# Patient Record
Sex: Female | Born: 1975 | Race: Black or African American | Hispanic: No | Smoking: Never smoker
Health system: Southern US, Community
[De-identification: ages and names within clinical notes are randomized; demographics above are authoritative.]

## PROBLEM LIST (undated history)

## (undated) DIAGNOSIS — M1990 Unspecified osteoarthritis, unspecified site: Secondary | ICD-10-CM

## (undated) DIAGNOSIS — F329 Major depressive disorder, single episode, unspecified: Secondary | ICD-10-CM

## (undated) DIAGNOSIS — A749 Chlamydial infection, unspecified: Principal | ICD-10-CM

## (undated) DIAGNOSIS — E785 Hyperlipidemia, unspecified: Secondary | ICD-10-CM

## (undated) DIAGNOSIS — I10 Essential (primary) hypertension: Secondary | ICD-10-CM

## (undated) DIAGNOSIS — F419 Anxiety disorder, unspecified: Secondary | ICD-10-CM

## (undated) DIAGNOSIS — E119 Type 2 diabetes mellitus without complications: Secondary | ICD-10-CM

## (undated) DIAGNOSIS — K219 Gastro-esophageal reflux disease without esophagitis: Secondary | ICD-10-CM

## (undated) DIAGNOSIS — F32A Depression, unspecified: Secondary | ICD-10-CM

## (undated) DIAGNOSIS — R51 Headache: Secondary | ICD-10-CM

## (undated) HISTORY — DX: Type 2 diabetes mellitus without complications: E11.9

## (undated) HISTORY — DX: Essential (primary) hypertension: I10

## (undated) HISTORY — PX: BREAST BIOPSY: SHX20

## (undated) HISTORY — PX: BREAST SURGERY: SHX581

## (undated) HISTORY — DX: Chlamydial infection, unspecified: A74.9

## (undated) HISTORY — DX: Hyperlipidemia, unspecified: E78.5

---

## 2001-03-20 ENCOUNTER — Other Ambulatory Visit: Admission: RE | Admit: 2001-03-20 | Discharge: 2001-03-20 | Payer: Self-pay | Admitting: Obstetrics and Gynecology

## 2001-09-10 ENCOUNTER — Ambulatory Visit (HOSPITAL_COMMUNITY): Admission: AD | Admit: 2001-09-10 | Discharge: 2001-09-10 | Payer: Self-pay | Admitting: Obstetrics and Gynecology

## 2001-09-17 ENCOUNTER — Ambulatory Visit (HOSPITAL_COMMUNITY): Admission: AD | Admit: 2001-09-17 | Discharge: 2001-09-17 | Payer: Self-pay | Admitting: Obstetrics and Gynecology

## 2001-09-18 ENCOUNTER — Inpatient Hospital Stay (HOSPITAL_COMMUNITY): Admission: AD | Admit: 2001-09-18 | Discharge: 2001-09-20 | Payer: Self-pay | Admitting: Obstetrics & Gynecology

## 2004-06-26 ENCOUNTER — Ambulatory Visit: Payer: Self-pay | Admitting: Family Medicine

## 2005-08-20 ENCOUNTER — Ambulatory Visit: Payer: Self-pay | Admitting: Family Medicine

## 2005-08-25 ENCOUNTER — Ambulatory Visit: Payer: Self-pay | Admitting: *Deleted

## 2005-09-20 ENCOUNTER — Ambulatory Visit: Payer: Self-pay | Admitting: Cardiology

## 2005-09-20 ENCOUNTER — Ambulatory Visit (HOSPITAL_COMMUNITY): Admission: RE | Admit: 2005-09-20 | Discharge: 2005-09-20 | Payer: Self-pay | Admitting: *Deleted

## 2006-06-29 ENCOUNTER — Other Ambulatory Visit: Admission: RE | Admit: 2006-06-29 | Discharge: 2006-06-29 | Payer: Self-pay | Admitting: Family Medicine

## 2006-06-29 ENCOUNTER — Ambulatory Visit: Payer: Self-pay | Admitting: Family Medicine

## 2006-06-29 ENCOUNTER — Encounter (INDEPENDENT_AMBULATORY_CARE_PROVIDER_SITE_OTHER): Payer: Self-pay | Admitting: Specialist

## 2006-06-30 ENCOUNTER — Encounter: Payer: Self-pay | Admitting: Family Medicine

## 2006-06-30 LAB — CONVERTED CEMR LAB
Candida species: NEGATIVE
Chlamydia, DNA Probe: NEGATIVE
GC Probe Amp, Genital: NEGATIVE
Gardnerella vaginalis: NEGATIVE
Trichomonal Vaginitis: NEGATIVE

## 2006-07-01 ENCOUNTER — Encounter: Payer: Self-pay | Admitting: Family Medicine

## 2006-07-01 LAB — CONVERTED CEMR LAB
ALT: 12 units/L (ref 0–35)
AST: 10 units/L (ref 0–37)
Albumin: 4 g/dL (ref 3.5–5.2)
Alkaline Phosphatase: 58 units/L (ref 39–117)
BUN: 10 mg/dL (ref 6–23)
Bilirubin, Direct: <0.1 mg/dL (ref 0.0–0.3)
CO2: 24 meq/L (ref 19–32)
Calcium: 9 mg/dL (ref 8.4–10.5)
Chloride: 103 meq/L (ref 96–112)
Cholesterol: 224 mg/dL — ABNORMAL HIGH (ref 0–200)
Creatinine, Ser: 0.61 mg/dL (ref 0.40–1.20)
Glucose, Bld: 124 mg/dL — ABNORMAL HIGH (ref 70–99)
HDL: 33 mg/dL — ABNORMAL LOW (ref >39)
Hgb A1c MFr Bld: 8.3 % — ABNORMAL HIGH (ref 4.6–6.1)
LDL Cholesterol: 170 mg/dL — ABNORMAL HIGH (ref 0–99)
Potassium: 4.6 meq/L (ref 3.5–5.3)
Sodium: 136 meq/L (ref 135–145)
Total Bilirubin: 0.4 mg/dL (ref 0.3–1.2)
Total CHOL/HDL Ratio: 6.8
Total Protein: 7.5 g/dL (ref 6.0–8.3)
Triglycerides: 103 mg/dL (ref <150)
VLDL: 21 mg/dL (ref 0–40)

## 2006-08-17 ENCOUNTER — Ambulatory Visit: Payer: Self-pay | Admitting: Family Medicine

## 2006-08-18 ENCOUNTER — Encounter: Payer: Self-pay | Admitting: Family Medicine

## 2006-11-01 ENCOUNTER — Ambulatory Visit: Payer: Self-pay | Admitting: Family Medicine

## 2007-05-11 ENCOUNTER — Encounter: Payer: Self-pay | Admitting: Family Medicine

## 2007-05-31 ENCOUNTER — Ambulatory Visit: Payer: Self-pay | Admitting: Family Medicine

## 2007-06-01 ENCOUNTER — Encounter: Payer: Self-pay | Admitting: Family Medicine

## 2007-06-01 LAB — CONVERTED CEMR LAB: Microalb, Ur: 0.81 mg/dL (ref 0.00–1.89)

## 2008-04-11 ENCOUNTER — Telehealth (INDEPENDENT_AMBULATORY_CARE_PROVIDER_SITE_OTHER): Payer: Self-pay | Admitting: *Deleted

## 2008-05-08 ENCOUNTER — Telehealth: Payer: Self-pay | Admitting: Family Medicine

## 2008-05-08 DIAGNOSIS — R5381 Other malaise: Secondary | ICD-10-CM | POA: Insufficient documentation

## 2008-05-08 DIAGNOSIS — R5383 Other fatigue: Secondary | ICD-10-CM

## 2008-05-14 ENCOUNTER — Ambulatory Visit: Payer: Self-pay | Admitting: Family Medicine

## 2008-05-14 DIAGNOSIS — E1165 Type 2 diabetes mellitus with hyperglycemia: Secondary | ICD-10-CM | POA: Insufficient documentation

## 2008-05-14 LAB — CONVERTED CEMR LAB
Glucose, Bld: 196 mg/dL
Hgb A1c MFr Bld: 11.1 %

## 2008-05-15 LAB — CONVERTED CEMR LAB
ALT: 14 units/L (ref 0–35)
AST: 11 units/L (ref 0–37)
Albumin: 4.1 g/dL (ref 3.5–5.2)
Alkaline Phosphatase: 62 units/L (ref 39–117)
BUN: 7 mg/dL (ref 6–23)
Basophils Absolute: 0 10*3/uL (ref 0.0–0.1)
Basophils Relative: 1 % (ref 0–1)
Bilirubin, Direct: 0.1 mg/dL (ref 0.0–0.3)
CO2: 22 meq/L (ref 19–32)
Calcium: 9.1 mg/dL (ref 8.4–10.5)
Chloride: 102 meq/L (ref 96–112)
Cholesterol: 279 mg/dL — ABNORMAL HIGH (ref 0–200)
Creatinine, Ser: 0.52 mg/dL (ref 0.40–1.20)
Creatinine, Urine: 107.7 mg/dL
Eosinophils Absolute: 0.1 10*3/uL (ref 0.0–0.7)
Eosinophils Relative: 1 % (ref 0–5)
Glucose, Bld: 225 mg/dL — ABNORMAL HIGH (ref 70–99)
HCT: 40 % (ref 36.0–46.0)
HDL: 35 mg/dL — ABNORMAL LOW (ref >39)
Hemoglobin: 13.1 g/dL (ref 12.0–15.0)
Indirect Bilirubin: 0.4 mg/dL (ref 0.0–0.9)
LDL Cholesterol: 210 mg/dL — ABNORMAL HIGH (ref 0–99)
Lymphocytes Relative: 32 % (ref 12–46)
Lymphs Abs: 2.1 10*3/uL (ref 0.7–4.0)
MCHC: 32.8 g/dL (ref 30.0–36.0)
MCV: 79.5 fL (ref 78.0–100.0)
Microalb Creat Ratio: 20.8 mg/g (ref 0.0–30.0)
Microalb, Ur: 2.24 mg/dL — ABNORMAL HIGH (ref 0.00–1.89)
Monocytes Absolute: 0.6 10*3/uL (ref 0.1–1.0)
Monocytes Relative: 9 % (ref 3–12)
Neutro Abs: 3.8 10*3/uL (ref 1.7–7.7)
Neutrophils Relative %: 58 % (ref 43–77)
Platelets: 320 10*3/uL (ref 150–400)
Potassium: 4.1 meq/L (ref 3.5–5.3)
RBC: 5.03 M/uL (ref 3.87–5.11)
RDW: 13.8 % (ref 11.5–15.5)
Sodium: 137 meq/L (ref 135–145)
Total Bilirubin: 0.5 mg/dL (ref 0.3–1.2)
Total CHOL/HDL Ratio: 8
Total Protein: 7.8 g/dL (ref 6.0–8.3)
Triglycerides: 170 mg/dL — ABNORMAL HIGH (ref <150)
VLDL: 34 mg/dL (ref 0–40)
WBC: 6.5 10*3/uL (ref 4.0–10.5)

## 2008-05-19 DIAGNOSIS — E785 Hyperlipidemia, unspecified: Secondary | ICD-10-CM | POA: Insufficient documentation

## 2008-05-19 DIAGNOSIS — I10 Essential (primary) hypertension: Secondary | ICD-10-CM | POA: Insufficient documentation

## 2008-06-25 ENCOUNTER — Encounter: Payer: Self-pay | Admitting: Family Medicine

## 2008-06-25 ENCOUNTER — Ambulatory Visit: Payer: Self-pay | Admitting: Family Medicine

## 2008-06-25 ENCOUNTER — Other Ambulatory Visit: Admission: RE | Admit: 2008-06-25 | Discharge: 2008-06-25 | Payer: Self-pay | Admitting: Family Medicine

## 2008-06-25 LAB — CONVERTED CEMR LAB: Glucose, Bld: 371 mg/dL

## 2008-07-16 ENCOUNTER — Encounter: Payer: Self-pay | Admitting: Family Medicine

## 2008-08-29 ENCOUNTER — Telehealth: Payer: Self-pay | Admitting: Family Medicine

## 2009-03-31 ENCOUNTER — Telehealth: Payer: Self-pay | Admitting: Family Medicine

## 2009-03-31 ENCOUNTER — Ambulatory Visit: Payer: Self-pay | Admitting: Family Medicine

## 2009-03-31 LAB — CONVERTED CEMR LAB: Beta hcg, urine, semiquantitative: NEGATIVE

## 2009-04-22 ENCOUNTER — Telehealth: Payer: Self-pay | Admitting: Family Medicine

## 2009-05-29 ENCOUNTER — Encounter: Payer: Self-pay | Admitting: Family Medicine

## 2009-07-09 ENCOUNTER — Other Ambulatory Visit: Admission: RE | Admit: 2009-07-09 | Discharge: 2009-07-09 | Payer: Self-pay | Admitting: Obstetrics and Gynecology

## 2009-07-09 ENCOUNTER — Encounter: Payer: Self-pay | Admitting: Family Medicine

## 2009-07-24 ENCOUNTER — Encounter: Payer: Self-pay | Admitting: Family Medicine

## 2009-08-18 ENCOUNTER — Telehealth: Payer: Self-pay | Admitting: Family Medicine

## 2009-08-25 ENCOUNTER — Ambulatory Visit: Payer: Self-pay | Admitting: Family Medicine

## 2009-08-25 DIAGNOSIS — F411 Generalized anxiety disorder: Secondary | ICD-10-CM | POA: Insufficient documentation

## 2009-08-25 DIAGNOSIS — R42 Dizziness and giddiness: Secondary | ICD-10-CM | POA: Insufficient documentation

## 2009-11-14 ENCOUNTER — Encounter: Payer: Self-pay | Admitting: Family Medicine

## 2010-01-11 ENCOUNTER — Inpatient Hospital Stay (HOSPITAL_COMMUNITY): Admission: AD | Admit: 2010-01-11 | Discharge: 2010-01-11 | Payer: Self-pay | Admitting: Obstetrics & Gynecology

## 2010-01-11 ENCOUNTER — Ambulatory Visit: Payer: Self-pay | Admitting: Advanced Practice Midwife

## 2010-05-16 ENCOUNTER — Observation Stay (HOSPITAL_COMMUNITY)
Admission: AD | Admit: 2010-05-16 | Discharge: 2010-05-17 | Payer: Self-pay | Source: Home / Self Care | Attending: Obstetrics and Gynecology | Admitting: Obstetrics and Gynecology

## 2010-05-17 ENCOUNTER — Inpatient Hospital Stay (HOSPITAL_COMMUNITY)
Admission: AD | Admit: 2010-05-17 | Discharge: 2010-05-20 | Payer: Self-pay | Source: Home / Self Care | Attending: Obstetrics & Gynecology | Admitting: Obstetrics & Gynecology

## 2010-05-25 LAB — CBC
HCT: 30.5 % — ABNORMAL LOW (ref 36.0–46.0)
HCT: 30.6 % — ABNORMAL LOW (ref 36.0–46.0)
HCT: 30.3 % — ABNORMAL LOW (ref 36.0–46.0)
Hemoglobin: 10.2 g/dL — ABNORMAL LOW (ref 12.0–15.0)
Hemoglobin: 10.4 g/dL — ABNORMAL LOW (ref 12.0–15.0)
Hemoglobin: 10.1 g/dL — ABNORMAL LOW (ref 12.0–15.0)
MCH: 26.2 pg (ref 26.0–34.0)
MCH: 26.6 pg (ref 26.0–34.0)
MCH: 26.5 pg (ref 26.0–34.0)
MCHC: 33.4 g/dL (ref 30.0–36.0)
MCHC: 34 g/dL (ref 30.0–36.0)
MCHC: 33.3 g/dL (ref 30.0–36.0)
MCV: 78.2 fL (ref 78.0–100.0)
MCV: 78.3 fL (ref 78.0–100.0)
MCV: 79.5 fL (ref 78.0–100.0)
Platelets: 314 10*3/uL (ref 150–400)
Platelets: 316 10*3/uL (ref 150–400)
Platelets: 318 10*3/uL (ref 150–400)
RBC: 3.9 MIL/uL (ref 3.87–5.11)
RBC: 3.91 MIL/uL (ref 3.87–5.11)
RBC: 3.81 MIL/uL — ABNORMAL LOW (ref 3.87–5.11)
RDW: 15.2 % (ref 11.5–15.5)
RDW: 15.3 % (ref 11.5–15.5)
RDW: 15.5 % (ref 11.5–15.5)
WBC: 11.6 10*3/uL — ABNORMAL HIGH (ref 4.0–10.5)
WBC: 8.3 10*3/uL (ref 4.0–10.5)
WBC: 11 10*3/uL — ABNORMAL HIGH (ref 4.0–10.5)

## 2010-05-25 LAB — GLUCOSE, CAPILLARY
Glucose-Capillary: 134 mg/dL — ABNORMAL HIGH (ref 70–99)
Glucose-Capillary: 134 mg/dL — ABNORMAL HIGH (ref 70–99)
Glucose-Capillary: 137 mg/dL — ABNORMAL HIGH (ref 70–99)
Glucose-Capillary: 160 mg/dL — ABNORMAL HIGH (ref 70–99)
Glucose-Capillary: 168 mg/dL — ABNORMAL HIGH (ref 70–99)
Glucose-Capillary: 186 mg/dL — ABNORMAL HIGH (ref 70–99)
Glucose-Capillary: 191 mg/dL — ABNORMAL HIGH (ref 70–99)
Glucose-Capillary: 196 mg/dL — ABNORMAL HIGH (ref 70–99)
Glucose-Capillary: 271 mg/dL — ABNORMAL HIGH (ref 70–99)
Glucose-Capillary: 77 mg/dL (ref 70–99)
Glucose-Capillary: 92 mg/dL (ref 70–99)
Glucose-Capillary: 93 mg/dL (ref 70–99)
Glucose-Capillary: 125 mg/dL — ABNORMAL HIGH (ref 70–99)

## 2010-05-25 LAB — RPR
RPR Ser Ql: NONREACTIVE
RPR Ser Ql: NONREACTIVE

## 2010-05-30 NOTE — Discharge Summary (Signed)
  NAMEVETRA, SHINALL NO.:  [id number]  MEDICAL RECORD NO.:  [id number]          PATIENT TYPE:  OBV  LOCATION:  9168                          FACILITY:  [facility]  PHYSICIAN:  Tilda Burrow, M.D. DATE OF BIRTH:  12-17-1975  DATE OF ADMISSION:  05/16/2010 DATE OF DISCHARGE:  05/18/2010                              DISCHARGE SUMMARY   ADMITTING DIAGNOSIS:  Pregnancy at 37 weeks 3 days, active labor, class B gestational diabetes mellitus on glyburide, group B strep negative status.  DISCHARGE DIAGNOSIS:  Pregnancy at 37 weeks 3 days, false labor.  HOSPITAL SUMMARY:  This 35 year old female was admitted on May 16, 2010, for a suspected labor with cervix 4 cm, 20%, posterior, and -2, shortly before midnight on the evening of May 16, 2010.  She had had fetal heart acceleration pattern that was reactive with the exception of a couple of areas that were interpreted initially as variable decelerations.  Her prenatal course has been followed at [facility] for prenatal management.  HOSPITAL COURSE:  The patient was admitted, had blood sugars in the 130 range with hemoglobin 10, hematocrit 30, platelets 316,000, RPR nonreactive.  She was admitted for labor management.  She was kept for a total of approximately 10 hours during which time the cervix did not change any further and the contractions spaced out.  Following morning, she was reexamined 3 hours apart by myself and had unchanged cervix. She was therefore discharged home for followup later when contractions had increased.  Routine discharge instructions include resume glyburide as previously ordered.  Follow up in 3 days for nonstress test at [facility].  ADDENDUM:  The patient went home, had progression of labor symptoms and returned later that same day in more active labor.     Tilda Burrow, M.D.     JVF/MEDQ  D:  05/28/2010  T:  05/29/2010  Job:  [id number]  cc:   [facility]  Electronically Signed by Christin Bach M.D. on 05/30/2010 06:56:19 AM

## 2010-06-09 NOTE — Letter (Signed)
Summary: history and physical  history and physical   Imported By: Curtis Sites 10/29/2009 11:44:23  _____________________________________________________________________  External Attachment:    Type:   Image     Comment:   External Document

## 2010-06-09 NOTE — Progress Notes (Signed)
Summary: PATIENT VISIT/MEDICATION SUMMARY [facility]  PATIENT VISIT/MEDICATION SUMMARY [facility]   Imported By: Lind Guest 11/18/2009 10:21:55  _____________________________________________________________________  External Attachment:    Type:   Image     Comment:   External Document

## 2010-06-09 NOTE — Progress Notes (Signed)
  Phone Note From Pharmacy   Caller: [facility]  [address] Summary of Call: refill request for metformin 1000 but not on med list, should patient still be on this? Initial call taken by: Adella Hare LPN,  August 18, 2009 4:51 PM  Follow-up for Phone Call        advise script sent in  Follow-up by: Syliva Overman MD,  August 18, 2009 5:05 PM    New/Updated Medications: METFORMIN HCL 1000 MG TABS (METFORMIN HCL) Take 1 tablet by mouth two times a day Prescriptions: METFORMIN HCL 1000 MG TABS (METFORMIN HCL) Take 1 tablet by mouth two times a day  #60 x 4   Entered and Authorized by:   Syliva Overman MD   Signed by:   Syliva Overman MD on 08/18/2009   Method used:   Electronically to        [facility]  [address] (retail)       [address]       [place], [place]  [address]       Ph: [phone]       Fax: [phone]   RxID:   [id number]

## 2010-06-09 NOTE — Letter (Signed)
Summary: misc.  misc.   Imported By: Curtis Sites 10/29/2009 11:44:54  _____________________________________________________________________  External Attachment:    Type:   Image     Comment:   External Document

## 2010-06-09 NOTE — Letter (Signed)
Summary: xray  xray   Imported By: Curtis Sites 10/29/2009 11:45:43  _____________________________________________________________________  External Attachment:    Type:   Image     Comment:   External Document

## 2010-06-09 NOTE — Progress Notes (Signed)
Summary: [facility]   Imported By: Lind Guest 07/17/2009 13:58:18  _____________________________________________________________________  External Attachment:    Type:   Image     Comment:   External Document

## 2010-06-09 NOTE — Letter (Signed)
Summary: consult  consult   Imported By: Curtis Sites 10/29/2009 11:43:28  _____________________________________________________________________  External Attachment:    Type:   Image     Comment:   External Document

## 2010-06-09 NOTE — Letter (Signed)
Summary: labs  labs   Imported By: Curtis Sites 10/29/2009 11:44:39  _____________________________________________________________________  External Attachment:    Type:   Image     Comment:   External Document

## 2010-06-09 NOTE — Progress Notes (Signed)
Summary: [facility]   Imported By: Lind Guest 07/30/2009 11:42:20  _____________________________________________________________________  External Attachment:    Type:   Image     Comment:   External Document

## 2010-06-09 NOTE — Letter (Signed)
Summary: phone notes  phone notes   Imported By: Curtis Sites 10/29/2009 11:45:09  _____________________________________________________________________  External Attachment:    Type:   Image     Comment:   External Document

## 2010-06-09 NOTE — Letter (Signed)
Summary: demographic  demographic   Imported By: Curtis Sites 10/29/2009 11:43:43  _____________________________________________________________________  External Attachment:    Type:   Image     Comment:   External Document

## 2010-06-09 NOTE — Assessment & Plan Note (Signed)
Summary: office visit   Vital Signs:  Patient profile:   35 year old female Height:      60 inches Weight:      134.25 pounds BMI:     26.31 O2 Sat:      98 % on Room air Pulse rate:   95 / minute BP sitting:   120 / 78 CC: needs a refill on her prescription, metformin Is Patient Diabetic? Yes Did you bring your meter with you today? Yes   CC:  needs a refill on her prescription and metformin.  History of Present Illness: Hx of DM.  Not taking medications as prescribed. No episodes of hypoglycemia. Not checking blood sugar at home regularly.  Did bring her meter today though & FBS range 80's to 130's. Last eye exam last summer No hx of Pneumovax. She states her last Hbg A1C with OB/GYN in March was 8.5.  This is lower than what it was previously, but she admits that she wasn't taking her meds regularly.  Has been out of Metformin for the last wk & needs this refilled today.  Pt had a miscarriage in Jan 2011.  Is seeing OB/GYN.  Wants to try to conceive again.  They changed her BP med to Norvasc.  BP has been controlled on it. No headache, chest pain or palpitations.  States she sometimes feels like she might pass out. States that she gets "a heaviness" that lasts for a few minutes then goes away.  No vertigo or dizziness, uncertain if lightheaded or not.  Doesn't notice with any certain activity, time of day,eating, change of positions etc. Has happened a few times since her miscarriage.  The first time she noticed it was after she took Cytotec for the miscarriage.  Allergies: No Known Drug Allergies  Past History:  Past medical, surgical, family and social histories (including risk factors) reviewed, and no changes noted (except as noted below).  Past Medical History: Diabetes mellitus, type II Hyperlipidemia Hypertension Spontaneous AB Jan 2011 Sickle cell trait Anxiety  Past Surgical History: Rt breast lumpectomy 2000  Family History: Reviewed history and no  changes required. Mom - DM, HTN, depression, Deceased 30 yo Dad - uncertain 1 brother - sickle cell disease 1 sister - L & W  Social History: Reviewed history and no changes required. Occupation: Charity fundraiser at [employer] Married Never Smoked Occupation:  employed  Review of Systems General:  Denies fatigue. Eyes:  Denies blurring and double vision. CV:  Complains of chest pain or discomfort and palpitations; CHEST DISCOMFORT & PALPITATIONS WITH ANXIETY.  HAS HAD NEG CST IN PAST FOR SAME SXS.Marland Kitchen Resp:  Denies shortness of breath.  Physical Exam  General:  Well-developed,well-nourished,in no acute distress; alert,appropriate and cooperative throughout examination Head:  Normocephalic and atraumatic without obvious abnormalities. No apparent alopecia or balding. Ears:  External ear exam shows no significant lesions or deformities.  Otoscopic examination reveals clear canals, tympanic membranes are intact bilaterally without bulging, retraction, inflammation or discharge. Hearing is grossly normal bilaterally. Nose:  External nasal examination shows no deformity or inflammation. Nasal mucosa are pink and moist without lesions or exudates. Mouth:  Oral mucosa and oropharynx without lesions or exudates.  Teeth in good repair. Neck:  No deformities, masses, or tenderness noted. Lungs:  Normal respiratory effort, chest expands symmetrically. Lungs are clear to auscultation, no crackles or wheezes. Heart:  Normal rate and regular rhythm. S1 and S2 normal without gallop, murmur, click, rub or other extra sounds. Cervical Nodes:  No lymphadenopathy noted Psych:  Oriented X3, normally interactive, good eye contact, and slightly anxious.     Impression & Recommendations:  Problem # 1:  DIABETES MELLITUS, TYPE II (ICD-250.00)  The following medications were removed from the medication list:    Lisinopril 20 Mg Tabs (Lisinopril) ..... One tab by mouth qd Her updated medication list for this problem  includes:    Glipizide 10 Mg Tabs (Glipizide) .Marland Kitchen... Take 1 tablet by mouth two times a day    Metformin Hcl 1000 Mg Tabs (Metformin hcl) .Marland Kitchen... Take 1 tablet by mouth two times a day  Orders: T-Comprehensive Metabolic Panel ([id number])  Problem # 2:  HYPERTENSION (ICD-401.9) Assessment: Comment Only  The following medications were removed from the medication list:    Lisinopril 20 Mg Tabs (Lisinopril) ..... One tab by mouth qd Her updated medication list for this problem includes:    Amlodipine Besylate 5 Mg Tabs (Amlodipine besylate) .Marland Kitchen... Take 1 daily  BP today: 120/78 Prior BP: 100/68 (06/25/2008)  Labs Reviewed: K+: 4.1 (05/14/2008) Creat: : 0.52 (05/14/2008)   Chol: 279 (05/14/2008)   HDL: 35 (05/14/2008)   LDL: 210 (05/14/2008)   TG: 170 (05/14/2008)  Orders: T-Comprehensive Metabolic Panel ([id number])  Problem # 3:  HYPERLIPIDEMIA (ICD-272.4) Assessment: Comment Only  Her updated medication list for this problem includes:    Lovastatin 40 Mg Tabs (Lovastatin) .Marland Kitchen... Take two tabs by mouth at bedtime  Labs Reviewed: SGOT: 11 (05/14/2008)   SGPT: 14 (05/14/2008)   HDL:35 (05/14/2008), 33 (07/01/2006)  LDL:210 (05/14/2008), 170 (07/01/2006)  Chol:279 (05/14/2008), 224 (07/01/2006)  Trig:170 (05/14/2008), 103 (07/01/2006)  Orders: T-Comprehensive Metabolic Panel ([id number]) T-Lipid Profile ([id number])  Problem # 4:  ANXIETY (ICD-300.00) Assessment: Unchanged  Complete Medication List: 1)  Lovastatin 40 Mg Tabs (Lovastatin) .... Take two tabs by mouth at bedtime 2)  Glipizide 10 Mg Tabs (Glipizide) .... Take 1 tablet by mouth two times a day 3)  Accu-chek Aviva Strp (Glucose blood) .... Uad 4)  Accu-chek Multiclix Lancets Misc (Lancets) .... Uad 5)  Metformin Hcl 1000 Mg Tabs (Metformin hcl) .... Take 1 tablet by mouth two times a day 6)  Amlodipine Besylate 5 Mg Tabs (Amlodipine besylate) .... Take 1 daily  Other Orders: T-CBC No Diff ([id number]) T-TSH  [phone]) T-Vitamin D (25-Hydroxy) [phone]) Pneumococcal Vaccine ([address]) Admin 1st Vaccine (30160)  Patient Instructions: 1)  Please schedule a follow-up appointment in 3 months. 2)  Check your blood sugars regularly. If your readings are usually above : or below 70 you should contact our office. Prescriptions: METFORMIN HCL 1000 MG TABS (METFORMIN HCL) Take 1 tablet by mouth two times a day  #60 x 3   Entered and Authorized by:   Esperanza Sheets PA   Signed by:   Esperanza Sheets PA on 08/25/2009   Method used:   Electronically to        [facility]  [address] (retail)       [address]       [place], [place]  [address]       Ph: [phone]       Fax: [phone]   RxID:   [id number]    Immunizations Administered:  Pneumonia Vaccine:    Vaccine Type: Pneumovax    Site: left deltoid    Mfr: Merck    Dose: 0.5 ml    Route: IM    Given by: Adella Hare LPN    Exp. Date: 06/05/2010  Lot #: [id number]    VIS given: 12/06/95 version given August 25, 2009.

## 2010-06-09 NOTE — Letter (Signed)
Summary: progress notes  progress notes   Imported By: Curtis Sites 10/29/2009 11:45:24  _____________________________________________________________________  External Attachment:    Type:   Image     Comment:   External Document

## 2010-06-09 NOTE — Progress Notes (Signed)
Summary: [facility]   Imported By: Lind Guest 07/15/2009 08:38:16  _____________________________________________________________________  External Attachment:    Type:   Image     Comment:   External Document

## 2010-06-09 NOTE — Assessment & Plan Note (Signed)
Summary: PER DR Jamal Pavon   Allergies: No Known Drug Allergies   Complete Medication List: 1)  Lisinopril 20 Mg Tabs (Lisinopril) .... One tab by mouth qd 2)  Lovastatin 40 Mg Tabs (Lovastatin) .... Take two tabs by mouth at bedtime 3)  Glipizide 10 Mg Tabs (Glipizide) .... Take 1 tablet by mouth two times a day 4)  Accu-chek Aviva Strp (Glucose blood) .... Uad 5)  Accu-chek Multiclix Lancets Misc (Lancets) .... Uad pt left without being seen, and rescheduled

## 2010-07-23 LAB — URINALYSIS, ROUTINE W REFLEX MICROSCOPIC
Bilirubin Urine: NEGATIVE
Glucose, UA: 250 mg/dL — AB
Hgb urine dipstick: NEGATIVE
Ketones, ur: NEGATIVE mg/dL
Nitrite: NEGATIVE
Protein, ur: NEGATIVE mg/dL
Specific Gravity, Urine: 1.02 (ref 1.005–1.030)
Urobilinogen, UA: 0.2 mg/dL (ref 0.0–1.0)
pH: 6 (ref 5.0–8.0)

## 2010-07-23 LAB — WET PREP, GENITAL
Clue Cells Wet Prep HPF POC: NONE SEEN
Trich, Wet Prep: NONE SEEN
Yeast Wet Prep HPF POC: NONE SEEN

## 2010-07-23 LAB — GC/CHLAMYDIA PROBE AMP, GENITAL
Chlamydia, DNA Probe: NEGATIVE
GC Probe Amp, Genital: NEGATIVE

## 2010-09-25 NOTE — Procedures (Signed)
NAMEPETINA, MURASKI NO.:  [id number]   MEDICAL RECORD NO.:  [id number]          PATIENT TYPE:  OUT   LOCATION:  RAD                           [facility]ILITY[facility]   PHYSICIAN:  [place] Bing, M.D. [facility] OF BIRTH:  1975-06-25   DATE OF PROCEDURE:  09/20/2005  DATE OF DISCHARGE:                                  ECHOCARDIOGRAM   REFERRING:  Dr. Lodema Hong and Dr. Dorethea Clan.   CLINICAL DATA:  A 35 year old woman with diabetes and chest pain.   1.  Treadmill exercise performed to a workload of 10 METs and a heart rate      of 172, 89% of age-predicted maximum.  Exercise discontinued due to      fatigue; no chest pain reported.  2.  Blood pressure increased from a resting value of 120/60 to 180/60 at      peak exercise, a normal response.  3.  No arrhythmias noted.  4.  Baseline EKG:  Normal sinus rhythm; prominent voltage; slightly delayed      R-wave progression; minimal nonspecific T-wave abnormality.  5.  Exercise EKG:  Interpretation impaired by artifact; only insignificant      ST-segment depression identified.  6.  Resting echocardiogram:  Normal left ventricular size; normal regional      and global function; no valvular abnormalities.  7.  Post-stress echocardiogram:  Normal to hyperdynamic function in all      myocardial segments.   IMPRESSION:  Negative stress echocardiogram revealing adequate exercise  tolerance, a normal stress EKG, normal left ventricular size and normal  augmentation in left ventricular systolic function following exercise.  Other findings as noted.       Bing, M.D. [facility]  Electronically Signed     RR/MEDQ  D:  09/20/2005  T:  09/21/2005  Job:  [id number]

## 2010-09-25 NOTE — Op Note (Signed)
[facility], [place]  Patient:    CING, [place] Visit Number: [id number] MRN: [id number]          Service Type: OBS Location: 4A A416 01 Attending Physician:  Lazaro Arms Dictated by:   Zerita Boers, C.N.M. Proc. Date: 09/18/01 Admit Date:  09/18/2001   CC:         [facility]   Operative Report  ONSET OF LABOR:  Sep 18, 2001 at 12 p.m.  DATE OF DELIVERY:  Sep 18, 2001 at 2:31 p.m.  LENGTH OF FIRST STAGE LABOR:  Two hours and 16 minutes.  LENGTH OF SECOND STAGE LABOR:  Sixteen minutes.  LENGTH OF THIRD STAGE LABOR:  Four minutes.  DELIVERY NOTE:  Wandra Mannan had a spontaneous vaginal delivery of a viable female child, weighing 5 pounds 2 ounces.  Upon delivery of head an occult arm was noted, which was reduced, and delivery of the posterior shoulder was accomplished without any difficulty, with spontaneous delivery of anterior shoulder and the rest of the fetal body without any complications.  Upon delivery the infant was alert, vigorous, had good tone.  Nose and mouth were thoroughly suctioned with a bulb syringe and vigorous cry was noted.  The cord was clamped and cut and the infant placed on the mothers abdomen in good condition.  Upon inspection intact perineum was noted.  Third stage of labor was actively managed with 20 units of Pitocin at 1000 cc with D-5 LR at a rapid rate.  Estimated blood loss was approximately 250 cc.  The placenta was delivered spontaneously via Schultze mechanism, three-vessel cord.  Infant and mother both stabilized and transferred out to the postpartum unit in stable condition. Dictated by:   Zerita Boers, C.N.M. Attending Physician:  Lazaro Arms DD:  09/18/01 TD:  09/18/01 Job: [id number]

## 2010-09-25 NOTE — Procedures (Signed)
NAMEHOPELYNN, GARTLAND NO.:  [id number]   MEDICAL RECORD NO.:  [id number]          PATIENT TYPE:  OUT   LOCATION:  RAD                           [facility]ILITY[facility]   PHYSICIAN:  [place] Bing, M.D. [facility] OF BIRTH:  July 09, 1975   DATE OF PROCEDURE:  09/20/2005  DATE OF DISCHARGE:                                  ECHOCARDIOGRAM   PROCEDURE:  Echocardiogram.   CARDIOLOGIST:  [place] Bing, M.D.   CLINICAL DATA:  A 36 year old woman with diabetes and chest pain.   MEASUREMENTS:  M-mode aorta 2.5, left atrium 3.9, septum 1.2, posterior wall  1.0, LV diastole 4.5, LV systole 3.1.   IMPRESSION:  1.  Technically adequate echocardiographic study.  2.  Normal left atrium, right atrium and right ventricle.  3.  Normal aortic, mitral, tricuspid and pulmonic valve; normal proximal      pulmonary artery.  4.  Normal Doppler study with physiologic tricuspid regurgitation and normal      estimated right ventricular systolic pressure.  5.  Normal internal dimension, wall thickness, regional and global function      of the left ventricle.  6.  Normal inferior vena cava.      [place] Bing, M.D. [facility]  Electronically Signed     RR/MEDQ  D:  09/20/2005  T:  09/21/2005  Job:  [id number]

## 2011-06-16 ENCOUNTER — Ambulatory Visit (INDEPENDENT_AMBULATORY_CARE_PROVIDER_SITE_OTHER): Payer: 59 | Admitting: Family Medicine

## 2011-06-16 ENCOUNTER — Encounter: Payer: Self-pay | Admitting: Family Medicine

## 2011-06-16 VITALS — BP 140/90 | HR 82 | Resp 18 | Ht 60.0 in | Wt 127.0 lb

## 2011-06-16 DIAGNOSIS — R1013 Epigastric pain: Secondary | ICD-10-CM | POA: Insufficient documentation

## 2011-06-16 DIAGNOSIS — E119 Type 2 diabetes mellitus without complications: Secondary | ICD-10-CM

## 2011-06-16 DIAGNOSIS — E559 Vitamin D deficiency, unspecified: Secondary | ICD-10-CM

## 2011-06-16 DIAGNOSIS — E785 Hyperlipidemia, unspecified: Secondary | ICD-10-CM

## 2011-06-16 DIAGNOSIS — I10 Essential (primary) hypertension: Secondary | ICD-10-CM

## 2011-06-16 DIAGNOSIS — F411 Generalized anxiety disorder: Secondary | ICD-10-CM

## 2011-06-16 DIAGNOSIS — K3189 Other diseases of stomach and duodenum: Secondary | ICD-10-CM

## 2011-06-16 DIAGNOSIS — N760 Acute vaginitis: Secondary | ICD-10-CM

## 2011-06-16 DIAGNOSIS — N3090 Cystitis, unspecified without hematuria: Secondary | ICD-10-CM

## 2011-06-16 DIAGNOSIS — N3000 Acute cystitis without hematuria: Secondary | ICD-10-CM

## 2011-06-16 LAB — POCT URINALYSIS DIPSTICK
Bilirubin, UA: NEGATIVE
Blood, UA: NEGATIVE
Glucose, UA: NEGATIVE
Ketones, UA: NEGATIVE
Leukocytes, UA: NEGATIVE
Nitrite, UA: NEGATIVE
Protein, UA: NEGATIVE
Spec Grav, UA: 1.02
Urobilinogen, UA: 0.2
pH, UA: 6

## 2011-06-16 MED ORDER — LISINOPRIL 10 MG PO TABS: 10.0000 mg | ORAL_TABLET | Freq: Every day | ORAL | Status: DC

## 2011-06-16 NOTE — Assessment & Plan Note (Signed)
Reports increased anxiety, check TSH

## 2011-06-16 NOTE — Progress Notes (Signed)
  Subjective:    Patient ID: [name] Fernandez, female    DOB: 11-24-1975, 36 y.o.   MRN: [id number]  HPI The PT is here for follow up and re-evaluation of chronic medical conditions, medication management and review of any available recent lab and radiology data.  Preventive health is updated, specifically  Cancer screening and Immunization.   4 week h/o recurrent abdominal pain and nausea, feels as though she will pass out at imes, pain radiates to mid back. Has not been taking her regular blood pressure or blood sugar medication. 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 C/o suprapubic discomfort and increased malodorous vaginal d/c x 1 week  Review of Systems See HPI Denies recent fever or chills. Denies sinus pressure, nasal congestion, ear pain or sore throat. Denies chest congestion, productive cough or wheezing. Denies chest pains, palpitations and leg swelling Denies diarrhea or constipation.   Denies  hesitancy or incontinence. Denies joint pain, swelling and limitation in mobility. Denies headaches, seizures, numbness, or tingling.  Denies skin break down or rash.        Objective:   Physical Exam Patient alert and oriented and in no cardiopulmonary distress.  HEENT: No facial asymmetry, EOMI, no sinus tenderness,  oropharynx pink and moist.  Neck supple no adenopathy.  Chest: Clear to auscultation bilaterally.  CVS: S1, S2 no murmurs, no S3.  ABD: Soft , suprapubic tenderness. Bowel sounds normal. Genital: white  vag d/c , no adnexal or cervical motion tenderness Ext: No edema  MS: Adequate ROM spine, shoulders, hips and knees.  Skin: Intact, no ulcerations or rash noted.  Psych: Good eye contact, normal affect. Memory intact mildly  anxious not depressed appearing.  CNS: CN 2-12 intact, power, tone and sensation normal throughout.          Assessment & Plan:

## 2011-06-16 NOTE — Assessment & Plan Note (Signed)
Need fasting labs.

## 2011-06-16 NOTE — Assessment & Plan Note (Signed)
Uncontrolled, will treat based on labs

## 2011-06-16 NOTE — Patient Instructions (Signed)
F/u in 2 month  Labs as soon as possible after 8 hour fast is fine.  Med sent in for blood pressure and kidney protection.   You are referred for an [place] of your gall bladder to eval fior gallstones  Goal for fasting blood sugar ranges from 80 to 120 and 2 hours after any meal or at bedtime should be between 130 to 170.   Test once daily please

## 2011-06-16 NOTE — Assessment & Plan Note (Signed)
Uncontroled, resume meds

## 2011-06-17 DIAGNOSIS — N760 Acute vaginitis: Secondary | ICD-10-CM | POA: Insufficient documentation

## 2011-06-17 HISTORY — DX: Acute vaginitis: N76.0

## 2011-06-17 LAB — LIPID PANEL
Cholesterol: 236 mg/dL — ABNORMAL HIGH (ref 0–200)
VLDL: 33 mg/dL (ref 0–40)

## 2011-06-17 LAB — GC/CHLAMYDIA PROBE AMP, GENITAL
Chlamydia, DNA Probe: NEGATIVE
GC Probe Amp, Genital: NEGATIVE

## 2011-06-17 LAB — CBC
HCT: 38.4 % (ref 36.0–46.0)
Hemoglobin: 13 g/dL (ref 12.0–15.0)
MCV: 80.5 fL (ref 78.0–100.0)
RBC: 4.77 MIL/uL (ref 3.87–5.11)
WBC: 6 10*3/uL (ref 4.0–10.5)

## 2011-06-17 LAB — MICROALBUMIN / CREATININE URINE RATIO
Creatinine, Urine: 93.9 mg/dL
Microalb Creat Ratio: 45.2 mg/g — ABNORMAL HIGH (ref 0.0–30.0)
Microalb, Ur: 4.24 mg/dL — ABNORMAL HIGH (ref 0.00–1.89)

## 2011-06-17 LAB — WET PREP BY MOLECULAR PROBE: Candida species: POSITIVE — AB

## 2011-06-17 MED ORDER — FLUCONAZOLE 150 MG PO TABS: ORAL_TABLET | ORAL | Status: AC

## 2011-06-17 MED ORDER — METRONIDAZOLE 500 MG PO TABS: 500.0000 mg | ORAL_TABLET | Freq: Two times a day (BID) | ORAL | Status: AC

## 2011-06-18 ENCOUNTER — Telehealth: Payer: Self-pay | Admitting: Family Medicine

## 2011-06-18 ENCOUNTER — Other Ambulatory Visit: Payer: Self-pay | Admitting: Family Medicine

## 2011-06-18 DIAGNOSIS — E559 Vitamin D deficiency, unspecified: Secondary | ICD-10-CM | POA: Insufficient documentation

## 2011-06-18 LAB — COMPLETE METABOLIC PANEL WITH GFR
AST: 12 U/L (ref 0–37)
Albumin: 4.2 g/dL (ref 3.5–5.2)
BUN: 9 mg/dL (ref 6–23)
Calcium: 9.5 mg/dL (ref 8.4–10.5)
Chloride: 103 mEq/L (ref 96–112)
Glucose, Bld: 153 mg/dL — ABNORMAL HIGH (ref 70–99)
Potassium: 4.7 mEq/L (ref 3.5–5.3)
Sodium: 139 mEq/L (ref 135–145)
Total Protein: 7.5 g/dL (ref 6.0–8.3)

## 2011-06-18 LAB — HEMOGLOBIN A1C
Hgb A1c MFr Bld: 7.8 % — ABNORMAL HIGH (ref <5.7)
Mean Plasma Glucose: 177 mg/dL — ABNORMAL HIGH (ref <117)

## 2011-06-18 LAB — TSH: TSH: 1.225 u[IU]/mL (ref 0.350–4.500)

## 2011-06-18 LAB — VITAMIN D 25 HYDROXY (VIT D DEFICIENCY, FRACTURES): Vit D, 25-Hydroxy: 18 ng/mL — ABNORMAL LOW (ref 30–89)

## 2011-06-18 MED ORDER — PRAVASTATIN SODIUM 40 MG PO TABS: 40.0000 mg | ORAL_TABLET | Freq: Every evening | ORAL | Status: DC

## 2011-06-18 MED ORDER — SITAGLIPTIN PHOS-METFORMIN HCL 50-500 MG PO TABS: 1.0000 | ORAL_TABLET | Freq: Two times a day (BID) | ORAL | Status: DC

## 2011-06-18 MED ORDER — AMOXICILL-CLARITHRO-LANSOPRAZ PO MISC: Freq: Two times a day (BID) | ORAL | Status: DC

## 2011-06-18 MED ORDER — VITAMIN D3 1.25 MG (50000 UT) PO CAPS: 50000.0000 [IU] | ORAL_CAPSULE | ORAL | Status: DC

## 2011-06-18 NOTE — Telephone Encounter (Signed)
Pt does not need to fill flagyll separately, it is in the prevpac, pls send d/c note to the pharmacy and also explain to the pt when you do spk to her, thanks

## 2011-06-21 ENCOUNTER — Telehealth: Payer: Self-pay | Admitting: Family Medicine

## 2011-06-21 ENCOUNTER — Ambulatory Visit (HOSPITAL_COMMUNITY): Payer: 59

## 2011-06-21 NOTE — Telephone Encounter (Signed)
Pt aware.

## 2011-06-21 NOTE — Telephone Encounter (Signed)
Pt aware of new meds and lab results.

## 2011-06-23 ENCOUNTER — Ambulatory Visit (INDEPENDENT_AMBULATORY_CARE_PROVIDER_SITE_OTHER): Payer: 59

## 2011-06-23 VITALS — BP 130/74 | Wt 128.0 lb

## 2011-06-23 DIAGNOSIS — Z23 Encounter for immunization: Secondary | ICD-10-CM

## 2011-06-23 NOTE — Progress Notes (Signed)
Pt in for tdap immunization.  Given in left deltoid.  No signs or symptoms of adverse reaction. No voiced complaints.

## 2011-06-26 ENCOUNTER — Telehealth: Payer: Self-pay | Admitting: Family Medicine

## 2011-06-26 DIAGNOSIS — E119 Type 2 diabetes mellitus without complications: Secondary | ICD-10-CM

## 2011-06-26 MED ORDER — SITAGLIPTIN PHOS-METFORMIN HCL 50-500 MG PO TABS: 1.0000 | ORAL_TABLET | Freq: Two times a day (BID) | ORAL | Status: DC

## 2011-06-26 MED ORDER — LANSOPRAZOLE 30 MG PO CPDR: 30.0000 mg | DELAYED_RELEASE_CAPSULE | Freq: Two times a day (BID) | ORAL | Status: DC

## 2011-06-26 MED ORDER — AMOXICILLIN 500 MG PO CAPS: 1000.0000 mg | ORAL_CAPSULE | Freq: Two times a day (BID) | ORAL | Status: AC

## 2011-06-26 MED ORDER — CLARITHROMYCIN 500 MG PO TABS: 500.0000 mg | ORAL_TABLET | Freq: Two times a day (BID) | ORAL | Status: AC

## 2011-06-26 NOTE — Telephone Encounter (Signed)
Pt unable to afford prevpak, will dispense separtely

## 2011-06-27 DIAGNOSIS — N3000 Acute cystitis without hematuria: Secondary | ICD-10-CM | POA: Insufficient documentation

## 2011-06-27 NOTE — Assessment & Plan Note (Signed)
Labs after visit show marked deficiency, pt aware and will start weekly meds

## 2011-06-27 NOTE — Assessment & Plan Note (Signed)
ccua abnormal and specimen sent for c/s

## 2011-06-27 NOTE — Assessment & Plan Note (Signed)
Specimen sent will treat based on lab

## 2011-06-30 ENCOUNTER — Telehealth: Payer: Self-pay | Admitting: Family Medicine

## 2011-07-02 ENCOUNTER — Other Ambulatory Visit: Payer: Self-pay

## 2011-07-02 ENCOUNTER — Emergency Department (HOSPITAL_COMMUNITY)
Admission: EM | Admit: 2011-07-02 | Discharge: 2011-07-02 | Disposition: A | Discharge status: Home / Self Care | Payer: 59 | Attending: Emergency Medicine | Admitting: Emergency Medicine

## 2011-07-02 ENCOUNTER — Emergency Department (HOSPITAL_COMMUNITY): Payer: 59

## 2011-07-02 ENCOUNTER — Encounter (HOSPITAL_COMMUNITY): Payer: Self-pay | Admitting: Emergency Medicine

## 2011-07-02 DIAGNOSIS — R55 Syncope and collapse: Secondary | ICD-10-CM | POA: Insufficient documentation

## 2011-07-02 DIAGNOSIS — I10 Essential (primary) hypertension: Secondary | ICD-10-CM | POA: Insufficient documentation

## 2011-07-02 DIAGNOSIS — E119 Type 2 diabetes mellitus without complications: Secondary | ICD-10-CM | POA: Insufficient documentation

## 2011-07-02 DIAGNOSIS — E785 Hyperlipidemia, unspecified: Secondary | ICD-10-CM | POA: Insufficient documentation

## 2011-07-02 DIAGNOSIS — R5381 Other malaise: Secondary | ICD-10-CM | POA: Insufficient documentation

## 2011-07-02 LAB — DIFFERENTIAL
Basophils Absolute: 0 10*3/uL (ref 0.0–0.1)
Basophils Relative: 0 % (ref 0–1)
Eosinophils Absolute: 0.1 10*3/uL (ref 0.0–0.7)
Monocytes Relative: 9 % (ref 3–12)
Neutro Abs: 5.8 10*3/uL (ref 1.7–7.7)
Neutrophils Relative %: 70 % (ref 43–77)

## 2011-07-02 LAB — URINALYSIS, ROUTINE W REFLEX MICROSCOPIC
Leukocytes, UA: NEGATIVE
Nitrite: NEGATIVE
Protein, ur: NEGATIVE mg/dL
Specific Gravity, Urine: 1.025 (ref 1.005–1.030)
Urobilinogen, UA: 0.2 mg/dL (ref 0.0–1.0)

## 2011-07-02 LAB — CBC
Hemoglobin: 11.9 g/dL — ABNORMAL LOW (ref 12.0–15.0)
MCH: 27.7 pg (ref 26.0–34.0)
MCHC: 34.6 g/dL (ref 30.0–36.0)
Platelets: 311 10*3/uL (ref 150–400)

## 2011-07-02 LAB — BASIC METABOLIC PANEL
BUN: 10 mg/dL (ref 6–23)
Calcium: 9.2 mg/dL (ref 8.4–10.5)
GFR calc Af Amer: >90 mL/min (ref >90)
GFR calc non Af Amer: >90 mL/min (ref >90)
Glucose, Bld: 94 mg/dL (ref 70–99)
Potassium: 3.7 mEq/L (ref 3.5–5.1)
Sodium: 138 mEq/L (ref 135–145)

## 2011-07-02 LAB — LIPASE, BLOOD: Lipase: 44 U/L (ref 11–59)

## 2011-07-02 LAB — GLUCOSE, CAPILLARY: Glucose-Capillary: 116 mg/dL — ABNORMAL HIGH (ref 70–99)

## 2011-07-02 LAB — HEPATIC FUNCTION PANEL
AST: 13 U/L (ref 0–37)
Bilirubin, Direct: 0.1 mg/dL (ref 0.0–0.3)
Indirect Bilirubin: 0.2 mg/dL — ABNORMAL LOW (ref 0.3–0.9)
Total Bilirubin: 0.3 mg/dL (ref 0.3–1.2)

## 2011-07-02 LAB — PROTIME-INR
INR: 1.06 (ref 0.00–1.49)
Prothrombin Time: 14 seconds (ref 11.6–15.2)

## 2011-07-02 LAB — D-DIMER, QUANTITATIVE: D-Dimer, Quant: <0.22 ug/mL-FEU (ref 0.00–0.48)

## 2011-07-02 LAB — PREGNANCY, URINE: Preg Test, Ur: NEGATIVE

## 2011-07-02 MED ORDER — SODIUM CHLORIDE 0.9 % IV BOLUS (SEPSIS)
500.0000 mL | Freq: Once | INTRAVENOUS | Status: AC
  Administered 2011-07-02: 15:00:00 via INTRAVENOUS

## 2011-07-02 MED ORDER — SODIUM CHLORIDE 0.9 % IV SOLN: Freq: Once | INTRAVENOUS | Status: DC

## 2011-07-02 MED ORDER — GLYBURIDE 5 MG PO TABS: 5.0000 mg | ORAL_TABLET | Freq: Two times a day (BID) | ORAL | Status: DC

## 2011-07-02 NOTE — Discharge Instructions (Signed)
Near-Syncope Near-syncope is sudden weakness, dizziness, or feeling like you might pass out (faint). This may occur when getting up after sitting or while standing for a long period of time. Near-syncope can be caused by a drop in blood pressure. This is a common reaction, but it may occur to a greater degree in people taking medicines to control their blood pressure. Fainting often occurs when the blood pressure or pulse is too low to provide enough blood flow to the brain to keep you conscious. Fainting and near-syncope are not usually due to serious medical problems. However, certain people should be more cautious in the event of near-syncope, including elderly patients, patients with diabetes, and patients with a history of heart conditions (especially irregular rhythms).  CAUSES   Drop in blood pressure.   Physical pain.   Dehydration.   Heat exhaustion.   Emotional distress.   Low blood sugar.   Internal bleeding.   Heart and circulatory problems.   Infections.  SYMPTOMS   Dizziness.   Feeling sick to your stomach (nauseous).   Nearly fainting.   Body numbness.   Turning pale.   Tunnel vision.   Weakness.  HOME CARE INSTRUCTIONS   Lie down right away if you start feeling like you might faint. Breathe deeply and steadily. Wait until all the symptoms have passed. Most of these episodes last only a few minutes. You may feel tired for several hours.   Drink enough fluids to keep your urine clear or pale yellow.   If you are taking blood pressure or heart medicine, get up slowly, taking several minutes to sit and then stand. This can reduce dizziness that is caused by a drop in blood pressure.  SEEK IMMEDIATE MEDICAL CARE IF:   You have a severe headache.   Unusual pain develops in the chest, abdomen, or back.   There is bleeding from the mouth or rectum, or you have black or tarry stool.   An irregular heartbeat or a very rapid pulse develops.   You have  repeated fainting or seizure-like jerking during an episode.   You faint when sitting or lying down.   You develop confusion.   You have difficulty walking.   Severe weakness develops.   Vision problems develop.  MAKE SURE YOU:   Understand these instructions.   Will watch your condition.   Will get help right away if you are not doing well or get worse.  Document Released: 04/26/2005 Document Revised: 01/06/2011 Document Reviewed: 06/12/2010 [facility] Patient Information 2012 [place], [place].  Workup in the emergency department negative for any specific findings may be related to a hypoglycemic episode even though the lowest blood sugar result was 65. Extensive workup without any significant abnormalities. Okay to go home. We'll provide work note for the next 3 days. Followup with your primary care doctor in the next 2 days if symptoms persist will need additional workup.

## 2011-07-02 NOTE — ED Provider Notes (Signed)
History   This chart was scribed for [name] Jakes, MD by [name]. The patient was seen in room APA17/APA17. Patient's care was started at 1219.  CSN: [id number]  Arrival date & time 07/02/11  1219   First MD Initiated Contact with Patient 07/02/11 1257      Chief Complaint  Patient presents with  . Weakness    (Consider location/radiation/quality/duration/timing/severity/associated sxs/prior treatment) HPI [name] Fernandez is a 36 y.o. female who presents to the Emergency Department to be evaluated following 2 episodes of near syncope this morning with moderate to severe generalized weakness while at work as an [employer] fund[employer]. Denies LOC, head injury, neck pain, chest pain, abdominal pain, back pain, dysuria, rash, sore throat, congestion, SOB, and swelling of lower extremities. Patient notes no previous history of similar symptoms. Patient states she was recently evaluated by Dr. Lodema Hong for abdominal pain and was dx with H. Pylori and placed on abx.  PCP- Lodema Hong  Past Medical History  Diagnosis Date  . Hyperlipidemia   . Hypertension   . Diabetes mellitus     Past Surgical History  Procedure Date  . Breast surgery     right lumpectomy, benign    History reviewed. No pertinent family history.  History  Substance Use Topics  . Smoking status: Never Smoker   . Smokeless tobacco: Not on file  . Alcohol Use: No    OB History    Grav Para Term Preterm Abortions TAB SAB Ect Mult Living                  Review of Systems  Constitutional: Negative for fever.  HENT: Negative for rhinorrhea.   Eyes: Negative for pain.  Respiratory: Negative for cough and shortness of breath.   Cardiovascular: Negative for chest pain.  Gastrointestinal: Negative for nausea, vomiting, abdominal pain and diarrhea.  Genitourinary: Negative for dysuria.  Musculoskeletal: Negative for back pain.  Skin: Negative for rash.  Neurological: Positive for weakness. Negative for headaches.   Near syncope.     Allergies  Review of patient's allergies indicates no known allergies.  Home Medications   Current Outpatient Rx  Name Route Sig Dispense Refill  . AMOXICILLIN 500 MG PO CAPS Oral Take 2 capsules (1,000 mg total) by mouth 2 (two) times daily. 56 capsule 0  . VITAMIN D3 50000 UNITS PO CAPS Oral Take 50,000 Units by mouth once a week. 12 capsule 1  . CLARITHROMYCIN 500 MG PO TABS Oral Take 1 tablet (500 mg total) by mouth 2 (two) times daily. 28 tablet 0  . GLYBURIDE 5 MG PO TABS Oral Take 1 tablet (5 mg total) by mouth 2 (two) times daily with a meal. 60 tablet 4  . LANSOPRAZOLE 30 MG PO CPDR Oral Take 1 capsule (30 mg total) by mouth 2 (two) times daily. 28 capsule 0  . LISINOPRIL 10 MG PO TABS Oral Take 1 tablet (10 mg total) by mouth daily. 30 tablet 4  . PRAVASTATIN SODIUM 40 MG PO TABS Oral Take 1 tablet (40 mg total) by mouth every evening. 30 tablet 11  . SITAGLIPTIN-METFORMIN HCL 50-500 MG PO TABS Oral Take 1 tablet by mouth 2 (two) times daily with a meal. 60 tablet 3    BP 105/64  Pulse 80  Temp 98.4 F (36.9 C)  Resp 17  Ht 5' (1.524 m)  Wt 124 lb (56.246 kg)  BMI 24.22 kg/m2  SpO2 99%  LMP 06/08/2011  Physical Exam  Nursing note and  vitals reviewed. Constitutional: She is oriented to person, place, and time. She appears well-developed and well-nourished. No distress.  HENT:  Head: Normocephalic and atraumatic.  Eyes: EOM are normal. Pupils are equal, round, and reactive to light.  Neck: Neck supple. No tracheal deviation present.  Cardiovascular: Normal rate and regular rhythm.  Exam reveals no gallop and no friction rub.   No murmur heard.      DP pulses 2+ bilaterally.   Pulmonary/Chest: Effort normal. No respiratory distress. She has no wheezes. She has no rales.  Abdominal: Soft. Bowel sounds are normal. She exhibits no distension. There is no tenderness.  Musculoskeletal: Normal range of motion. She exhibits no edema.  Lymphadenopathy:     She has no cervical adenopathy.  Neurological: She is alert and oriented to person, place, and time. No cranial nerve deficit or sensory deficit.       Distal neurovascular intact. Drowsy  Skin: Skin is warm and dry.  Psychiatric: She has a normal mood and affect. Her behavior is normal.    ED Course  Procedures (including critical care time)  DIAGNOSTIC STUDIES: Oxygen Saturation is 99% on room air, normal by my interpretation.    COORDINATION OF CARE: 1:21PM-Patient informed of current plan for treatment and evaluation and agrees with plan at this time.  2:12PM- Patient informed of current lab and imaging results.    Results for orders placed during the hospital encounter of 07/02/11  GLUCOSE, CAPILLARY      Component Value Range   Glucose-Capillary 116 (*) 70 - 99 (mg/dL)  BASIC METABOLIC PANEL      Component Value Range   Sodium 138  135 - 145 (mEq/L)   Potassium 3.7  3.5 - 5.1 (mEq/L)   Chloride 103  96 - 112 (mEq/L)   CO2 25  19 - 32 (mEq/L)   Glucose, Bld 94  70 - 99 (mg/dL)   BUN 10  6 - 23 (mg/dL)   Creatinine, Ser 1.61  0.50 - 1.10 (mg/dL)   Calcium 9.2  8.4 - 09.6 (mg/dL)   GFR calc non Af Amer >90  >90 (mL/min)   GFR calc Af Amer >90  >90 (mL/min)  CBC      Component Value Range   WBC 8.2  4.0 - 10.5 (K/uL)   RBC 4.30  3.87 - 5.11 (MIL/uL)   Hemoglobin 11.9 (*) 12.0 - 15.0 (g/dL)   HCT 04.5 (*) 40.9 - 46.0 (%)   MCV 80.0  78.0 - 100.0 (fL)   MCH 27.7  26.0 - 34.0 (pg)   MCHC 34.6  30.0 - 36.0 (g/dL)   RDW 81.1  91.4 - 78.2 (%)   Platelets 311  150 - 400 (K/uL)  DIFFERENTIAL      Component Value Range   Neutrophils Relative 70  43 - 77 (%)   Neutro Abs 5.8  1.7 - 7.7 (K/uL)   Lymphocytes Relative 20  12 - 46 (%)   Lymphs Abs 1.6  0.7 - 4.0 (K/uL)   Monocytes Relative 9  3 - 12 (%)   Monocytes Absolute 0.8  0.1 - 1.0 (K/uL)   Eosinophils Relative 1  0 - 5 (%)   Eosinophils Absolute 0.1  0.0 - 0.7 (K/uL)   Basophils Relative 0  0 - 1 (%)   Basophils  Absolute 0.0  0.0 - 0.1 (K/uL)  URINALYSIS, ROUTINE W REFLEX MICROSCOPIC      Component Value Range   Color, Urine YELLOW  YELLOW  APPearance CLEAR  CLEAR    Specific Gravity, Urine 1.025  1.005 - 1.030    pH 6.0  5.0 - 8.0    Glucose, UA NEGATIVE  NEGATIVE (mg/dL)   Hgb urine dipstick NEGATIVE  NEGATIVE    Bilirubin Urine NEGATIVE  NEGATIVE    Ketones, ur NEGATIVE  NEGATIVE (mg/dL)   Protein, ur NEGATIVE  NEGATIVE (mg/dL)   Urobilinogen, UA 0.2  0.0 - 1.0 (mg/dL)   Nitrite NEGATIVE  NEGATIVE    Leukocytes, UA NEGATIVE  NEGATIVE   PREGNANCY, URINE      Component Value Range   Preg Test, Ur NEGATIVE  NEGATIVE   D-DIMER, QUANTITATIVE      Component Value Range   D-Dimer, Quant <0.22  0.00 - 0.48 (ug/mL-FEU)  HEPATIC FUNCTION PANEL      Component Value Range   Total Protein 7.7  6.0 - 8.3 (g/dL)   Albumin 3.7  3.5 - 5.2 (g/dL)   AST 13  0 - 37 (U/L)   ALT 12  0 - 35 (U/L)   Alkaline Phosphatase 60  39 - 117 (U/L)   Total Bilirubin 0.3  0.3 - 1.2 (mg/dL)   Bilirubin, Direct 0.1  0.0 - 0.3 (mg/dL)   Indirect Bilirubin 0.2 (*) 0.3 - 0.9 (mg/dL)  LIPASE, BLOOD      Component Value Range   Lipase 44  11 - 59 (U/L)  PROTIME-INR      Component Value Range   Prothrombin Time 14.0  11.6 - 15.2 (seconds)   INR 1.06  0.00 - 1.49     Results for orders placed during the hospital encounter of 07/02/11  GLUCOSE, CAPILLARY      Component Value Range   Glucose-Capillary 116 (*) 70 - 99 (mg/dL)  BASIC METABOLIC PANEL      Component Value Range   Sodium 138  135 - 145 (mEq/L)   Potassium 3.7  3.5 - 5.1 (mEq/L)   Chloride 103  96 - 112 (mEq/L)   CO2 25  19 - 32 (mEq/L)   Glucose, Bld 94  70 - 99 (mg/dL)   BUN 10  6 - 23 (mg/dL)   Creatinine, Ser 4.54  0.50 - 1.10 (mg/dL)   Calcium 9.2  8.4 - 09.8 (mg/dL)   GFR calc non Af Amer >90  >90 (mL/min)   GFR calc Af Amer >90  >90 (mL/min)  CBC      Component Value Range   WBC 8.2  4.0 - 10.5 (K/uL)   RBC 4.30  3.87 - 5.11 (MIL/uL)    Hemoglobin 11.9 (*) 12.0 - 15.0 (g/dL)   HCT 11.9 (*) 14.7 - 46.0 (%)   MCV 80.0  78.0 - 100.0 (fL)   MCH 27.7  26.0 - 34.0 (pg)   MCHC 34.6  30.0 - 36.0 (g/dL)   RDW 82.9  56.2 - 13.0 (%)   Platelets 311  150 - 400 (K/uL)  DIFFERENTIAL      Component Value Range   Neutrophils Relative 70  43 - 77 (%)   Neutro Abs 5.8  1.7 - 7.7 (K/uL)   Lymphocytes Relative 20  12 - 46 (%)   Lymphs Abs 1.6  0.7 - 4.0 (K/uL)   Monocytes Relative 9  3 - 12 (%)   Monocytes Absolute 0.8  0.1 - 1.0 (K/uL)   Eosinophils Relative 1  0 - 5 (%)   Eosinophils Absolute 0.1  0.0 - 0.7 (K/uL)   Basophils Relative 0  0 -  1 (%)   Basophils Absolute 0.0  0.0 - 0.1 (K/uL)  URINALYSIS, ROUTINE W REFLEX MICROSCOPIC      Component Value Range   Color, Urine YELLOW  YELLOW    APPearance CLEAR  CLEAR    Specific Gravity, Urine 1.025  1.005 - 1.030    pH 6.0  5.0 - 8.0    Glucose, UA NEGATIVE  NEGATIVE (mg/dL)   Hgb urine dipstick NEGATIVE  NEGATIVE    Bilirubin Urine NEGATIVE  NEGATIVE    Ketones, ur NEGATIVE  NEGATIVE (mg/dL)   Protein, ur NEGATIVE  NEGATIVE (mg/dL)   Urobilinogen, UA 0.2  0.0 - 1.0 (mg/dL)   Nitrite NEGATIVE  NEGATIVE    Leukocytes, UA NEGATIVE  NEGATIVE   PREGNANCY, URINE      Component Value Range   Preg Test, Ur NEGATIVE  NEGATIVE   D-DIMER, QUANTITATIVE      Component Value Range   D-Dimer, Quant <0.22  0.00 - 0.48 (ug/mL-FEU)  HEPATIC FUNCTION PANEL      Component Value Range   Total Protein 7.7  6.0 - 8.3 (g/dL)   Albumin 3.7  3.5 - 5.2 (g/dL)   AST 13  0 - 37 (U/L)   ALT 12  0 - 35 (U/L)   Alkaline Phosphatase 60  39 - 117 (U/L)   Total Bilirubin 0.3  0.3 - 1.2 (mg/dL)   Bilirubin, Direct 0.1  0.0 - 0.3 (mg/dL)   Indirect Bilirubin 0.2 (*) 0.3 - 0.9 (mg/dL)  LIPASE, BLOOD      Component Value Range   Lipase 44  11 - 59 (U/L)  PROTIME-INR      Component Value Range   Prothrombin Time 14.0  11.6 - 15.2 (seconds)   INR 1.06  0.00 - 1.49    Results for orders placed during  the hospital encounter of 07/02/11  GLUCOSE, CAPILLARY      Component Value Range   Glucose-Capillary 116 (*) 70 - 99 (mg/dL)  BASIC METABOLIC PANEL      Component Value Range   Sodium 138  135 - 145 (mEq/L)   Potassium 3.7  3.5 - 5.1 (mEq/L)   Chloride 103  96 - 112 (mEq/L)   CO2 25  19 - 32 (mEq/L)   Glucose, Bld 94  70 - 99 (mg/dL)   BUN 10  6 - 23 (mg/dL)   Creatinine, Ser 9.60  0.50 - 1.10 (mg/dL)   Calcium 9.2  8.4 - 45.4 (mg/dL)   GFR calc non Af Amer >90  >90 (mL/min)   GFR calc Af Amer >90  >90 (mL/min)  CBC      Component Value Range   WBC 8.2  4.0 - 10.5 (K/uL)   RBC 4.30  3.87 - 5.11 (MIL/uL)   Hemoglobin 11.9 (*) 12.0 - 15.0 (g/dL)   HCT 09.8 (*) 11.9 - 46.0 (%)   MCV 80.0  78.0 - 100.0 (fL)   MCH 27.7  26.0 - 34.0 (pg)   MCHC 34.6  30.0 - 36.0 (g/dL)   RDW 14.7  82.9 - 56.2 (%)   Platelets 311  150 - 400 (K/uL)  DIFFERENTIAL      Component Value Range   Neutrophils Relative 70  43 - 77 (%)   Neutro Abs 5.8  1.7 - 7.7 (K/uL)   Lymphocytes Relative 20  12 - 46 (%)   Lymphs Abs 1.6  0.7 - 4.0 (K/uL)   Monocytes Relative 9  3 - 12 (%)   Monocytes  Absolute 0.8  0.1 - 1.0 (K/uL)   Eosinophils Relative 1  0 - 5 (%)   Eosinophils Absolute 0.1  0.0 - 0.7 (K/uL)   Basophils Relative 0  0 - 1 (%)   Basophils Absolute 0.0  0.0 - 0.1 (K/uL)  URINALYSIS, ROUTINE W REFLEX MICROSCOPIC      Component Value Range   Color, Urine YELLOW  YELLOW    APPearance CLEAR  CLEAR    Specific Gravity, Urine 1.025  1.005 - 1.030    pH 6.0  5.0 - 8.0    Glucose, UA NEGATIVE  NEGATIVE (mg/dL)   Hgb urine dipstick NEGATIVE  NEGATIVE    Bilirubin Urine NEGATIVE  NEGATIVE    Ketones, ur NEGATIVE  NEGATIVE (mg/dL)   Protein, ur NEGATIVE  NEGATIVE (mg/dL)   Urobilinogen, UA 0.2  0.0 - 1.0 (mg/dL)   Nitrite NEGATIVE  NEGATIVE    Leukocytes, UA NEGATIVE  NEGATIVE   PREGNANCY, URINE      Component Value Range   Preg Test, Ur NEGATIVE  NEGATIVE   D-DIMER, QUANTITATIVE      Component  Value Range   D-Dimer, Quant <0.22  0.00 - 0.48 (ug/mL-FEU)  HEPATIC FUNCTION PANEL      Component Value Range   Total Protein 7.7  6.0 - 8.3 (g/dL)   Albumin 3.7  3.5 - 5.2 (g/dL)   AST 13  0 - 37 (U/L)   ALT 12  0 - 35 (U/L)   Alkaline Phosphatase 60  39 - 117 (U/L)   Total Bilirubin 0.3  0.3 - 1.2 (mg/dL)   Bilirubin, Direct 0.1  0.0 - 0.3 (mg/dL)   Indirect Bilirubin 0.2 (*) 0.3 - 0.9 (mg/dL)  LIPASE, BLOOD      Component Value Range   Lipase 44  11 - 59 (U/L)  PROTIME-INR      Component Value Range   Prothrombin Time 14.0  11.6 - 15.2 (seconds)   INR 1.06  0.00 - 1.49    Dg Chest 2 View  07/02/2011  *RADIOLOGY REPORT*  Clinical Data: Weakness, near-syncope  CHEST - 2 VIEW  Comparison: None.  Findings: The lungs are clear.  Mediastinal contours appear normal. The heart is within upper limits of normal.  No bony abnormality is seen.  IMPRESSION: No active lung disease.  Original Report Authenticated By: Juline Patch, M.D.   Ct Head Wo Contrast  07/02/2011  *RADIOLOGY REPORT*  Clinical Data: Weakness and syncope.  History of hyperlipidemia, hypertension, and anxiety.  CT HEAD WITHOUT CONTRAST  Technique:  Contiguous axial images were obtained from the base of the skull through the vertex without contrast.  Comparison: None.  Findings: Bone windows demonstrate aerated petrous apices. Clear paranasal sinuses and mastoid air cells.  Soft tissue windows demonstrate no  mass lesion, hemorrhage, hydrocephalus, acute infarct, intra-axial, or extra-axial fluid collection.  IMPRESSION: Normal head CT.  Original Report Authenticated By: Consuello Bossier, M.D.      No diagnosis found.   Date: 07/02/2011  Rate: 85  Rhythm: normal sinus rhythm  QRS Axis: normal  Intervals: normal  ST/T Wave abnormalities: normal  Conduction Disutrbances:none  Narrative Interpretation:   Old EKG Reviewed: none available    MDM  Near-syncope episodes extensive workup without any significant findings  negative head CT for any intercranial abnormality. Chest x-ray without pneumonia or pneumothorax. EKG without any arrhythmias cardiac monitoring without arrhythmia. Labs without any specific findings negative for pregnancy no anemia no electrolyte abnormalities. D-dimer negative so  not likely consistent with [facility] embolism. Initial blood sugar was slightly low at 65 possible patient could be having some hypoglycemic episodes associated followup with her primary care provider.       I personally performed the services described in this documentation, which was scribed in my presence. The recorded information has been reviewed and considered.     [name] Jakes, MD 07/02/11 [phone]

## 2011-07-02 NOTE — ED Notes (Signed)
edp in with pt 

## 2011-07-02 NOTE — Telephone Encounter (Signed)
Spoke with [facility] in [place] and associated stated that pt was on glyburide 5mg  po bid.  Ordered med and sent to pharmacy with 4 refills.

## 2011-07-02 NOTE — Telephone Encounter (Signed)
Pt is asking for refill on glyburide 10mg .  Do you want her on this I do not see it on her med list.

## 2011-07-02 NOTE — ED Notes (Signed)
Pt transported to ct scan. No changes

## 2011-07-02 NOTE — Telephone Encounter (Signed)
Yes she had been taking before and I added janumet, pls ask her to have pharmacy send request and refill, or verify with her pharmacy last prescription filled , refill x 4

## 2011-07-02 NOTE — ED Notes (Signed)
Pt c/o generalized weakness started early this am. Has fell x 2, once in chair and one to floor due to weakness. Denies hitting head or LOC. Pt sugar was checked and was 65, was given snack and OJ since. Pt still weak and needed help to bed, could not stand. Pt is alert/oriented , denies pain. Nondiaphoretic.

## 2011-07-02 NOTE — Telephone Encounter (Signed)
Noted, and thanks!

## 2011-08-18 ENCOUNTER — Ambulatory Visit: Payer: Commercial Managed Care - PPO | Admitting: Family Medicine

## 2012-09-04 ENCOUNTER — Other Ambulatory Visit: Payer: Self-pay | Admitting: Advanced Practice Midwife

## 2012-09-04 ENCOUNTER — Other Ambulatory Visit: Payer: Self-pay | Admitting: Family Medicine

## 2012-09-09 ENCOUNTER — Other Ambulatory Visit: Payer: Self-pay | Admitting: Family Medicine

## 2012-09-12 ENCOUNTER — Other Ambulatory Visit: Payer: Self-pay | Admitting: Obstetrics & Gynecology

## 2012-09-12 MED ORDER — GLYBURIDE 5 MG PO TABS: ORAL_TABLET | ORAL | Status: DC

## 2012-12-13 ENCOUNTER — Ambulatory Visit: Payer: 59 | Admitting: Family Medicine

## 2012-12-13 ENCOUNTER — Encounter: Payer: Self-pay | Admitting: Family Medicine

## 2013-01-24 ENCOUNTER — Encounter: Payer: Self-pay | Admitting: Family Medicine

## 2013-01-24 ENCOUNTER — Ambulatory Visit (INDEPENDENT_AMBULATORY_CARE_PROVIDER_SITE_OTHER): Payer: 59 | Admitting: Family Medicine

## 2013-01-24 VITALS — BP 150/90 | HR 86 | Resp 18 | Ht 60.0 in | Wt 127.0 lb

## 2013-01-24 DIAGNOSIS — N760 Acute vaginitis: Secondary | ICD-10-CM

## 2013-01-24 DIAGNOSIS — Z9119 Patient's noncompliance with other medical treatment and regimen: Secondary | ICD-10-CM

## 2013-01-24 DIAGNOSIS — M545 Low back pain, unspecified: Secondary | ICD-10-CM

## 2013-01-24 DIAGNOSIS — R5381 Other malaise: Secondary | ICD-10-CM

## 2013-01-24 DIAGNOSIS — I10 Essential (primary) hypertension: Secondary | ICD-10-CM

## 2013-01-24 DIAGNOSIS — E559 Vitamin D deficiency, unspecified: Secondary | ICD-10-CM

## 2013-01-24 DIAGNOSIS — E119 Type 2 diabetes mellitus without complications: Secondary | ICD-10-CM

## 2013-01-24 DIAGNOSIS — IMO0001 Reserved for inherently not codable concepts without codable children: Secondary | ICD-10-CM

## 2013-01-24 DIAGNOSIS — Z91199 Patient's noncompliance with other medical treatment and regimen due to unspecified reason: Secondary | ICD-10-CM

## 2013-01-24 DIAGNOSIS — F411 Generalized anxiety disorder: Secondary | ICD-10-CM

## 2013-01-24 DIAGNOSIS — E785 Hyperlipidemia, unspecified: Secondary | ICD-10-CM

## 2013-01-24 MED ORDER — FLUCONAZOLE 150 MG PO TABS: ORAL_TABLET | ORAL | Status: AC

## 2013-01-24 MED ORDER — INSULIN ASPART 100 UNIT/ML ~~LOC~~ SOLN: 5.0000 [IU] | Freq: Once | SUBCUTANEOUS | Status: DC

## 2013-01-24 MED ORDER — SITAGLIPTIN PHOS-METFORMIN HCL 50-1000 MG PO TABS: 1.0000 | ORAL_TABLET | Freq: Two times a day (BID) | ORAL | Status: DC

## 2013-01-24 MED ORDER — LISINOPRIL 20 MG PO TABS: 20.0000 mg | ORAL_TABLET | Freq: Every day | ORAL | Status: DC

## 2013-01-24 NOTE — Progress Notes (Signed)
  Subjective:    Patient ID: [name] Fernandez, female    DOB: 1975/06/03, 37 y.o.   MRN: [id number]  HPI Pt in tpo re establish care. She has been on no medications, has not been testing her blood suagr and is experiencing fatigue, dry mouth and polyuria. 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 Denies chest congestion, productive cough or wheezing. Denies chest pains, palpitations and leg swelling Denies abdominal pain, nausea, vomiting,diarrhea or constipation.   Denies dysuria, frequency, hesitancy or incontinence. . Denies headaches, seizures, numbness, or tingling.  Denies skin break down or rash.        Objective:   Physical Exam  Patient alert and oriented and in no cardiopulmonary distress.  HEENT: No facial asymmetry, EOMI, no sinus tenderness,  oropharynx pink and moist.  Neck supple no adenopathy.  Chest: Clear to auscultation bilaterally.  CVS: S1, S2 no murmurs, no S3.  ABD: Soft non tender. Bowel sounds normal.  Ext: No edema  MS: Adequate ROM spine, shoulders, hips and knees.  Skin: Intact, no ulcerations or rash noted.  Psych: Good eye contact, normal affect. Memory intact not anxious or depressed appearing.  CNS: CN 2-12 intact, power, tone and sensation normal throughout.       Assessment & Plan:

## 2013-01-24 NOTE — Patient Instructions (Addendum)
cPE in 5 weeks, call if you need me before  Urine for microalb from office today.  5 units regular insulin in the office today   Fasting lipid, cmp and EGFR, HBA1C, TSH, CBC and vit D in the morning  New for blood pressure is lisinopril  New for diabetes is janumet  Medication for cholesterol and vitamin d will be prescribed, if needed, after labs  New for vaginal d/c and itch is fluconazole  Xray of the back is ordered pls have this done by tomorrow

## 2013-01-26 LAB — MICROALBUMIN / CREATININE URINE RATIO
Creatinine, Urine: 52 mg/dL
Microalb Creat Ratio: 48.7 mg/g — ABNORMAL HIGH (ref 0.0–30.0)
Microalb, Ur: 2.53 mg/dL — ABNORMAL HIGH (ref 0.00–1.89)

## 2013-01-28 DIAGNOSIS — Z9119 Patient's noncompliance with other medical treatment and regimen: Secondary | ICD-10-CM | POA: Insufficient documentation

## 2013-01-28 NOTE — Assessment & Plan Note (Signed)
Hyperlipidemia:Low fat diet discussed and encouraged.  Updated lab needed 

## 2013-01-28 NOTE — Assessment & Plan Note (Addendum)
Pt non compliant wit medicstion and does not keep f/u appointments or have labs in a timely fashion  Re educated re the absolute need to make changes in her approach to her Dm to prevent further organ damage  Insulin administered at OV

## 2013-01-28 NOTE — Assessment & Plan Note (Addendum)
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

## 2013-01-28 NOTE — Assessment & Plan Note (Signed)
Approximately 7 minutes spent explaining to pt thje need to take her chronic diseases seriously, and to comply with medical tests, dietary recommendations and f/u labs in a timely fashion. He hope is that she will make the needed changes before it is too late

## 2013-01-28 NOTE — Assessment & Plan Note (Signed)
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

## 2013-01-28 NOTE — Assessment & Plan Note (Signed)
Uncontrolled due to med non compliance, pt to resume same DASH diet and commitment to daily physical activity for a minimum of 30 minutes discussed and encouraged, as a part of hypertension management. The importance of attaining a healthy weight is also discussed.

## 2013-01-28 NOTE — Assessment & Plan Note (Signed)
Dg lumbar spine to further assess

## 2013-01-31 LAB — COMPLETE METABOLIC PANEL WITH GFR
Alkaline Phosphatase: 66 U/L (ref 39–117)
BUN: 9 mg/dL (ref 6–23)
CO2: 26 mEq/L (ref 19–32)
Creat: 0.51 mg/dL (ref 0.50–1.10)
GFR, Est African American: >89 mL/min
GFR, Est Non African American: >89 mL/min
Glucose, Bld: 194 mg/dL — ABNORMAL HIGH (ref 70–99)
Total Bilirubin: 0.5 mg/dL (ref 0.3–1.2)

## 2013-01-31 LAB — CBC
Hemoglobin: 12 g/dL (ref 12.0–15.0)
MCH: 26.5 pg (ref 26.0–34.0)
Platelets: 382 10*3/uL (ref 150–400)
RBC: 4.52 MIL/uL (ref 3.87–5.11)
WBC: 6.1 10*3/uL (ref 4.0–10.5)

## 2013-01-31 LAB — TSH: TSH: 0.991 u[IU]/mL (ref 0.350–4.500)

## 2013-01-31 LAB — LIPID PANEL
LDL Cholesterol: 167 mg/dL — ABNORMAL HIGH (ref 0–99)
Triglycerides: 103 mg/dL (ref <150)
VLDL: 21 mg/dL (ref 0–40)

## 2013-01-31 LAB — HEMOGLOBIN A1C: Hgb A1c MFr Bld: 10.3 % — ABNORMAL HIGH (ref <5.7)

## 2013-02-01 ENCOUNTER — Ambulatory Visit (HOSPITAL_COMMUNITY)
Admission: RE | Admit: 2013-02-01 | Discharge: 2013-02-01 | Disposition: A | Discharge status: Home / Self Care | Payer: 59 | Source: Ambulatory Visit | Attending: Family Medicine | Admitting: Family Medicine

## 2013-02-01 ENCOUNTER — Telehealth: Payer: Self-pay | Admitting: Family Medicine

## 2013-02-01 DIAGNOSIS — M545 Low back pain, unspecified: Secondary | ICD-10-CM | POA: Insufficient documentation

## 2013-02-01 LAB — VITAMIN D 25 HYDROXY (VIT D DEFICIENCY, FRACTURES): Vit D, 25-Hydroxy: 65 ng/mL (ref 30–89)

## 2013-02-01 NOTE — Telephone Encounter (Signed)
pls provide her with a card, should be less than that, also I will send you info on her labs to dioscuss when you spk with her

## 2013-02-02 ENCOUNTER — Other Ambulatory Visit: Payer: Self-pay | Admitting: Family Medicine

## 2013-02-02 ENCOUNTER — Other Ambulatory Visit: Payer: Self-pay

## 2013-02-02 MED ORDER — PRAVASTATIN SODIUM 40 MG PO TABS: 40.0000 mg | ORAL_TABLET | Freq: Every evening | ORAL | Status: DC

## 2013-02-02 MED ORDER — CANAGLIFLOZIN 100 MG PO TABS: 1.0000 | ORAL_TABLET | Freq: Every day | ORAL | Status: DC

## 2013-02-02 MED ORDER — CANAGLIFLOZIN 300 MG PO TABS: 1.0000 | ORAL_TABLET | Freq: Every day | ORAL | Status: DC

## 2013-02-02 NOTE — Telephone Encounter (Signed)
Patient aware and coming to collect

## 2013-02-02 NOTE — Telephone Encounter (Signed)
Called patient, no option to leave message

## 2013-02-12 ENCOUNTER — Other Ambulatory Visit: Payer: Self-pay | Admitting: Family Medicine

## 2013-02-12 ENCOUNTER — Telehealth: Payer: Self-pay | Admitting: Family Medicine

## 2013-02-12 MED ORDER — GLIPIZIDE ER 10 MG PO TB24: 10.0000 mg | ORAL_TABLET | Freq: Every day | ORAL | Status: DC

## 2013-02-12 NOTE — Telephone Encounter (Signed)
Please advise 

## 2013-02-12 NOTE — Telephone Encounter (Signed)
[  pls ask if she is  Taking invokana 300mg  one daily, document what she states are her fasting blood sugars. I have sent in glipizide 10mg  one daily Pls send me the response

## 2013-02-16 NOTE — Telephone Encounter (Signed)
Called and left message for patient to return call.  

## 2013-02-21 NOTE — Telephone Encounter (Signed)
Called and left second message for patient  Will send letter.

## 2013-06-08 ENCOUNTER — Telehealth: Payer: Self-pay | Admitting: Family Medicine

## 2013-06-08 DIAGNOSIS — IMO0002 Reserved for concepts with insufficient information to code with codable children: Secondary | ICD-10-CM

## 2013-06-08 DIAGNOSIS — E785 Hyperlipidemia, unspecified: Secondary | ICD-10-CM

## 2013-06-08 DIAGNOSIS — E1165 Type 2 diabetes mellitus with hyperglycemia: Secondary | ICD-10-CM

## 2013-06-08 NOTE — Telephone Encounter (Signed)
Pls call pt, and let her know I am happy that she had her eye exam. Pls ask that she rept her HBa1C and cmp and eGFR, lipid fasting pls order , and fax to lab , i would like to review as soon as possible since she was out of control. 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

## 2013-06-14 NOTE — Addendum Note (Signed)
Addended by: Anthoney HaradaSLADE, COURTNEY B on: 06/14/2013 08:46 AM   Modules accepted: Orders

## 2013-06-14 NOTE — Telephone Encounter (Signed)
Unable to reach patient by phone.   Letter sent with attached lab requisition.

## 2013-12-04 ENCOUNTER — Ambulatory Visit (INDEPENDENT_AMBULATORY_CARE_PROVIDER_SITE_OTHER): Payer: 59 | Admitting: Family Medicine

## 2013-12-04 ENCOUNTER — Encounter: Payer: Self-pay | Admitting: Family Medicine

## 2013-12-04 ENCOUNTER — Encounter (INDEPENDENT_AMBULATORY_CARE_PROVIDER_SITE_OTHER): Payer: Self-pay

## 2013-12-04 VITALS — BP 158/90 | HR 98 | Resp 16 | Ht 60.0 in | Wt 132.1 lb

## 2013-12-04 DIAGNOSIS — R1013 Epigastric pain: Secondary | ICD-10-CM

## 2013-12-04 DIAGNOSIS — I10 Essential (primary) hypertension: Secondary | ICD-10-CM

## 2013-12-04 DIAGNOSIS — R894 Abnormal immunological findings in specimens from other organs, systems and tissues: Secondary | ICD-10-CM

## 2013-12-04 DIAGNOSIS — K3189 Other diseases of stomach and duodenum: Secondary | ICD-10-CM

## 2013-12-04 DIAGNOSIS — E785 Hyperlipidemia, unspecified: Secondary | ICD-10-CM

## 2013-12-04 DIAGNOSIS — R1011 Right upper quadrant pain: Secondary | ICD-10-CM

## 2013-12-04 DIAGNOSIS — IMO0001 Reserved for inherently not codable concepts without codable children: Secondary | ICD-10-CM

## 2013-12-04 DIAGNOSIS — IMO0002 Reserved for concepts with insufficient information to code with codable children: Secondary | ICD-10-CM

## 2013-12-04 DIAGNOSIS — E1165 Type 2 diabetes mellitus with hyperglycemia: Secondary | ICD-10-CM

## 2013-12-04 DIAGNOSIS — R7689 Other specified abnormal immunological findings in serum: Secondary | ICD-10-CM | POA: Insufficient documentation

## 2013-12-04 DIAGNOSIS — R768 Other specified abnormal immunological findings in serum: Secondary | ICD-10-CM

## 2013-12-04 DIAGNOSIS — F411 Generalized anxiety disorder: Secondary | ICD-10-CM

## 2013-12-04 MED ORDER — AMOXICILL-CLARITHRO-LANSOPRAZ PO MISC: Freq: Two times a day (BID) | ORAL | Status: DC

## 2013-12-04 MED ORDER — LISINOPRIL 20 MG PO TABS: 20.0000 mg | ORAL_TABLET | Freq: Every day | ORAL | Status: DC

## 2013-12-04 MED ORDER — BUSPIRONE HCL 5 MG PO TABS: 5.0000 mg | ORAL_TABLET | Freq: Two times a day (BID) | ORAL | Status: DC

## 2013-12-04 NOTE — Patient Instructions (Addendum)
F/u end August, call if you need me before  BP is high, medication is sent to [facility] start today  New for anxiety is buspar 5mg  one twice daily we will give you the script  Prev pack script is given to you   We will call with lab results  US of abdomen is ordered to r/o gallstones, pls take test  You will get # for employee health, de stressing /counselling session will help you alot

## 2013-12-04 NOTE — Progress Notes (Signed)
Subjective:    Patient ID: [name] Fernandez, female    DOB: May 03, 1976, 38 y.o.   MRN: [id number]  HPI The PT is here for follow up and re-evaluation of chronic medical conditions, medication management and review of any available recent lab and radiology data. She has not been in for nearly 8 months, despite the fact that she know that she does have uncontrolled DM, hyperlipidemia and HTN. States she works all the time , 2 jobs, and then focuses on caring for family members who "all depend on her" Several close family members including her son have been recently diagnosed with chronic illness so this is taking a toll on her health C//o always feeling tired and overwhelmed, increasingly unable to function as she would like to and as if her life is totally pout of control. Not suicidal or homicidal   Preventive health is updated, specifically  Cancer screening and Immunization.    Despite knowing that she has significant diseases which are controlled, she takes no medication regulalrly  Review of Systems See HPI Denies recent fever or chills. Denies sinus pressure, nasal congestion, ear pain or sore throat. Denies chest congestion, productive cough or wheezing. Denies chest pains, palpitations and leg swelling Denies dysuria, frequency, hesitancy or incontinence. Denies joint pain, swelling and limitation in mobility. Denies headaches, seizures, numbness, or tingling.  Denies skin break down or rash.        Objective:   Physical Exam  BP 158/90  Pulse 98  Resp 16  Ht 5' (1.524 m)  Wt 132 lb 1.9 oz (59.929 kg)  BMI 25.80 kg/m2  SpO2 98% Patient alert and oriented and in no cardiopulmonary distress.Excessive anxiety and pressure of speech  HEENT: No facial asymmetry, EOMI,   oropharynx pink and moist.  Neck supple no JVD, no mass.  Chest: Clear to auscultation bilaterally.  CVS: S1, S2 no murmurs, no S3.Regular rate.  ABD: Soft eoigastric and RUQ tenderness, no guarding  or rebound, normal BS   Ext: No edema  MS: Adequate ROM spine, shoulders, hips and knees.  Skin: Intact, no ulcerations or rash noted.  Psych: Good eye contact,. Memory intact  anxious teraful and depressed appearing.  CNS: CN 2-12 intact, power,  normal throughout.no focal deficits noted.       Assessment & Plan:  Helicobacter pylori antibody positive Heartburn, belching bloating reflux, no dysphagia, did not comply with H pylori treatment, will now start treatment as symptoms are out of control  HYPERTENSION Uncontrolled, non compliant with medication same is prescribed and importance of talking daily as prescribed is stressed DASH diet and commitment to daily physical activity for a minimum of 30 minutes discussed and encouraged, as a part of hypertension management. The importance of attaining a healthy weight is also discussed.   ANXIETY Uncontrolled, sever and disabling , pt now recognizes the need for , and is ready to get help with medicaton for her symptoms so that she is able to function. Recent multiple losses and illnesses in her family have triggered her decompensation  DM (diabetes mellitus), type 2, uncontrolled Uncontrolled due to medical non compliance, neither takes medications, tests blood sugars or keps follow up a[ppointments Patient advised to reduce carb and sweets, commit to regular physical activity, take meds as prescribed, test blood as directed, and attempt to lose weight, to improve blood sugar control.   Dyspepsia [place] ordered years ago to eval for cholelithiasis, did not follow through intends to have current study ordered due to  symptom severity  Hyperlipidemia LDL goal <100 Uncontrolled .Hyperlipidemia:Low fat diet discussed and encouraged. Start statin therapy

## 2013-12-04 NOTE — Assessment & Plan Note (Addendum)
Heartburn, belching bloating reflux, no dysphagia, did not comply with H pylori treatment, will now start treatment as symptoms are out of control

## 2013-12-05 LAB — LIPID PANEL
Cholesterol: 245 mg/dL — ABNORMAL HIGH (ref 0–200)
HDL: 34 mg/dL — AB (ref >39)
LDL CALC: 181 mg/dL — AB (ref 0–99)
TRIGLYCERIDES: 151 mg/dL — AB (ref <150)
Total CHOL/HDL Ratio: 7.2 Ratio
VLDL: 30 mg/dL (ref 0–40)

## 2013-12-05 LAB — COMPLETE METABOLIC PANEL WITH GFR
ALT: 14 U/L (ref 0–35)
AST: 14 U/L (ref 0–37)
Albumin: 3.8 g/dL (ref 3.5–5.2)
Alkaline Phosphatase: 53 U/L (ref 39–117)
BILIRUBIN TOTAL: 0.5 mg/dL (ref 0.2–1.2)
BUN: 8 mg/dL (ref 6–23)
CO2: 26 mEq/L (ref 19–32)
Calcium: 8.7 mg/dL (ref 8.4–10.5)
Chloride: 101 mEq/L (ref 96–112)
Creat: 0.52 mg/dL (ref 0.50–1.10)
GFR, Est African American: >89 mL/min
GFR, Est Non African American: >89 mL/min
Glucose, Bld: 172 mg/dL — ABNORMAL HIGH (ref 70–99)
Potassium: 4.1 mEq/L (ref 3.5–5.3)
Sodium: 138 mEq/L (ref 135–145)
Total Protein: 7.1 g/dL (ref 6.0–8.3)

## 2013-12-05 LAB — HEMOGLOBIN A1C
HEMOGLOBIN A1C: 9.7 % — AB (ref <5.7)
MEAN PLASMA GLUCOSE: 232 mg/dL — AB (ref <117)

## 2013-12-06 ENCOUNTER — Other Ambulatory Visit: Payer: Self-pay

## 2013-12-06 MED ORDER — GLIPIZIDE ER 10 MG PO TB24: 10.0000 mg | ORAL_TABLET | Freq: Every day | ORAL | Status: DC

## 2013-12-11 ENCOUNTER — Other Ambulatory Visit: Payer: Self-pay

## 2013-12-12 ENCOUNTER — Telehealth: Payer: Self-pay

## 2013-12-12 ENCOUNTER — Ambulatory Visit (HOSPITAL_COMMUNITY)
Admission: RE | Admit: 2013-12-12 | Discharge: 2013-12-12 | Disposition: A | Discharge status: Home / Self Care | Payer: 59 | Source: Ambulatory Visit | Attending: Family Medicine | Admitting: Family Medicine

## 2013-12-12 ENCOUNTER — Other Ambulatory Visit: Payer: Self-pay | Admitting: Family Medicine

## 2013-12-12 DIAGNOSIS — R109 Unspecified abdominal pain: Secondary | ICD-10-CM | POA: Insufficient documentation

## 2013-12-12 DIAGNOSIS — Z711 Person with feared health complaint in whom no diagnosis is made: Secondary | ICD-10-CM

## 2013-12-12 DIAGNOSIS — K8020 Calculus of gallbladder without cholecystitis without obstruction: Secondary | ICD-10-CM

## 2013-12-12 DIAGNOSIS — R768 Other specified abnormal immunological findings in serum: Secondary | ICD-10-CM

## 2013-12-12 DIAGNOSIS — R160 Hepatomegaly, not elsewhere classified: Secondary | ICD-10-CM | POA: Insufficient documentation

## 2013-12-12 NOTE — Telephone Encounter (Signed)
Message copied by Anthoney HaradaSLADE, COURTNEY B on Wed Dec 12, 2013  3:53 PM ------      Message from: Syliva OvermanSIMPSON, MARGARET E      Created: Wed Dec 12, 2013 11:05 AM       pls let pt know she has MULTIPLE gallstones, which will be a cause of her pain, I recommend removal of the gallbladder.      Pls refer to surgeon of her choice ------

## 2013-12-19 ENCOUNTER — Telehealth: Payer: Self-pay

## 2013-12-19 ENCOUNTER — Encounter: Payer: Self-pay | Admitting: Family Medicine

## 2013-12-19 NOTE — Telephone Encounter (Signed)
Patient collected  

## 2013-12-19 NOTE — Consult Note (Signed)
NAMMontez Hageman:  [place], Larsen              ACCOUNT NO.:  [id number]  MEDICAL RECORD NO.:  [id number]  LOCATION:                                 FACILITY:  PHYSICIAN:  Barbaraann BarthelWilliam Mabelle Mungin, M.D. DATE OF BIRTH:  1975/07/04  DATE OF CONSULTATION:  12/18/2013 DATE OF DISCHARGE:                                CONSULTATION   NOTE:  Surgery was asked to see this 38 year old African American for gallstones.  In essence, she has had a 3-year history of what sounds to be recurrent biliary colic and from time to time with right upper quadrant pain, nausea and vomiting, this pain radiates towards her back. This has been going on for at least 3 years with occasional flare-ups. Last flare-up was last week or so.  PAST MEDICAL HISTORY:  The patient is a type 2 diabetic, has a history of hypertension, hyperlipidemia, has a past history of GERD and a past history of H. pylori.  PAST SURGERIES:  Include right partial mastectomy for benign disease.  MEDICATIONS:  See medication list.  ALLERGIES:  SHE IS ALLERGIC TO A DIABETIC MEDICINE, JANUMET, THAT CAUSE VOMITING.  SOCIAL HISTORY:  She is a nonsmoker and nondrinker.  PHYSICAL EXAMINATION:  VITAL SIGNS:  She is 5 feet tall, weighs 125 pounds, temperature is 98.0, pulse is 80, respirations 14, blood pressure 120/70. HEENT:  Head is normocephalic.  Eyes, extraocular movements are intact. Pupils are round and reactive to light and accommodation.  There is no conjunctive pallor or scleral injection.  Sclera has a normal tincture. Nose and mucosa are moist.  The patient has some carious dental pieces on the lower molars.  No bruits, no adenopathy, no thyromegaly are appreciated. CHEST:  Clear, both anterior and posterior auscultation. HEART:  Regular rhythm. BREAST:  This was a cursory exam.  The patient has a past history of right partial mastectomy for benign disease. ABDOMEN:  There is no guarding or rebound.  The patient is not very tender in the  right upper quadrant at present.  No CVA tenderness, no hernias, no rebound. RECTAL:  Deferred as this patient has had some spotting menses.  She has a birth control device placed. EXTREMITIES:  Grossly within normal limits.  REVIEW OF SYSTEMS:  NEURO:  History of migraines, seizures.  She has some history of anxiety.  ENDOCRINE:  No history of thyroid disease, but has a history of type 2 diabetes.  No history of adrenal problems. CARDIOPULMONARY:  History of hypertension and hyperlipidemia.  She is a nonsmoker.  MUSCULOSKELETAL:  Grossly within normal limits.  OB/GYN: She is a gravida 4, para 3, abortus 1, cesarean 0 female stated with an anticonceptive device placed.  GI:  History of GERD and Helicobacter pylori, for which, she was treated.  No past history of hepatitis.  No past history of constipation.  She has some loose stools occasionally, no history of bright red rectal bleeding, melena or black tarry stools. No history of unexplained weight loss.  No history of irritable bowel syndrome.  She has never had a colonoscopy.  GU:  No history of frequency, dysuria, or kidney stones.  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  Sonogram has revealed multiple stones.  We will check some preoperative liver function studies and plan for surgery via the outpatient department.  I have placed her on a restrictive diet and as she is not feeling well, I have excused her from work.     Barbaraann Barthel, M.D.     WB/MEDQ  D:  12/18/2013  T:  12/19/2013  Job:  [id number]  cc:   Milus Mallick. Lodema Hong, M.D. Fax: [phone]

## 2013-12-24 ENCOUNTER — Encounter (HOSPITAL_COMMUNITY): Payer: Self-pay

## 2013-12-24 ENCOUNTER — Encounter (HOSPITAL_COMMUNITY)
Admission: RE | Admit: 2013-12-24 | Discharge: 2013-12-24 | Disposition: A | Discharge status: Home / Self Care | Payer: 59 | Source: Ambulatory Visit | Attending: General Surgery | Admitting: General Surgery

## 2013-12-24 DIAGNOSIS — F411 Generalized anxiety disorder: Secondary | ICD-10-CM | POA: Diagnosis not present

## 2013-12-24 DIAGNOSIS — I10 Essential (primary) hypertension: Secondary | ICD-10-CM | POA: Diagnosis not present

## 2013-12-24 DIAGNOSIS — K219 Gastro-esophageal reflux disease without esophagitis: Secondary | ICD-10-CM | POA: Diagnosis not present

## 2013-12-24 DIAGNOSIS — F3289 Other specified depressive episodes: Secondary | ICD-10-CM | POA: Diagnosis not present

## 2013-12-24 DIAGNOSIS — F329 Major depressive disorder, single episode, unspecified: Secondary | ICD-10-CM | POA: Diagnosis not present

## 2013-12-24 DIAGNOSIS — R1011 Right upper quadrant pain: Secondary | ICD-10-CM | POA: Diagnosis present

## 2013-12-24 DIAGNOSIS — K8010 Calculus of gallbladder with chronic cholecystitis without obstruction: Secondary | ICD-10-CM | POA: Diagnosis not present

## 2013-12-24 DIAGNOSIS — E119 Type 2 diabetes mellitus without complications: Secondary | ICD-10-CM | POA: Diagnosis not present

## 2013-12-24 HISTORY — DX: Gastro-esophageal reflux disease without esophagitis: K21.9

## 2013-12-24 HISTORY — DX: Unspecified osteoarthritis, unspecified site: M19.90

## 2013-12-24 HISTORY — DX: Anxiety disorder, unspecified: F41.9

## 2013-12-24 HISTORY — DX: Depression, unspecified: F32.A

## 2013-12-24 HISTORY — DX: Major depressive disorder, single episode, unspecified: F32.9

## 2013-12-24 HISTORY — DX: Headache: R51

## 2013-12-24 LAB — HEPATIC FUNCTION PANEL
ALBUMIN: 3.8 g/dL (ref 3.5–5.2)
ALT: 17 U/L (ref 0–35)
AST: 18 U/L (ref 0–37)
Alkaline Phosphatase: 71 U/L (ref 39–117)
Bilirubin, Direct: <0.2 mg/dL (ref 0.0–0.3)
TOTAL PROTEIN: 7.9 g/dL (ref 6.0–8.3)
Total Bilirubin: 0.2 mg/dL — ABNORMAL LOW (ref 0.3–1.2)

## 2013-12-24 LAB — DIFFERENTIAL
BASOS ABS: 0 10*3/uL (ref 0.0–0.1)
BASOS PCT: 1 % (ref 0–1)
Eosinophils Absolute: 0.1 10*3/uL (ref 0.0–0.7)
Eosinophils Relative: 1 % (ref 0–5)
Lymphocytes Relative: 40 % (ref 12–46)
Lymphs Abs: 2.7 10*3/uL (ref 0.7–4.0)
MONOS PCT: 8 % (ref 3–12)
Monocytes Absolute: 0.5 10*3/uL (ref 0.1–1.0)
Neutro Abs: 3.5 10*3/uL (ref 1.7–7.7)
Neutrophils Relative %: 50 % (ref 43–77)

## 2013-12-24 LAB — CBC
HCT: 38.5 % (ref 36.0–46.0)
HEMOGLOBIN: 13.3 g/dL (ref 12.0–15.0)
MCH: 27.5 pg (ref 26.0–34.0)
MCHC: 34.5 g/dL (ref 30.0–36.0)
MCV: 79.7 fL (ref 78.0–100.0)
Platelets: 419 10*3/uL — ABNORMAL HIGH (ref 150–400)
RBC: 4.83 MIL/uL (ref 3.87–5.11)
RDW: 13.4 % (ref 11.5–15.5)
WBC: 7 10*3/uL (ref 4.0–10.5)

## 2013-12-24 LAB — BASIC METABOLIC PANEL
ANION GAP: 14 (ref 5–15)
BUN: 6 mg/dL (ref 6–23)
CHLORIDE: 103 meq/L (ref 96–112)
CO2: 24 mEq/L (ref 19–32)
Calcium: 9.5 mg/dL (ref 8.4–10.5)
Creatinine, Ser: 0.56 mg/dL (ref 0.50–1.10)
GFR calc non Af Amer: >90 mL/min (ref >90)
Glucose, Bld: 108 mg/dL — ABNORMAL HIGH (ref 70–99)
POTASSIUM: 4.7 meq/L (ref 3.7–5.3)
Sodium: 141 mEq/L (ref 137–147)

## 2013-12-24 LAB — HCG, SERUM, QUALITATIVE: PREG SERUM: NEGATIVE

## 2013-12-24 LAB — AMYLASE: Amylase: 117 U/L — ABNORMAL HIGH (ref 0–105)

## 2013-12-24 NOTE — Patient Instructions (Signed)
[name] Fernandez  12/24/2013   Your procedure is scheduled on:  12/27/13  Report to [facility] at 0700 AM.  Call this number if you have problems the morning of surgery: [phone]   Remember:   Do not eat food or drink liquids after midnight.   Take these medicines the morning of surgery with A SIP OF WATER: buspar, lisinopril   Do not wear jewelry, make-up or nail polish.  Do not wear lotions, powders, or perfumes. You may wear deodorant.  Do not shave 48 hours prior to surgery. Men may shave face and neck.  Do not bring valuables to the hospital.  [facility] is not responsible                  for any belongings or valuables.               Contacts, dentures or bridgework may not be worn into surgery.  Leave suitcase in the car. After surgery it may be brought to your room.  For patients admitted to the hospital, discharge time is determined by your                treatment team.               Patients discharged the day of surgery will not be allowed to drive  home.  Name and phone number of your driver: family  Special Instructions: Shower using CHG 2 nights before surgery and the night before surgery.  If you shower the day of surgery use CHG.  Use special wash - you have one bottle of CHG for all showers.  You should use approximately 1/3 of the bottle for each shower.   Please read over the following fact sheets that you were given: Anesthesia Post-op Instructions and Care and Recovery After Surgery   PATIENT INSTRUCTIONS POST-ANESTHESIA  IMMEDIATELY FOLLOWING SURGERY:  Do not drive or operate machinery for the first twenty four hours after surgery.  Do not make any important decisions for twenty four hours after surgery or while taking narcotic pain medications or sedatives.  If you develop intractable nausea and vomiting or a severe headache please notify your doctor immediately.  FOLLOW-UP:  Please make an appointment with your surgeon as instructed. You do not need to  follow up with anesthesia unless specifically instructed to do so.  WOUND CARE INSTRUCTIONS (if applicable):  Keep a dry clean dressing on the anesthesia/puncture wound site if there is drainage.  Once the wound has quit draining you may leave it open to air.  Generally you should leave the bandage intact for twenty four hours unless there is drainage.  If the epidural site drains for more than 36-48 hours please call the anesthesia department.  QUESTIONS?:  Please feel free to call your physician or the hospital operator if you have any questions, and they will be happy to assist you.      Laparoscopic Cholecystectomy Laparoscopic cholecystectomy is surgery to remove the gallbladder. The gallbladder is located in the upper right part of the abdomen, behind the liver. It is a storage sac for bile produced in the liver. Bile aids in the digestion and absorption of fats. Cholecystectomy is often done for inflammation of the gallbladder (cholecystitis). This condition is usually caused by a buildup of gallstones (cholelithiasis) in your gallbladder. Gallstones can block the flow of bile, resulting in inflammation and pain. In severe cases, emergency surgery may be required. When emergency surgery is not  required, you will have time to prepare for the procedure. Laparoscopic surgery is an alternative to open surgery. Laparoscopic surgery has a shorter recovery time. Your common bile duct may also need to be examined during the procedure. If stones are found in the common bile duct, they may be removed. LET [facility] PROVIDER KNOW ABOUT:  Any allergies you have.  All medicines you are taking, including vitamins, herbs, eye drops, creams, and over-the-counter medicines.  Previous problems you or members of your family have had with the use of anesthetics.  Any blood disorders you have.  Previous surgeries you have had.  Medical conditions you have. RISKS AND COMPLICATIONS Generally, this is  a safe procedure. However, as with any procedure, complications can occur. Possible complications include:  Infection.  Damage to the common bile duct, nerves, arteries, veins, or other internal organs such as the stomach, liver, or intestines.  Bleeding.  A stone may remain in the common bile duct.  A bile leak from the cyst duct that is clipped when your gallbladder is removed.  The need to convert to open surgery, which requires a larger incision in the abdomen. This may be necessary if your surgeon thinks it is not safe to continue with a laparoscopic procedure. BEFORE THE PROCEDURE  Ask your health care provider about changing or stopping any regular medicines. You will need to stop taking aspirin or blood thinners at least 5 days prior to surgery.  Do not eat or drink anything after midnight the night before surgery.  Let your health care provider know if you develop a cold or other infectious problem before surgery. PROCEDURE   You will be given medicine to make you sleep through the procedure (general anesthetic). A breathing tube will be placed in your mouth.  When you are asleep, your surgeon will make several small cuts (incisions) in your abdomen.  A thin, lighted tube with a tiny camera on the end (laparoscope) is inserted through one of the small incisions. The camera on the laparoscope sends a picture to a TV screen in the operating room. This gives the surgeon a good view inside your abdomen.  A gas will be pumped into your abdomen. This expands your abdomen so that the surgeon has more room to perform the surgery.  Other tools needed for the procedure are inserted through the other incisions. The gallbladder is removed through one of the incisions.  After the removal of your gallbladder, the incisions will be closed with stitches, staples, or skin glue. AFTER THE PROCEDURE  You will be taken to a recovery area where your progress will be checked often.  You may  be allowed to go home the same day if your pain is controlled and you can tolerate liquids. Document Released: 04/26/2005 Document Revised: 02/14/2013 Document Reviewed: 12/06/2012 [facility] Patient Information 2015 [employer], [place]. This information is not intended to replace advice given to you by your health care provider. Make sure you discuss any questions you have with your health care provider.

## 2013-12-27 ENCOUNTER — Ambulatory Visit (HOSPITAL_COMMUNITY)
Admission: RE | Admit: 2013-12-27 | Discharge: 2013-12-29 | Disposition: A | Discharge status: Home / Self Care | Payer: 59 | Source: Ambulatory Visit | Attending: General Surgery | Admitting: General Surgery

## 2013-12-27 ENCOUNTER — Ambulatory Visit (HOSPITAL_COMMUNITY): Payer: 59 | Admitting: Anesthesiology

## 2013-12-27 ENCOUNTER — Encounter (HOSPITAL_COMMUNITY)
Admission: RE | Disposition: A | Discharge status: Home / Self Care | Payer: Self-pay | Source: Ambulatory Visit | Attending: General Surgery

## 2013-12-27 ENCOUNTER — Encounter (HOSPITAL_COMMUNITY): Payer: 59 | Admitting: Anesthesiology

## 2013-12-27 ENCOUNTER — Encounter (HOSPITAL_COMMUNITY): Payer: Self-pay | Admitting: *Deleted

## 2013-12-27 DIAGNOSIS — E1165 Type 2 diabetes mellitus with hyperglycemia: Secondary | ICD-10-CM

## 2013-12-27 DIAGNOSIS — R5381 Other malaise: Secondary | ICD-10-CM

## 2013-12-27 DIAGNOSIS — Z91199 Patient's noncompliance with other medical treatment and regimen due to unspecified reason: Secondary | ICD-10-CM

## 2013-12-27 DIAGNOSIS — E785 Hyperlipidemia, unspecified: Secondary | ICD-10-CM

## 2013-12-27 DIAGNOSIS — N760 Acute vaginitis: Secondary | ICD-10-CM

## 2013-12-27 DIAGNOSIS — E559 Vitamin D deficiency, unspecified: Secondary | ICD-10-CM

## 2013-12-27 DIAGNOSIS — E119 Type 2 diabetes mellitus without complications: Secondary | ICD-10-CM | POA: Insufficient documentation

## 2013-12-27 DIAGNOSIS — R768 Other specified abnormal immunological findings in serum: Secondary | ICD-10-CM

## 2013-12-27 DIAGNOSIS — I10 Essential (primary) hypertension: Secondary | ICD-10-CM

## 2013-12-27 DIAGNOSIS — M545 Low back pain, unspecified: Secondary | ICD-10-CM

## 2013-12-27 DIAGNOSIS — F411 Generalized anxiety disorder: Secondary | ICD-10-CM

## 2013-12-27 DIAGNOSIS — F3289 Other specified depressive episodes: Secondary | ICD-10-CM | POA: Insufficient documentation

## 2013-12-27 DIAGNOSIS — R1013 Epigastric pain: Secondary | ICD-10-CM

## 2013-12-27 DIAGNOSIS — K8010 Calculus of gallbladder with chronic cholecystitis without obstruction: Secondary | ICD-10-CM | POA: Diagnosis not present

## 2013-12-27 DIAGNOSIS — R5383 Other fatigue: Secondary | ICD-10-CM

## 2013-12-27 DIAGNOSIS — K8000 Calculus of gallbladder with acute cholecystitis without obstruction: Secondary | ICD-10-CM

## 2013-12-27 DIAGNOSIS — F329 Major depressive disorder, single episode, unspecified: Secondary | ICD-10-CM | POA: Insufficient documentation

## 2013-12-27 DIAGNOSIS — K219 Gastro-esophageal reflux disease without esophagitis: Secondary | ICD-10-CM | POA: Insufficient documentation

## 2013-12-27 DIAGNOSIS — IMO0002 Reserved for concepts with insufficient information to code with codable children: Secondary | ICD-10-CM

## 2013-12-27 DIAGNOSIS — Z9119 Patient's noncompliance with other medical treatment and regimen: Secondary | ICD-10-CM

## 2013-12-27 DIAGNOSIS — R42 Dizziness and giddiness: Secondary | ICD-10-CM

## 2013-12-27 HISTORY — PX: CHOLECYSTECTOMY: SHX55

## 2013-12-27 LAB — GLUCOSE, CAPILLARY
GLUCOSE-CAPILLARY: 160 mg/dL — AB (ref 70–99)
GLUCOSE-CAPILLARY: 173 mg/dL — AB (ref 70–99)
Glucose-Capillary: 202 mg/dL — ABNORMAL HIGH (ref 70–99)
Glucose-Capillary: 185 mg/dL — ABNORMAL HIGH (ref 70–99)

## 2013-12-27 SURGERY — LAPAROSCOPIC CHOLECYSTECTOMY
Anesthesia: General | Site: Abdomen

## 2013-12-27 MED ORDER — OXYCODONE-ACETAMINOPHEN 5-325 MG PO TABS
0.5000 | ORAL_TABLET | Freq: Four times a day (QID) | ORAL | Status: DC | PRN
  Administered 2013-12-27 – 2013-12-28 (×2): 0.5 via ORAL
  Filled 2013-12-27 (×2): qty 1

## 2013-12-27 MED ORDER — LISINOPRIL 10 MG PO TABS
20.0000 mg | ORAL_TABLET | Freq: Every day | ORAL | Status: DC
Start: 2013-12-27 — End: 2013-12-29
  Administered 2013-12-28 – 2013-12-29 (×2): 20 mg via ORAL
  Filled 2013-12-27 (×2): qty 2

## 2013-12-27 MED ORDER — MORPHINE SULFATE 2 MG/ML IJ SOLN
1.0000 mg | INTRAMUSCULAR | Status: DC | PRN
  Administered 2013-12-27: 1 mg via INTRAVENOUS
  Filled 2013-12-27: qty 1

## 2013-12-27 MED ORDER — ROCURONIUM BROMIDE 100 MG/10ML IV SOLN
INTRAVENOUS | Status: DC | PRN
  Administered 2013-12-27: 10 mg via INTRAVENOUS
  Administered 2013-12-27: 25 mg via INTRAVENOUS
  Administered 2013-12-27: 10 mg via INTRAVENOUS

## 2013-12-27 MED ORDER — ONDANSETRON HCL 4 MG/2ML IJ SOLN
4.0000 mg | Freq: Four times a day (QID) | INTRAMUSCULAR | Status: DC | PRN
Start: 2013-12-27 — End: 2013-12-29
  Administered 2013-12-27 – 2013-12-29 (×4): 4 mg via INTRAVENOUS
  Filled 2013-12-27 (×4): qty 2

## 2013-12-27 MED ORDER — MIDAZOLAM HCL 2 MG/2ML IJ SOLN
INTRAMUSCULAR | Status: AC
Start: 2013-12-27 — End: 2013-12-27
  Filled 2013-12-27: qty 2

## 2013-12-27 MED ORDER — ONDANSETRON HCL 4 MG PO TABS: 4.0000 mg | ORAL_TABLET | Freq: Four times a day (QID) | ORAL | Status: DC | PRN

## 2013-12-27 MED ORDER — GLIPIZIDE ER 5 MG PO TB24
20.0000 mg | ORAL_TABLET | Freq: Every day | ORAL | Status: DC
  Administered 2013-12-28 – 2013-12-29 (×2): 20 mg via ORAL
  Filled 2013-12-27 (×2): qty 4

## 2013-12-27 MED ORDER — WATER FOR IRRIGATION, STERILE IR SOLN
Status: DC | PRN
  Administered 2013-12-27 (×2): 1000 mL

## 2013-12-27 MED ORDER — GLYCOPYRROLATE 0.2 MG/ML IJ SOLN
0.2000 mg | Freq: Once | INTRAMUSCULAR | Status: AC
  Administered 2013-12-27: 0.2 mg via INTRAVENOUS

## 2013-12-27 MED ORDER — KETOROLAC TROMETHAMINE 15 MG/ML IJ SOLN
15.0000 mg | Freq: Four times a day (QID) | INTRAMUSCULAR | Status: DC | PRN
  Administered 2013-12-27 – 2013-12-29 (×5): 15 mg via INTRAVENOUS
  Filled 2013-12-27 (×5): qty 1

## 2013-12-27 MED ORDER — INSULIN ASPART 100 UNIT/ML ~~LOC~~ SOLN
0.0000 [IU] | Freq: Three times a day (TID) | SUBCUTANEOUS | Status: DC
  Administered 2013-12-27 – 2013-12-28 (×2): 2 [IU] via SUBCUTANEOUS

## 2013-12-27 MED ORDER — DEXAMETHASONE SODIUM PHOSPHATE 4 MG/ML IJ SOLN
4.0000 mg | Freq: Once | INTRAMUSCULAR | Status: AC
  Administered 2013-12-27: 4 mg via INTRAVENOUS

## 2013-12-27 MED ORDER — GLYCOPYRROLATE 0.2 MG/ML IJ SOLN
INTRAMUSCULAR | Status: AC
  Filled 2013-12-27: qty 4

## 2013-12-27 MED ORDER — GLYCOPYRROLATE 0.2 MG/ML IJ SOLN: INTRAMUSCULAR | Status: DC | PRN

## 2013-12-27 MED ORDER — NEOSTIGMINE METHYLSULFATE 10 MG/10ML IV SOLN: INTRAVENOUS | Status: DC | PRN

## 2013-12-27 MED ORDER — INSULIN ASPART 100 UNIT/ML ~~LOC~~ SOLN: 0.0000 [IU] | Freq: Every day | SUBCUTANEOUS | Status: DC

## 2013-12-27 MED ORDER — PROPOFOL 10 MG/ML IV BOLUS
INTRAVENOUS | Status: DC | PRN
  Administered 2013-12-27: 150 mg via INTRAVENOUS

## 2013-12-27 MED ORDER — INSULIN ASPART 100 UNIT/ML ~~LOC~~ SOLN: 0.0000 [IU] | Freq: Three times a day (TID) | SUBCUTANEOUS | Status: DC

## 2013-12-27 MED ORDER — ONDANSETRON HCL 4 MG/2ML IJ SOLN: 4.0000 mg | Freq: Once | INTRAMUSCULAR | Status: DC | PRN

## 2013-12-27 MED ORDER — LABETALOL HCL 5 MG/ML IV SOLN
INTRAVENOUS | Status: DC | PRN
  Administered 2013-12-27 (×3): 5 mg via INTRAVENOUS

## 2013-12-27 MED ORDER — CANAGLIFLOZIN 300 MG PO TABS
1.0000 | ORAL_TABLET | Freq: Every day | ORAL | Status: DC
  Filled 2013-12-27 (×2): qty 1

## 2013-12-27 MED ORDER — PROPOFOL 10 MG/ML IV BOLUS
INTRAVENOUS | Status: AC
  Filled 2013-12-27: qty 20

## 2013-12-27 MED ORDER — DEXAMETHASONE SODIUM PHOSPHATE 4 MG/ML IJ SOLN
INTRAMUSCULAR | Status: AC
  Filled 2013-12-27: qty 1

## 2013-12-27 MED ORDER — LIDOCAINE HCL (PF) 1 % IJ SOLN
INTRAMUSCULAR | Status: AC
Start: 2013-12-27 — End: 2013-12-27
  Filled 2013-12-27: qty 5

## 2013-12-27 MED ORDER — ARTIFICIAL TEARS OP OINT
TOPICAL_OINTMENT | OPHTHALMIC | Status: AC
  Filled 2013-12-27: qty 3.5

## 2013-12-27 MED ORDER — SIMVASTATIN 20 MG PO TABS
40.0000 mg | ORAL_TABLET | Freq: Every day | ORAL | Status: DC
  Administered 2013-12-27 – 2013-12-28 (×2): 40 mg via ORAL
  Filled 2013-12-27 (×2): qty 2

## 2013-12-27 MED ORDER — GLYCOPYRROLATE 0.2 MG/ML IJ SOLN
INTRAMUSCULAR | Status: DC | PRN
  Administered 2013-12-27: 0.6 mg via INTRAVENOUS

## 2013-12-27 MED ORDER — FENTANYL CITRATE 0.05 MG/ML IJ SOLN: 25.0000 ug | INTRAMUSCULAR | Status: DC | PRN

## 2013-12-27 MED ORDER — ONDANSETRON HCL 4 MG/2ML IJ SOLN
INTRAMUSCULAR | Status: AC
  Filled 2013-12-27: qty 2

## 2013-12-27 MED ORDER — NEOSTIGMINE METHYLSULFATE 10 MG/10ML IV SOLN
INTRAVENOUS | Status: DC | PRN
  Administered 2013-12-27: 4 mg via INTRAVENOUS

## 2013-12-27 MED ORDER — SEVOFLURANE IN SOLN
RESPIRATORY_TRACT | Status: AC
  Filled 2013-12-27: qty 250

## 2013-12-27 MED ORDER — ONDANSETRON HCL 4 MG/2ML IJ SOLN
4.0000 mg | Freq: Once | INTRAMUSCULAR | Status: AC
  Administered 2013-12-27: 4 mg via INTRAVENOUS

## 2013-12-27 MED ORDER — SUCCINYLCHOLINE CHLORIDE 20 MG/ML IJ SOLN
INTRAMUSCULAR | Status: DC | PRN
  Administered 2013-12-27: 100 mg via INTRAVENOUS

## 2013-12-27 MED ORDER — POTASSIUM CHLORIDE IN NACL 20-0.9 MEQ/L-% IV SOLN
INTRAVENOUS | Status: DC
  Administered 2013-12-27 – 2013-12-28 (×4): via INTRAVENOUS

## 2013-12-27 MED ORDER — BUPIVACAINE HCL (PF) 0.5 % IJ SOLN
INTRAMUSCULAR | Status: AC
  Filled 2013-12-27: qty 30

## 2013-12-27 MED ORDER — DEXTROSE 5 % IV SOLN
2.0000 g | INTRAVENOUS | Status: DC
  Administered 2013-12-27: 2 g via INTRAVENOUS
  Filled 2013-12-27: qty 2

## 2013-12-27 MED ORDER — BUSPIRONE HCL 5 MG PO TABS
5.0000 mg | ORAL_TABLET | Freq: Two times a day (BID) | ORAL | Status: DC
  Administered 2013-12-28 – 2013-12-29 (×3): 5 mg via ORAL
  Filled 2013-12-27 (×4): qty 1

## 2013-12-27 MED ORDER — EPHEDRINE SULFATE 50 MG/ML IJ SOLN
INTRAMUSCULAR | Status: AC
  Filled 2013-12-27: qty 1

## 2013-12-27 MED ORDER — FENTANYL CITRATE 0.05 MG/ML IJ SOLN
INTRAMUSCULAR | Status: AC
  Filled 2013-12-27: qty 2

## 2013-12-27 MED ORDER — GLYCOPYRROLATE 0.2 MG/ML IJ SOLN
INTRAMUSCULAR | Status: AC
  Filled 2013-12-27: qty 1

## 2013-12-27 MED ORDER — BACITRACIN-NEOMYCIN-POLYMYXIN 400-5-5000 EX OINT
TOPICAL_OINTMENT | CUTANEOUS | Status: AC
  Filled 2013-12-27: qty 1

## 2013-12-27 MED ORDER — LACTATED RINGERS IV SOLN
INTRAVENOUS | Status: DC | PRN
  Administered 2013-12-27 (×2): via INTRAVENOUS

## 2013-12-27 MED ORDER — SODIUM CHLORIDE 0.9 % IR SOLN
Status: DC | PRN
  Administered 2013-12-27: 1000 mL

## 2013-12-27 MED ORDER — BUPIVACAINE HCL (PF) 0.5 % IJ SOLN
INTRAMUSCULAR | Status: DC | PRN
  Administered 2013-12-27: 9 mL

## 2013-12-27 MED ORDER — SUCCINYLCHOLINE CHLORIDE 20 MG/ML IJ SOLN
INTRAMUSCULAR | Status: AC
  Filled 2013-12-27: qty 1

## 2013-12-27 MED ORDER — LACTATED RINGERS IV SOLN
INTRAVENOUS | Status: DC
  Administered 2013-12-27: 1000 mL via INTRAVENOUS

## 2013-12-27 MED ORDER — SIMETHICONE 80 MG PO CHEW: 80.0000 mg | CHEWABLE_TABLET | Freq: Three times a day (TID) | ORAL | Status: DC | PRN

## 2013-12-27 MED ORDER — LABETALOL HCL 5 MG/ML IV SOLN
INTRAVENOUS | Status: AC
  Filled 2013-12-27: qty 4

## 2013-12-27 MED ORDER — MORPHINE SULFATE 2 MG/ML IJ SOLN: 0.5000 mg | INTRAMUSCULAR | Status: DC | PRN

## 2013-12-27 MED ORDER — SODIUM CHLORIDE 0.9 % IJ SOLN
INTRAMUSCULAR | Status: AC
  Filled 2013-12-27: qty 10

## 2013-12-27 MED ORDER — FENTANYL CITRATE 0.05 MG/ML IJ SOLN
INTRAMUSCULAR | Status: AC
  Filled 2013-12-27: qty 5

## 2013-12-27 MED ORDER — FENTANYL CITRATE 0.05 MG/ML IJ SOLN
INTRAMUSCULAR | Status: DC | PRN
  Administered 2013-12-27: 100 ug via INTRAVENOUS
  Administered 2013-12-27 (×8): 50 ug via INTRAVENOUS

## 2013-12-27 MED ORDER — HEMOSTATIC AGENTS (NO CHARGE) OPTIME
TOPICAL | Status: DC | PRN
  Administered 2013-12-27: 2

## 2013-12-27 MED ORDER — CANAGLIFLOZIN 100 MG PO TABS
300.0000 mg | ORAL_TABLET | Freq: Every day | ORAL | Status: DC
  Administered 2013-12-27 – 2013-12-29 (×3): 300 mg via ORAL
  Filled 2013-12-27 (×5): qty 3

## 2013-12-27 MED ORDER — ROCURONIUM BROMIDE 50 MG/5ML IV SOLN
INTRAVENOUS | Status: AC
  Filled 2013-12-27: qty 1

## 2013-12-27 MED ORDER — LORAZEPAM 0.5 MG PO TABS
0.5000 mg | ORAL_TABLET | Freq: Every evening | ORAL | Status: DC | PRN
  Administered 2013-12-27 – 2013-12-29 (×2): 0.5 mg via ORAL
  Filled 2013-12-27 (×2): qty 1

## 2013-12-27 MED ORDER — MIDAZOLAM HCL 2 MG/2ML IJ SOLN
1.0000 mg | INTRAMUSCULAR | Status: DC | PRN
  Administered 2013-12-27: 2 mg via INTRAVENOUS

## 2013-12-27 MED ORDER — LIDOCAINE HCL (CARDIAC) 20 MG/ML IV SOLN
INTRAVENOUS | Status: DC | PRN
  Administered 2013-12-27: 50 mg via INTRAVENOUS

## 2013-12-27 SURGICAL SUPPLY — 63 items
APPLICATOR COTTON TIP 6IN STRL (MISCELLANEOUS) ×2 IMPLANT
APPLIER CLIP LAPSCP 10X32 DD (CLIP) ×2 IMPLANT
APPLIER CLIP ROT 13.4 12 LRG (CLIP) ×2
APR CLP LRG 13.4X12 ROT 20 MLT (CLIP) ×1
BAG HAMPER (MISCELLANEOUS) ×2 IMPLANT
BAG SPEC RTRVL LRG 6X4 10 (ENDOMECHANICALS) ×1
BLADE 15 SAFETY STRL DISP (BLADE) ×2 IMPLANT
CLIP APPLIE ROT 13.4 12 LRG (CLIP) IMPLANT
CLOTH BEACON ORANGE TIMEOUT ST (SAFETY) ×2 IMPLANT
COVER LIGHT HANDLE STERIS (MISCELLANEOUS) ×4 IMPLANT
DECANTER SPIKE VIAL GLASS SM (MISCELLANEOUS) ×2 IMPLANT
DISSECTOR BLUNT TIP ENDO 5MM (MISCELLANEOUS) ×3 IMPLANT
DRSG TEGADERM 2-3/8X2-3/4 SM (GAUZE/BANDAGES/DRESSINGS) ×6 IMPLANT
DRSG TEGADERM 4X10 (GAUZE/BANDAGES/DRESSINGS) ×3 IMPLANT
ELECT REM PT RETURN 9FT ADLT (ELECTROSURGICAL) ×2
ELECTRODE REM PT RTRN 9FT ADLT (ELECTROSURGICAL) ×1 IMPLANT
EVACUATOR DRAINAGE 10X20 100CC (DRAIN) ×1 IMPLANT
EVACUATOR SILICONE 100CC (DRAIN) ×2
FILTER SMOKE EVAC LAPAROSHD (FILTER) ×2 IMPLANT
FORMALIN 10 PREFIL 120ML (MISCELLANEOUS) ×2 IMPLANT
GAUZE SPONGE 4X4 12PLY STRL (GAUZE/BANDAGES/DRESSINGS) ×2 IMPLANT
GAUZE SPONGE 4X4 16PLY XRAY LF (GAUZE/BANDAGES/DRESSINGS) ×1 IMPLANT
GLOVE BIOGEL PI IND STRL 7.0 (GLOVE) IMPLANT
GLOVE BIOGEL PI IND STRL 7.5 (GLOVE) IMPLANT
GLOVE BIOGEL PI INDICATOR 7.0 (GLOVE) ×2
GLOVE BIOGEL PI INDICATOR 7.5 (GLOVE) ×1
GLOVE ECLIPSE 6.5 STRL STRAW (GLOVE) ×1 IMPLANT
GLOVE EXAM NITRILE LRG STRL (GLOVE) ×1 IMPLANT
GLOVE SKINSENSE NS SZ7.0 (GLOVE) ×1
GLOVE SKINSENSE STRL SZ7.0 (GLOVE) ×1 IMPLANT
GLOVE SURG SS PI 7.5 STRL IVOR (GLOVE) ×1 IMPLANT
GOWN STRL REUS W/TWL LRG LVL3 (GOWN DISPOSABLE) ×6 IMPLANT
HEMOSTAT SURGICEL 4X8 (HEMOSTASIS) ×3 IMPLANT
INST SET LAPROSCOPIC AP (KITS) ×2 IMPLANT
IV NS IRRIG 3000ML ARTHROMATIC (IV SOLUTION) ×2 IMPLANT
KIT CLEAN CATCH URINE (SET/KITS/TRAYS/PACK) ×1 IMPLANT
KIT ROOM TURNOVER APOR (KITS) ×2 IMPLANT
MANIFOLD NEPTUNE II (INSTRUMENTS) ×2 IMPLANT
NDL INSUFFLATION 14GA 120MM (NEEDLE) ×1 IMPLANT
NEEDLE INSUFFLATION 14GA 120MM (NEEDLE) ×2 IMPLANT
NS IRRIG 1000ML POUR BTL (IV SOLUTION) ×2 IMPLANT
PACK LAP CHOLE LZT030E (CUSTOM PROCEDURE TRAY) ×2 IMPLANT
PAD ARMBOARD 7.5X6 YLW CONV (MISCELLANEOUS) ×2 IMPLANT
POUCH SPECIMEN RETRIEVAL 10MM (ENDOMECHANICALS) ×2 IMPLANT
SET BASIN LINEN APH (SET/KITS/TRAYS/PACK) ×2 IMPLANT
SET TUBE IRRIG SUCTION NO TIP (IRRIGATION / IRRIGATOR) ×2 IMPLANT
SLEEVE ENDOPATH XCEL 5M (ENDOMECHANICALS) ×2 IMPLANT
SOL PREP PROV IODINE SCRUB 4OZ (MISCELLANEOUS) ×2 IMPLANT
SPONGE DRAIN TRACH 4X4 STRL 2S (GAUZE/BANDAGES/DRESSINGS) ×2 IMPLANT
SPONGE GAUZE 4X4 12PLY (GAUZE/BANDAGES/DRESSINGS) ×1 IMPLANT
STAPLER VISISTAT 35W (STAPLE) ×2 IMPLANT
SUT ETHILON 3 0 FSL (SUTURE) ×2 IMPLANT
SUT VICRYL 0 UR6 27IN ABS (SUTURE) ×3 IMPLANT
TAPE CLOTH SURG 4X10 WHT LF (GAUZE/BANDAGES/DRESSINGS) ×1 IMPLANT
TOWEL OR 17X26 4PK STRL BLUE (TOWEL DISPOSABLE) ×2 IMPLANT
TRAY FOLEY CATH 16FR SILVER (SET/KITS/TRAYS/PACK) ×2 IMPLANT
TROCAR ENDO BLADELESS 11MM (ENDOMECHANICALS) ×2 IMPLANT
TROCAR ENDO BLADELESS 12MM (ENDOMECHANICALS) ×1 IMPLANT
TROCAR XCEL NON-BLD 5MMX100MML (ENDOMECHANICALS) ×2 IMPLANT
TROCAR XCEL UNIV SLVE 11M 100M (ENDOMECHANICALS) ×2 IMPLANT
TUBING INSUFFLATION HIGH FLOW (TUBING) ×2 IMPLANT
WARMER LAPAROSCOPE (MISCELLANEOUS) ×2 IMPLANT
WATER STERILE IRR 1000ML POUR (IV SOLUTION) ×4 IMPLANT

## 2013-12-27 NOTE — Anesthesia Preprocedure Evaluation (Signed)
Anesthesia Evaluation  Patient identified by MRN, date of birth, ID band Patient awake    Reviewed: Allergy & Precautions, H&P , NPO status , Patient's Chart, lab work & pertinent test results  Airway Mallampati: II TM Distance: >3 FB Neck ROM: Full    Dental  (+) Teeth Intact   Pulmonary neg pulmonary ROS,  breath sounds clear to auscultation        Cardiovascular hypertension, Pt. on medications Rhythm:Regular Rate:Normal     Neuro/Psych  Headaches, PSYCHIATRIC DISORDERS Anxiety Depression    GI/Hepatic GERD-  Medicated and Poorly Controlled,  Endo/Other  diabetes, Well Controlled, Type 2, Oral Hypoglycemic Agents  Renal/GU      Musculoskeletal   Abdominal   Peds  Hematology   Anesthesia Other Findings   Reproductive/Obstetrics                           Anesthesia Physical Anesthesia Plan  ASA: III  Anesthesia Plan: General   Post-op Pain Management:    Induction: Intravenous, Rapid sequence and Cricoid pressure planned  Airway Management Planned: Oral ETT  Additional Equipment:   Intra-op Plan:   Post-operative Plan: Extubation in OR  Informed Consent: I have reviewed the patients History and Physical, chart, labs and discussed the procedure including the risks, benefits and alternatives for the proposed anesthesia with the patient or authorized representative who has indicated his/her understanding and acceptance.     Plan Discussed with:   Anesthesia Plan Comments:         Anesthesia Quick Evaluation

## 2013-12-27 NOTE — Op Note (Signed)
[name], [name] Fernandez NO.:  [id number]  MEDICAL RECORD NO.:  [id number]  LOCATION:  [facility]                          FACILITY:  [facility]  PHYSICIAN:  Barbaraann Barthel, M.D. DATE OF BIRTH:  1975/07/26  DATE OF PROCEDURE:  12/27/2013 DATE OF DISCHARGE:                              OPERATIVE REPORT   DIAGNOSIS:  Acute cholecystitis and cholelithiasis.  PROCEDURE:  Laparoscopic cholecystectomy.  SPECIMEN:  Gallbladder and stones.  WOUND CLASSIFICATION:  Clean, contaminated.  NOTE:  This is a 38 year old African American female, who had approximately a 3-year history of recurrent right upper quadrant pain with nausea and vomiting.  A sonogram revealed the presence of multiple stones.  Her liver function studies were grossly within normal limits. 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 We scheduled her via the outpatient department.  I placed her on a restrictive diet.  However, she had a little dietary indiscretion the night prior to surgery and when I saw her in the Outpatient Surgery Department the day of surgery, she had abdominal pain with nausea that was within the last 12 hours.  She was taken to Surgery.  We had discussed preoperatively complications, not limited to, but including bleeding, infection, damage to bile ducts, perforation of organs, transitory diarrhea, and the possibility of open surgery might be required.  Informed consent was obtained.  DESCRIPTION OF PROCEDURE:  She was taken to the operating room.  She was prepped with Betadine solution and draped in usual manner.  A periumbilical incision was carried out over the superior aspect of the umbilicus.  The fascia was grasped with a sharp towel clip and a Veress needle was inserted.  We then insufflated the abdomen with  approximately 3.5 liters of CO2 and then under direct vision, placed an 11-mm cannula in the epigastrium and two 5-mm cannulas in the right upper quadrant laterally.  The gallbladder was grasped, its adhesions were taken down. She had obvious acute edema of the distal gallbladder and actually the gallbladder itself.  With tedious dissection, identified the cystic duct, this had no stones palpated within it.  We then clipped this and divided it as we did the cystic artery.  We had a little trouble with the clip applier device; however, this was finally functioning correctly.  We divided the cystic duct and then with using the hook cautery device, we were able to dissect the gallbladder out of the liver bed.  It was slightly intrahepatic; however, the edema helped [place] a little bit in the dissection and we dissected the gallbladder basically down from the fundus towards the cystic ducts during our previous portion when we identified the cystic artery and cystic duct.  Once we were unable to move the gallbladder through the incision without removing multiple stones, she had at least 30 stones or so within the gallbladder.  Once we were able to decompress the gallbladder of the multiple stones, we were able to take this out without any problem.  We  then checked for hemostasis, irrigated the liver bed, and then I elected to leave two pieces of Surgicel on the liver bed as well as a Al PimpleJackson- Pratt drain, which exited through the lateral most port site.  We then desufflated the abdomen and I closed the fascia in the area of the umbilicus and the epigastrium with 0 Polysorb sutures in the skin with a stapling device.  I used 0.5% Sensorcaine at all port sites for postoperative comfort and sewed the drain in place with 3-0 nylon. Prior to closure, all sponge, needle, and instrument counts were found to be correct.  Estimated blood loss was minimal.  The patient received approximately 1600 mL of  crystalloids intraoperatively.  There were approximately 50 mL of blood loss.  There were no complications.     Barbaraann BarthelWilliam Teshawn Moan, M.D.     WB/MEDQ  D:  12/27/2013  T:  12/27/2013  Job:  [id number]  cc:   Milus MallickMargaret E. Lodema HongSimpson, M.D. Fax: [phone]

## 2013-12-27 NOTE — Progress Notes (Signed)
Late entry [id number]:  RN called to patient's room.  Patient stated that she "felt hot all over and dizzy".  Stated that she thought it was the Morphine.  Stated that she was "sensitive to pain medication."  States that she "does not want to take the Morphine."  VS obtained (see flowsheet).  Patient alert and oriented and talking.  No SOB.  Dr. Malvin JohnsBradford paged and notified of episode.  Gave order to decrease dose of Morphine.  Also gave order for Toradol 15 mg IV.  Orders followed.  Will continue to monitor.

## 2013-12-27 NOTE — Anesthesia Postprocedure Evaluation (Signed)
  Anesthesia Post-op Note  Patient: [name] Fernandez  Procedure(s) Performed: Procedure(s): LAPAROSCOPIC CHOLECYSTECTOMY  (N/A)  Patient Location: PACU  Anesthesia Type:General  Level of Consciousness: awake, alert , oriented and patient cooperative  Airway and Oxygen Therapy: Patient Spontanous Breathing and Patient connected to face mask oxygen  Post-op Pain: none  Post-op Assessment: Post-op Vital signs reviewed, Patient's Cardiovascular Status Stable, Respiratory Function Stable, Patent Airway, No signs of Nausea or vomiting and Pain level controlled  Post-op Vital Signs: Reviewed and stable  Last Vitals:  Filed Vitals:   12/27/13 1200  BP: 108/65  Pulse:   Temp:   Resp:     Complications: No apparent anesthesia complications

## 2013-12-27 NOTE — Transfer of Care (Signed)
Immediate Anesthesia Transfer of Care Note  Patient: [name] Fernandez  Procedure(s) Performed: Procedure(s): LAPAROSCOPIC CHOLECYSTECTOMY  (N/A)  Patient Location: PACU  Anesthesia Type:General  Level of Consciousness: awake, alert , oriented and patient cooperative  Airway & Oxygen Therapy: Patient Spontanous Breathing and Patient connected to face mask oxygen  Post-op Assessment: Report given to PACU RN and Post -op Vital signs reviewed and stable  Post vital signs: Reviewed and stable  Complications: No apparent anesthesia complications

## 2013-12-27 NOTE — Progress Notes (Signed)
Post OP Check  Filed Vitals:   12/27/13 1431  BP: 122/79  Pulse: 111  Temp: 98.8 F (37.1 C)  Resp:    Pt awake but drowsy.  Dressings dry and in tact.  Minimal JP drainage brownish from surgicel. Pain under control.  BS 202; will be covered with sliding scale.  Doing well post op.

## 2013-12-27 NOTE — Progress Notes (Signed)
38 yr old [place] female for lap GB surgery for cholecystitis secondary to cholelithiasis.  Pt has been having symptoms for ~ 3 yrs.  Has had recent onset of pain this AM despite dietary restrictions.  Will plan for surgery today.  Have reviewed labs and discussed surgery and risks in detail with pt and family and informed consent obtained.  Filed Vitals:   12/27/13 0905  BP: 104/69  Pulse:   Temp:   Resp: 15  pulse 79, temp 98.6  O2 sat 99%  Fasting BS 160

## 2013-12-27 NOTE — Progress Notes (Signed)
Patient's Toradol given at 1700 and not due again for 6 hours.  Patient requesting small dose of pain medication and Simethicone tablets.  Patient has ambulated on unit twice.  Tolerated well.  Dr. Malvin JohnsBradford paged.  Returned page and gave order for patient to receive Percocet 5-325 mg 0.5 tablets every 6 hours as needed for pain.  Also gave order for Simethicone tablets three times a day as needed for gas.  Orders followed.

## 2013-12-27 NOTE — Brief Op Note (Signed)
12/27/2013  11:39 AM  PATIENT:  Ardeen Garlandamika C Fetterly  38 y.o. female  PRE-OPERATIVE DIAGNOSIS:  cholelithiasis  POST-OPERATIVE DIAGNOSIS:  cholelithiasis  PROCEDURE:  Procedure(s): LAPAROSCOPIC CHOLECYSTECTOMY  (N/A)  SURGEON:  Surgeon(s) and Role:    * Marlane HatcherWilliam S Sairah Knobloch, MD - Primary  PHYSICIAN ASSISTANT:   ASSISTANTS: none   ANESTHESIA:   general  EBL:  Total I/O In: 1600 [I.V.:1600] Out: 1050 [Urine:1000; Blood:50]  BLOOD ADMINISTERED:none  DRAINS: Penrose drain in the in the liver bed.   LOCAL MEDICATIONS USED:  MARCAINE 0.5% ~ 10 cc   SPECIMEN:  Source of Specimen:  gall bladder and numerous stones.  DISPOSITION OF SPECIMEN:  PATHOLOGY  COUNTS:  YES  TOURNIQUET:  * No tourniquets in log *  DICTATION: .Other Dictation: Dictation Number OR dict. # [id number].  PLAN OF CARE: Admit for overnight observation  PATIENT DISPOSITION:  PACU - hemodynamically stable.   Delay start of Pharmacological VTE agent (>24hrs) due to surgical blood loss or risk of bleeding: not applicable

## 2013-12-27 NOTE — Anesthesia Procedure Notes (Signed)
Procedure Name: Intubation Date/Time: 12/27/2013 9:32 AM Performed by: Pernell DupreADAMS, Gerhard Rappaport A Pre-anesthesia Checklist: Patient identified, Patient being monitored, Timeout performed, Emergency Drugs available and Suction available Patient Re-evaluated:Patient Re-evaluated prior to inductionOxygen Delivery Method: Circle System Utilized Preoxygenation: Pre-oxygenation with 100% oxygen Intubation Type: IV induction, Rapid sequence and Cricoid Pressure applied Laryngoscope Size: 3 and Miller Grade View: Grade I Tube type: Oral Tube size: 7.0 mm Number of attempts: 1 Airway Equipment and Method: stylet Placement Confirmation: ETT inserted through vocal cords under direct vision,  positive ETCO2 and breath sounds checked- equal and bilateral Secured at: 21 cm Tube secured with: Tape Dental Injury: Teeth and Oropharynx as per pre-operative assessment

## 2013-12-27 NOTE — Plan of Care (Signed)
Problem: Phase I Progression Outcomes Goal: OOB as tolerated unless otherwise ordered Outcome: Progressing Patient ambulated to bathroom.  Tolerated well.  Currently resting in bed.

## 2013-12-28 ENCOUNTER — Encounter (HOSPITAL_COMMUNITY): Payer: Self-pay | Admitting: General Surgery

## 2013-12-28 DIAGNOSIS — K8010 Calculus of gallbladder with chronic cholecystitis without obstruction: Secondary | ICD-10-CM | POA: Diagnosis not present

## 2013-12-28 LAB — BASIC METABOLIC PANEL
Anion gap: 10 (ref 5–15)
BUN: 10 mg/dL (ref 6–23)
CALCIUM: 8.8 mg/dL (ref 8.4–10.5)
CO2: 27 mEq/L (ref 19–32)
CREATININE: 0.65 mg/dL (ref 0.50–1.10)
Chloride: 104 mEq/L (ref 96–112)
GFR calc non Af Amer: >90 mL/min (ref >90)
Glucose, Bld: 117 mg/dL — ABNORMAL HIGH (ref 70–99)
Potassium: 4.7 mEq/L (ref 3.7–5.3)
Sodium: 141 mEq/L (ref 137–147)

## 2013-12-28 LAB — CBC
HEMATOCRIT: 36.3 % (ref 36.0–46.0)
Hemoglobin: 12.2 g/dL (ref 12.0–15.0)
MCH: 26.7 pg (ref 26.0–34.0)
MCHC: 33.6 g/dL (ref 30.0–36.0)
MCV: 79.4 fL (ref 78.0–100.0)
Platelets: 385 10*3/uL (ref 150–400)
RBC: 4.57 MIL/uL (ref 3.87–5.11)
RDW: 13.8 % (ref 11.5–15.5)
WBC: 12 10*3/uL — ABNORMAL HIGH (ref 4.0–10.5)

## 2013-12-28 LAB — GLUCOSE, CAPILLARY
GLUCOSE-CAPILLARY: 99 mg/dL (ref 70–99)
GLUCOSE-CAPILLARY: 77 mg/dL (ref 70–99)
Glucose-Capillary: 100 mg/dL — ABNORMAL HIGH (ref 70–99)
Glucose-Capillary: 172 mg/dL — ABNORMAL HIGH (ref 70–99)

## 2013-12-28 LAB — HEPATIC FUNCTION PANEL
ALT: 40 U/L — ABNORMAL HIGH (ref 0–35)
AST: 41 U/L — AB (ref 0–37)
Albumin: 3.3 g/dL — ABNORMAL LOW (ref 3.5–5.2)
Alkaline Phosphatase: 66 U/L (ref 39–117)
BILIRUBIN TOTAL: 0.4 mg/dL (ref 0.3–1.2)
Total Protein: 7.4 g/dL (ref 6.0–8.3)

## 2013-12-28 MED ORDER — DEXTROSE 5 % IV SOLN
1.0000 g | Freq: Once | INTRAVENOUS | Status: AC
  Administered 2013-12-29: 1 g via INTRAVENOUS
  Filled 2013-12-28: qty 10

## 2013-12-28 MED ORDER — DEXTROSE 5 % IV SOLN
1.0000 g | Freq: Once | INTRAVENOUS | Status: AC
  Administered 2013-12-28: 1 g via INTRAVENOUS
  Filled 2013-12-28: qty 10

## 2013-12-28 MED ORDER — OXYCODONE-ACETAMINOPHEN 5-325 MG PO TABS
0.5000 | ORAL_TABLET | ORAL | Status: DC | PRN
  Administered 2013-12-28 – 2013-12-29 (×4): 0.5 via ORAL
  Filled 2013-12-28 (×4): qty 1

## 2013-12-28 NOTE — Progress Notes (Signed)
Filed Vitals:   12/28/13 1503  BP: 105/69  Pulse: 85  Temp: 98.5 F (36.9 C)  Resp: 17  Abdomen remains soft.  No change in JP out put--still serosanguinous in nature.  Wound clean.  Pt feels nausea.  Considering her acute cholecystitis, I will continue parenteral antibiotics and discharge in AM.

## 2013-12-28 NOTE — Anesthesia Postprocedure Evaluation (Signed)
  Anesthesia Post-op Note  Patient: [name] Fernandez  Procedure(s) Performed: Procedure(s): LAPAROSCOPIC CHOLECYSTECTOMY  (N/A)  Patient Location: PACU  Anesthesia Type:General  Level of Consciousness: awake and patient cooperative  Airway and Oxygen Therapy: Patient Spontanous Breathing  Post-op Pain: mild  Post-op Assessment: Post-op Vital signs reviewed, Patient's Cardiovascular Status Stable, Respiratory Function Stable and No signs of Nausea or vomiting  Post-op Vital Signs: Reviewed and stable  Last Vitals:  Filed Vitals:   12/28/13 0533  BP: 110/70  Pulse: 102  Temp: 37.1 C  Resp: 19    Complications: No apparent anesthesia complications

## 2013-12-28 NOTE — Care Management Note (Signed)
    Page 1 of 1   12/28/2013     2:18:26 PM CARE MANAGEMENT NOTE 12/28/2013  Patient:  [name] Fernandez,[name] Fernandez   Account Number:  [id number]  Date Initiated:  12/28/2013  Documentation initiated by:  CHILDRESS,JESSICA  Subjective/Objective Assessment:   Pt OIB following lab chole. Pt from home with self care.     Action/Plan:   Pt plans to discharge home with self care today. Pt has no CM needs at this time.   Anticipated DC Date:  12/28/2013   Anticipated DC Plan:  HOME/SELF CARE      DC Planning Services  CM consult      Choice offered to / List presented to:             Status of service:  Completed, signed off Medicare Important Message given?   (If response is "NO", the following Medicare IM given date fields will be blank) Date Medicare IM given:   Medicare IM given by:   Date Additional Medicare IM given:   Additional Medicare IM given by:    Discharge Disposition:  HOME/SELF CARE  Per UR Regulation:    If discussed at Long Length of Stay Meetings, dates discussed:    Comments:  12/28/2013 1000 Kathyrn SheriffJessica Childress, RN, MSN, [facility]

## 2013-12-28 NOTE — Addendum Note (Signed)
Addendum created 12/28/13 0741 by Franco Noneseresa S Tarence Searcy, CRNA   Modules edited: Notes Section   Notes Section:  File: [id number]

## 2013-12-28 NOTE — Progress Notes (Signed)
POD #1  Filed Vitals:   12/28/13 0533  BP: 110/70  Pulse: 102  Temp: 98.7 F (37.1 C)  Resp: 19    Abdomen very soft.  Wounds clean and redressed and min. JP serosanguinous drainage.  Voiding well with some expected incisional pain.  Voiding well.  Labs OK with no bump noted in bili.  Will advance to full liquid and discharge today.  Glucose 100.

## 2013-12-29 DIAGNOSIS — K8010 Calculus of gallbladder with chronic cholecystitis without obstruction: Secondary | ICD-10-CM | POA: Diagnosis not present

## 2013-12-29 LAB — CBC
HCT: 32 % — ABNORMAL LOW (ref 36.0–46.0)
Hemoglobin: 10.7 g/dL — ABNORMAL LOW (ref 12.0–15.0)
MCH: 26.7 pg (ref 26.0–34.0)
MCHC: 33.4 g/dL (ref 30.0–36.0)
MCV: 79.8 fL (ref 78.0–100.0)
Platelets: 326 10*3/uL (ref 150–400)
RBC: 4.01 MIL/uL (ref 3.87–5.11)
RDW: 13.7 % (ref 11.5–15.5)
WBC: 8.8 10*3/uL (ref 4.0–10.5)

## 2013-12-29 LAB — GLUCOSE, CAPILLARY
GLUCOSE-CAPILLARY: 108 mg/dL — AB (ref 70–99)
Glucose-Capillary: 97 mg/dL (ref 70–99)
Glucose-Capillary: 145 mg/dL — ABNORMAL HIGH (ref 70–99)

## 2013-12-29 LAB — BASIC METABOLIC PANEL
Anion gap: 10 (ref 5–15)
BUN: 7 mg/dL (ref 6–23)
CHLORIDE: 105 meq/L (ref 96–112)
CO2: 25 mEq/L (ref 19–32)
Calcium: 8.4 mg/dL (ref 8.4–10.5)
Creatinine, Ser: 0.57 mg/dL (ref 0.50–1.10)
GFR calc Af Amer: >90 mL/min (ref >90)
GLUCOSE: 86 mg/dL (ref 70–99)
POTASSIUM: 4.3 meq/L (ref 3.7–5.3)
SODIUM: 140 meq/L (ref 137–147)

## 2013-12-29 MED ORDER — OXYCODONE-ACETAMINOPHEN 10-325 MG PO TABS: 1.0000 | ORAL_TABLET | ORAL | Status: DC | PRN

## 2013-12-29 NOTE — Progress Notes (Signed)
POD # 2  Filed Vitals:   12/29/13 0758  BP: 116/74  Pulse:   Temp:   Resp:   pulse 89, temp 98.6, resp 12,   Blood sugar 108  Pt feeling much better.  Nausea has abated and she tolerated PO well.  Pain and glycemia under control.   Wound clean and redressed and there was minimal JP drainage which was serosanguinous.  The drain was removed.  Doing well post op.  Discharge and follow up arranged.   Dict. #  [id number].

## 2013-12-29 NOTE — Discharge Summary (Signed)
NAMMontez Fernandez:  Fernandez, [name]              ACCOUNT NO.:  [id number]  MEDICAL RECORD NO.:  [id number]  LOCATION:  A333                          FACILITY:  [facility]  PHYSICIAN:  Barbaraann BarthelWilliam Bryann Gentz, M.D. DATE OF BIRTH:  [birth year]  DATE OF ADMISSION:  12/27/2013 DATE OF DISCHARGE:  08/22/2015LH                              DISCHARGE SUMMARY   PROCEDURE:  On December 27, 2013, laparoscopic cholecystectomy.  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  She was found to have on sonography multiple gallstones.  Her liver functions preoperatively were grossly within normal limits.  She was placed on a restrictive diet and we planned to do a laparoscopic cholecystectomy as an outpatient on an elective basis.  Unfortunately, the patient had acute cholecystitis which presented on the actual day of surgery.  She was taken to surgery at which time an acutely edematous gallbladder was encountered.  There were multiple stones within the gallbladder.  Laparoscopic cholecystectomy, however, was able to be done without any problems.  We did leave a drain in place, and we kept her in the hospital for a couple of days on parenteral antibiotics. Postoperatively, her course was really rather unremarkable. She had some incisional pain which abated.  Her blood sugars and her pain were kept under control and she was discharged on the second postoperative day. At that time, her wounds were clean.  She had minimal serous drainage from her Jackson-Pratt drain.  The drain was removed prior to her discharge, and she had no leg pain, shortness of breath, or dysuria, or chest pain.  We made arrangements for follow up with her, and we will follow her perioperatively after which she is to return to Dr. Lodema Fernandez, who is her family doctor.  Postoperatively, her labs were grossly within normal limits.  She had no bump in her bilirubin, a  slight bump in her liver function studies which was consistent with the surgery.  However, she had no signs of any choledocholithiasis clinically.  We will follow up with her perioperatively after which she will follow up with Dr. Lodema Fernandez.  She is told to go to the emergency room or call me should there be any acute changes.  I gave her my cell phone number.     Barbaraann BarthelWilliam [name] Fernandez, M.D.     WB/MEDQ  D:  12/29/2013  T:  12/29/2013  Job:  [id number]  cc:   Dr. Lodema Fernandez

## 2013-12-31 ENCOUNTER — Telehealth: Payer: Self-pay | Admitting: Family Medicine

## 2013-12-31 MED ORDER — PAROXETINE HCL 10 MG PO TABS: 10.0000 mg | ORAL_TABLET | Freq: Every day | ORAL | Status: DC

## 2013-12-31 NOTE — Telephone Encounter (Signed)
States ever since she was here last time and diagnosed with GAD she has been a nervous wreck. The buspar isn't helping and she is being taken over by anxiety. Can't eat, wakes up all during the night, feels impending doom and heart races. Needs something to calm her down. Please advise

## 2013-12-31 NOTE — Telephone Encounter (Signed)
Spoke with patient who states that she has only been taking 1/2 of buspar dose.  She will increase to appropriate dose and then increase to TID.  Is open to trying Paxil.  Rx sent to pharmacy.   Patient states that she thinks that an increase in anxiety is coming from mobility being limited after surgery.  She is use to being able to get out and do more.  She follows up with Dr. Malvin Johns on 8/27.   She states that post surgery she has a decrease in appetite and blood sugars having been staying down.  She is holding meds according to what blood sugar is.

## 2013-12-31 NOTE — Addendum Note (Signed)
Addended by: Kandis Fantasia B on: 12/31/2013 12:11 PM   Modules accepted: Orders

## 2013-12-31 NOTE — Telephone Encounter (Signed)
noted 

## 2013-12-31 NOTE — Telephone Encounter (Signed)
Advise her to increase the buspar to 3 times daily, every 8 hours on schedule and start as an addition paxil 10 mg one daily, pls send in the paxil if she agrees. Advise these are the 2 first line drugs for anxiety,and they are not habit forming. Therefore safe.  xanax, that people talk about is a "quick fix" but not a "go to "drug  At this time  Give the new combo about 2 weeks to kick in , Ok to call back if this is still a big problem I am willing to send in a LIMITED supply of xanaxfor short term use  Pls also verify tht she actually has her diabetes meds, also the cholesterol med and is taking them as prescribed

## 2014-01-01 ENCOUNTER — Emergency Department (HOSPITAL_COMMUNITY)
Admission: EM | Admit: 2014-01-01 | Discharge: 2014-01-02 | Disposition: A | Discharge status: Home / Self Care | Payer: 59 | Attending: Emergency Medicine | Admitting: Emergency Medicine

## 2014-01-01 ENCOUNTER — Telehealth: Payer: Self-pay

## 2014-01-01 ENCOUNTER — Encounter (HOSPITAL_COMMUNITY): Payer: Self-pay | Admitting: Emergency Medicine

## 2014-01-01 ENCOUNTER — Other Ambulatory Visit: Payer: Self-pay

## 2014-01-01 DIAGNOSIS — I10 Essential (primary) hypertension: Secondary | ICD-10-CM | POA: Diagnosis not present

## 2014-01-01 DIAGNOSIS — Z8719 Personal history of other diseases of the digestive system: Secondary | ICD-10-CM | POA: Diagnosis not present

## 2014-01-01 DIAGNOSIS — E785 Hyperlipidemia, unspecified: Secondary | ICD-10-CM | POA: Diagnosis not present

## 2014-01-01 DIAGNOSIS — R002 Palpitations: Secondary | ICD-10-CM | POA: Insufficient documentation

## 2014-01-01 DIAGNOSIS — F329 Major depressive disorder, single episode, unspecified: Secondary | ICD-10-CM | POA: Insufficient documentation

## 2014-01-01 DIAGNOSIS — F411 Generalized anxiety disorder: Secondary | ICD-10-CM | POA: Insufficient documentation

## 2014-01-01 DIAGNOSIS — R000 Tachycardia, unspecified: Secondary | ICD-10-CM | POA: Insufficient documentation

## 2014-01-01 DIAGNOSIS — M129 Arthropathy, unspecified: Secondary | ICD-10-CM | POA: Diagnosis not present

## 2014-01-01 DIAGNOSIS — F419 Anxiety disorder, unspecified: Secondary | ICD-10-CM

## 2014-01-01 DIAGNOSIS — F3289 Other specified depressive episodes: Secondary | ICD-10-CM | POA: Diagnosis not present

## 2014-01-01 DIAGNOSIS — E119 Type 2 diabetes mellitus without complications: Secondary | ICD-10-CM | POA: Insufficient documentation

## 2014-01-01 DIAGNOSIS — Z79899 Other long term (current) drug therapy: Secondary | ICD-10-CM | POA: Insufficient documentation

## 2014-01-01 LAB — CBC WITH DIFFERENTIAL/PLATELET
Basophils Absolute: 0 10*3/uL (ref 0.0–0.1)
Basophils Relative: 0 % (ref 0–1)
Eosinophils Absolute: 0.1 10*3/uL (ref 0.0–0.7)
Eosinophils Relative: 1 % (ref 0–5)
HEMATOCRIT: 35.3 % — AB (ref 36.0–46.0)
Hemoglobin: 12.3 g/dL (ref 12.0–15.0)
LYMPHS ABS: 2.1 10*3/uL (ref 0.7–4.0)
LYMPHS PCT: 19 % (ref 12–46)
MCH: 27.6 pg (ref 26.0–34.0)
MCHC: 34.8 g/dL (ref 30.0–36.0)
MCV: 79.1 fL (ref 78.0–100.0)
MONOS PCT: 12 % (ref 3–12)
Monocytes Absolute: 1.3 10*3/uL — ABNORMAL HIGH (ref 0.1–1.0)
NEUTROS ABS: 7.4 10*3/uL (ref 1.7–7.7)
NEUTROS PCT: 68 % (ref 43–77)
Platelets: 362 10*3/uL (ref 150–400)
RBC: 4.46 MIL/uL (ref 3.87–5.11)
RDW: 13.3 % (ref 11.5–15.5)
WBC: 10.9 10*3/uL — AB (ref 4.0–10.5)

## 2014-01-01 LAB — COMPREHENSIVE METABOLIC PANEL
ALT: 34 U/L (ref 0–35)
ANION GAP: 16 — AB (ref 5–15)
AST: 28 U/L (ref 0–37)
Albumin: 3.8 g/dL (ref 3.5–5.2)
Alkaline Phosphatase: 105 U/L (ref 39–117)
BUN: 11 mg/dL (ref 6–23)
CHLORIDE: 95 meq/L — AB (ref 96–112)
CO2: 25 meq/L (ref 19–32)
CREATININE: 0.61 mg/dL (ref 0.50–1.10)
Calcium: 9.5 mg/dL (ref 8.4–10.5)
GFR calc Af Amer: >90 mL/min (ref >90)
Glucose, Bld: 120 mg/dL — ABNORMAL HIGH (ref 70–99)
POTASSIUM: 4 meq/L (ref 3.7–5.3)
Sodium: 136 mEq/L — ABNORMAL LOW (ref 137–147)
Total Bilirubin: 0.5 mg/dL (ref 0.3–1.2)
Total Protein: 8.8 g/dL — ABNORMAL HIGH (ref 6.0–8.3)

## 2014-01-01 MED ORDER — SODIUM CHLORIDE 0.9 % IV BOLUS (SEPSIS)
1000.0000 mL | Freq: Once | INTRAVENOUS | Status: AC
  Administered 2014-01-01: 1000 mL via INTRAVENOUS

## 2014-01-01 MED ORDER — ALPRAZOLAM 0.25 MG PO TABS: 0.2500 mg | ORAL_TABLET | Freq: Every evening | ORAL | Status: DC | PRN

## 2014-01-01 NOTE — Telephone Encounter (Signed)
Called in stating she is feeling a lot better with her anxiety than yesterday. Still feels a little on edge but her man concern is her heart rate is staying around 120 130. Can't sit still. BP has been around 102 systolic. Wants to know if she can have something to lower her heart rate. Coming in Thursday.

## 2014-01-01 NOTE — Telephone Encounter (Signed)
With heart rate like that needs cardiology eval, since bp is  repoeted so low unsafe to add beta blocker without eval, needs to go to ED today, , may change her to appt tomorrow. Send xanax 0.25 mg one tab once daily as needed, for anxiety #10 only pls Let her take one today see if this helps with the fast heart rate/anxiety

## 2014-01-01 NOTE — ED Provider Notes (Signed)
CSN: [id number]     Arrival date & time 01/01/14  2047 History  This chart was scribed for Benny Lennert, MD by Milly Jakob, ED Scribe. The patient was seen in room APA01/APA01. Patient's care was started at 9:08 PM.    Chief Complaint  Patient presents with  . Tachycardia   Patient is a 38 y.o. female presenting with palpitations. The history is provided by the patient. No language interpreter was used.  Palpitations Palpitations quality:  Fast Onset quality:  Sudden Duration:  5 days Timing:  Sporadic Progression:  Unchanged Chronicity:  New Context: anxiety   Relieved by:  Nothing Worsened by:  Nothing tried Ineffective treatments:  Bed rest Associated symptoms: no back pain, no chest pain and no cough   Risk factors: diabetes mellitus    HPI Comments: [name] Fernandez is a 38 y.o. female with a history of HTN who presents to the Emergency Department complaining of palpitations and anxiety after having a cholecystectomy performed 5 days ago. She reports difficulty eating, drinking, and sleeping. She reports that her doctor increased her Buspar. She also reports a recent prescription of Paxil, and Xanax to help her sleep. She reports that she has difficulty sleeping, because her palpitations wake her up.  Past Medical History  Diagnosis Date  . Hyperlipidemia   . Hypertension   . Diabetes mellitus   . Depression   . Anxiety   . GERD (gastroesophageal reflux disease)   . Headache(784.0)   . Arthritis    Past Surgical History  Procedure Laterality Date  . Breast surgery      right lumpectomy, benign  . Cholecystectomy N/A 12/27/2013    Procedure: LAPAROSCOPIC CHOLECYSTECTOMY ;  Surgeon: Marlane Hatcher, MD;  Location: [facility];  Service: General;  Laterality: N/A;   History reviewed. No pertinent family history. History  Substance Use Topics  . Smoking status: Never Smoker   . Smokeless tobacco: Not on file  . Alcohol Use: Yes     Comment: occ.   OB History   Grav Para Term Preterm Abortions TAB SAB Ect Mult Living                 Review of Systems  Constitutional: Negative for appetite change and fatigue.  HENT: Negative for congestion, ear discharge and sinus pressure.   Eyes: Negative for discharge.  Respiratory: Negative for cough.   Cardiovascular: Positive for palpitations. Negative for chest pain.  Gastrointestinal: Negative for abdominal pain and diarrhea.  Genitourinary: Negative for frequency and hematuria.  Musculoskeletal: Negative for back pain.  Skin: Negative for rash.  Neurological: Negative for seizures and headaches.  Psychiatric/Behavioral: Negative for hallucinations. The patient is nervous/anxious.    Allergies  Janumet  Home Medications   Prior to Admission medications   Medication Sig Start Date End Date Taking? Authorizing Provider  ALPRAZolam (XANAX) 0.25 MG tablet Take 1 tablet (0.25 mg total) by mouth at bedtime as needed for anxiety. 01/01/14   Kerri Perches, MD  busPIRone (BUSPAR) 5 MG tablet Take 1 tablet (5 mg total) by mouth 2 (two) times daily. 12/04/13   Kerri Perches, MD  Canagliflozin (INVOKANA) 300 MG TABS Take 1 tablet (300 mg total) by mouth daily. 02/02/13 02/02/14  Kerri Perches, MD  Cholecalciferol (VITAMIN D3) 50000 UNITS CAPS Take 50,000 Units by mouth once a week. 06/18/11   Kerri Perches, MD  glipiZIDE (GLUCOTROL XL) 10 MG 24 hr tablet Take 2 tablets (20 mg total) by  mouth daily. 12/11/13   Kerri Perches, MD  lisinopril (PRINIVIL,ZESTRIL) 20 MG tablet Take 1 tablet (20 mg total) by mouth daily. 12/04/13 12/04/14  Kerri Perches, MD  oxyCODONE-acetaminophen (PERCOCET) 10-325 MG per tablet Take 1 tablet by mouth every 4 (four) hours as needed for pain. 12/29/13   Marlane Hatcher, MD  PARoxetine (PAXIL) 10 MG tablet Take 1 tablet (10 mg total) by mouth daily. 12/31/13   Kerri Perches, MD  pravastatin (PRAVACHOL) 40 MG tablet Take 1 tablet (40 mg total) by mouth every  evening. 02/02/13 02/02/14  Kerri Perches, MD   Triage Vitals: BP 151/86  Pulse 90  Temp(Src) 98.7 F (37.1 C) (Oral)  Resp 20  Ht 5' (1.524 m)  Wt 121 lb (54.885 kg)  BMI 23.63 kg/m2  SpO2 99%  LMP 12/17/2013 Physical Exam  Nursing note and vitals reviewed. Constitutional: She appears well-developed and well-nourished.  HENT:  Head: Normocephalic and atraumatic.  Eyes: Conjunctivae are normal.  Neck: Normal range of motion.  Cardiovascular: Normal rate, regular rhythm, normal heart sounds and intact distal pulses.   Pulmonary/Chest: Effort normal and breath sounds normal. She has no wheezes.  Abdominal: Soft. Bowel sounds are normal. There is no tenderness.  Musculoskeletal: Normal range of motion.  Neurological: She is alert.  Skin: Skin is warm and dry.  Psychiatric: Her mood appears anxious.    ED Course  Procedures (including critical care time) DIAGNOSTIC STUDIES: Oxygen Saturation is 99% on room air, normal by my interpretation.    COORDINATION OF CARE: 9:11 PM-Discussed treatment plan with pt at bedside and pt agreed to plan.   Labs Review Labs Reviewed  CBC WITH DIFFERENTIAL  COMPREHENSIVE METABOLIC PANEL    Imaging Review No results found.   EKG Interpretation   Date/Time:  Tuesday January 01 2014 21:01:45 EDT Ventricular Rate:  89 PR Interval:  135 QRS Duration: 88 QT Interval:  352 QTC Calculation: 428 R Axis:   48 Text Interpretation:  Sinus rhythm Confirmed by Park Beck  MD, Clinton Dragone ([id number])  on 01/02/2014 12:16:51 AM      MDM   Final diagnoses:  None    Anxiety,  Improved  The chart was scribed for me under my direct supervision.  I personally performed the history, physical, and medical decision making and all procedures in the evaluation of this patient.Benny Lennert, MD 01/02/14 [phone]

## 2014-01-01 NOTE — Telephone Encounter (Signed)
Xanax sent in. Will go to ER if xanax doesn't help and call for appt tomorrow

## 2014-01-01 NOTE — ED Notes (Signed)
Pt recently d/c'd due to cholecystectomy, c/o feeling anxious and tachy HR at home, states HR down to 105, c/o back pain and unable to eat

## 2014-01-02 ENCOUNTER — Encounter: Payer: Self-pay | Admitting: Family Medicine

## 2014-01-02 ENCOUNTER — Ambulatory Visit (INDEPENDENT_AMBULATORY_CARE_PROVIDER_SITE_OTHER): Payer: 59 | Admitting: Family Medicine

## 2014-01-02 VITALS — BP 122/80 | HR 86 | Resp 18 | Wt 121.0 lb

## 2014-01-02 DIAGNOSIS — IMO0002 Reserved for concepts with insufficient information to code with codable children: Secondary | ICD-10-CM

## 2014-01-02 DIAGNOSIS — F411 Generalized anxiety disorder: Secondary | ICD-10-CM

## 2014-01-02 DIAGNOSIS — I10 Essential (primary) hypertension: Secondary | ICD-10-CM

## 2014-01-02 DIAGNOSIS — E1165 Type 2 diabetes mellitus with hyperglycemia: Secondary | ICD-10-CM

## 2014-01-02 DIAGNOSIS — IMO0001 Reserved for inherently not codable concepts without codable children: Secondary | ICD-10-CM

## 2014-01-02 NOTE — Patient Instructions (Signed)
F/u as before  PLease  Take the buspar on a regular schedule , if twice daily then at 7am  And 7 pm, after next 2 to 3 daysyou tolerate it well, then inc to 7am, 2pm and 10 to 11 at night apaxil one tablet every day same time at bedtime  You are referred to psychiatrty as soon as possible

## 2014-01-02 NOTE — Discharge Instructions (Signed)
Follow up with your md as needed °

## 2014-01-03 ENCOUNTER — Ambulatory Visit: Payer: 59 | Admitting: Family Medicine

## 2014-01-06 NOTE — Assessment & Plan Note (Signed)
rpeorts improved blood sugars , though not taking med as prescribed, recently had cholecystectomy and oral intak is restricted

## 2014-01-06 NOTE — Progress Notes (Signed)
   Subjective:    Patient ID: [name] Fernandez, female    DOB: 08/22/1975, 38 y.o.   MRN: [id number]  HPI Pt in due to severe uncontrolled anxiety  Symptoms, she was actually seen in the Ed yesterday as she was experiencing severe palpaitions, no additional med started in the ED She states that being home confined due to recent surgery is "working against  Her" and is anxious to return to work I advise her of the need to get her mental health treated so that she can finally start taking care of herself, improve her health and overall level of function Had cholecystectomy recently and is recovering well, reduced oral intake , improved blood sugars noted despite the fact that she is not actualyl taking the med as prescribed   Review of Systems See HPI Denies recent fever or chills. Denies sinus pressure, nasal congestion, ear pain or sore throat. Denies chest congestion, productive cough or wheezing.    Denies dysuria, frequency, hesitancy or incontinence. Denies joint pain, swelling and limitation in mobility. Denies headaches, seizures, numbness, or tingling.  Denies skin break down or rash.        Objective:   Physical Exam BP 122/80  Pulse 86  Resp 18  Wt 121 lb (54.885 kg)  SpO2 99%  LMP 12/17/2013 Patient alert and oriented and in no cardiopulmonary distress.  HEENT: No facial asymmetry, EOMI,   oropharynx pink and moist.  Neck supple no JVD, no mass.  Chest: Clear to auscultation bilaterally.  CVS: S1, S2 no murmurs, no S3.Regular rate.  ABD: Soft non tender.   Ext: No edema  .  Psych: Good eye contact, extremely anxious, not tearful or depressed appearing  CNS: CN 2-12 intact, power,  normal throughout.no focal deficits noted.        Assessment & Plan:  Generalized anxiety disorder Uncontrollled, pt has not been taking medication as  Prescribed on a scheduled basis, she is in specifiacaly to be educated about this and is receptive to info provided. She  is also referred to psych as her symptoms are extremely severe, and she has had this dx form years , but hs not been receptive to treatment upo until the present time. Reports an excessively stressful past 12 months due to several  illnesses and death in her immediate family  DM (diabetes mellitus), type 2, uncontrolled rpeorts improved blood sugars , though not taking med as prescribed, recently had cholecystectomy and oral intak is restricted  HYPERTENSION Controlled, no change in medication

## 2014-01-06 NOTE — Assessment & Plan Note (Signed)
Uncontrollled, pt has not been taking medication as  Prescribed on a scheduled basis, she is in specifiacaly to be educated about this and is receptive to info provided. She is also referred to psych as her symptoms are extremely severe, and she has had this dx form years , but hs not been receptive to treatment upo until the present time. Reports an excessively stressful past 12 months due to several  illnesses and death in her immediate family

## 2014-01-06 NOTE — Assessment & Plan Note (Signed)
Controlled, no change in medication  

## 2014-01-15 ENCOUNTER — Telehealth: Payer: Self-pay

## 2014-01-15 NOTE — Telephone Encounter (Addendum)
I would suggest she stop the paxil if unable to tolerate, buspar is excellent for generalized anxiety so just take that one  as prescribed.  She should also have an appt for psychiatry, ask about that , I do not see any for this office , needs one for Sept 29, or 30

## 2014-01-15 NOTE — Telephone Encounter (Signed)
Patient aware.

## 2014-01-23 ENCOUNTER — Telehealth: Payer: Self-pay

## 2014-01-23 DIAGNOSIS — Z711 Person with feared health complaint in whom no diagnosis is made: Secondary | ICD-10-CM | POA: Insufficient documentation

## 2014-01-23 NOTE — Telephone Encounter (Signed)
Is this ok?

## 2014-01-23 NOTE — Telephone Encounter (Signed)
I think this should be from the surgeon, I did not take her out for a prolonged time for surgery. Her fMLA for from here was for chronic conditions and I think the extended time for surgery was put ion. I will need Dr Daisy Blossom nnote of release before I would sign , and I really believe it is his responsibility , her surgery is why she has been out for this length of time Pls correct me if I am wrong

## 2014-01-23 NOTE — Assessment & Plan Note (Signed)
Uncontrolled, sever and disabling , pt now recognizes the need for , and is ready to get help with medicaton for her symptoms so that she is able to function. Recent multiple losses and illnesses in her family have triggered her decompensation

## 2014-01-23 NOTE — Assessment & Plan Note (Addendum)
Uncontrolled .Hyperlipidemia:Low fat diet discussed and encouraged. Start statin therapy

## 2014-01-23 NOTE — Assessment & Plan Note (Signed)
[place] ordered years ago to eval for cholelithiasis, did not follow through intends to have current study ordered due to symptom severity

## 2014-01-23 NOTE — Assessment & Plan Note (Signed)
Uncontrolled, non compliant with medication same is prescribed and importance of talking daily as prescribed is stressed DASH diet and commitment to daily physical activity for a minimum of 30 minutes discussed and encouraged, as a part of hypertension management. The importance of attaining a healthy weight is also discussed.

## 2014-01-23 NOTE — Assessment & Plan Note (Signed)
Uncontrolled due to medical non compliance, neither takes medications, tests blood sugars or keps follow up a[ppointments Patient advised to reduce carb and sweets, commit to regular physical activity, take meds as prescribed, test blood as directed, and attempt to lose weight, to improve blood sugar control.

## 2014-01-24 NOTE — Telephone Encounter (Signed)
Patient is aware that she should contact surgeon for further advice on when to return to work.

## 2014-02-06 ENCOUNTER — Encounter: Payer: Self-pay | Admitting: Family Medicine

## 2014-02-06 ENCOUNTER — Encounter (HOSPITAL_COMMUNITY): Payer: Self-pay | Admitting: Psychiatry

## 2014-02-06 ENCOUNTER — Ambulatory Visit (INDEPENDENT_AMBULATORY_CARE_PROVIDER_SITE_OTHER): Payer: 59 | Admitting: Family Medicine

## 2014-02-06 ENCOUNTER — Ambulatory Visit (INDEPENDENT_AMBULATORY_CARE_PROVIDER_SITE_OTHER): Payer: 59 | Admitting: Psychiatry

## 2014-02-06 VITALS — BP 125/86 | HR 78 | Ht 60.0 in | Wt 127.6 lb

## 2014-02-06 VITALS — BP 148/86 | HR 91 | Resp 16 | Ht 60.0 in | Wt 125.1 lb

## 2014-02-06 DIAGNOSIS — IMO0001 Reserved for inherently not codable concepts without codable children: Secondary | ICD-10-CM

## 2014-02-06 DIAGNOSIS — I10 Essential (primary) hypertension: Secondary | ICD-10-CM

## 2014-02-06 DIAGNOSIS — Z23 Encounter for immunization: Secondary | ICD-10-CM

## 2014-02-06 DIAGNOSIS — E785 Hyperlipidemia, unspecified: Secondary | ICD-10-CM

## 2014-02-06 DIAGNOSIS — IMO0002 Reserved for concepts with insufficient information to code with codable children: Secondary | ICD-10-CM

## 2014-02-06 DIAGNOSIS — E1165 Type 2 diabetes mellitus with hyperglycemia: Secondary | ICD-10-CM

## 2014-02-06 DIAGNOSIS — R768 Other specified abnormal immunological findings in serum: Secondary | ICD-10-CM

## 2014-02-06 DIAGNOSIS — F411 Generalized anxiety disorder: Secondary | ICD-10-CM

## 2014-02-06 DIAGNOSIS — E559 Vitamin D deficiency, unspecified: Secondary | ICD-10-CM

## 2014-02-06 DIAGNOSIS — R894 Abnormal immunological findings in specimens from other organs, systems and tissues: Secondary | ICD-10-CM
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
## 2014-02-06 NOTE — Patient Instructions (Addendum)
CPE with pap in first week in November  Nurse BP check in 2 weeks  Fasting lipid, CMP and EGFR , hBA1C and Vit D Oct 30/ 31, and vit D   Please commit to taking your blood pressure same time every evening with your cholesterol pill, your bP is good today and you took the pill today  No script sent for Vit D till we repeat test, in 2014 , this was normal, OK to continue your OTC vit D  Foot exam today is good  Flu shot and microalb today

## 2014-02-08 LAB — MICROALBUMIN / CREATININE URINE RATIO
Creatinine, Urine: 43.6 mg/dL
MICROALB UR: 1.6 mg/dL (ref <2.0)
Microalb Creat Ratio: 36.7 mg/g — ABNORMAL HIGH (ref 0.0–30.0)

## 2014-02-09 DIAGNOSIS — Z23 Encounter for immunization: Secondary | ICD-10-CM | POA: Insufficient documentation

## 2014-02-09 NOTE — Assessment & Plan Note (Signed)
Pt to take entire antibiotic course to treat the infection, still needs to do this

## 2014-02-09 NOTE — Assessment & Plan Note (Signed)
Patient educated about the importance of limiting  Carbohydrate intake , the need to commit to daily physical activity for a minimum of 30 minutes , and to commit weight loss. The fact that changes in all these areas will reduce or eliminate all together the development of diabetes is stressed.   Updated lab needed at/ before next visit.  

## 2014-02-09 NOTE — Assessment & Plan Note (Signed)
Uncontrolled, non compliant with medication Re educated re importance of same DASH diet and commitment to daily physical activity for a minimum of 30 minutes discussed and encouraged, as a part of hypertension management. The importance of attaining a healthy weight is also discussed. F/u bP recheck in 2 weeks

## 2014-02-09 NOTE — Assessment & Plan Note (Signed)
Improved to some extent , will start therapy locally. Ha seen psychiatry, no med change and referred to therapist

## 2014-02-09 NOTE — Assessment & Plan Note (Signed)
Hyperlipidemia:Low fat diet discussed and encouraged. Med compliance stressed, currently non compliant Updated lab needed at/ before next visit.

## 2014-02-09 NOTE — Assessment & Plan Note (Signed)
Vaccine administered at visit.  

## 2014-02-09 NOTE — Progress Notes (Signed)
   Subjective:    Patient ID: [name] Fernandez, female    DOB: 06/06/1975, 38 y.o.   MRN: [id number]  HPI The PT is here for follow up and re-evaluation of chronic medical conditions, medication management and review of any available recent lab and radiology data.  Preventive health is updated, specifically  Cancer screening and Immunization.   Questions or concerns regarding consultations or procedures which the PT has had in the interim are  Addressed.Has seen psychiatry and will follow with therapist The PT denies any adverse reactions to current medications since the last visit. Still not taking blood pressure or cholesterol meds reguarly states she will do so, has still to take H pylori meds There are no new concerns.  There are no specific complaints       Review of Systems See HPI Denies recent fever or chills. Denies sinus pressure, nasal congestion, ear pain or sore throat. Denies chest congestion, productive cough or wheezing. Denies chest pains, palpitations and leg swelling Denies abdominal pain, nausea, vomiting,diarrhea or constipation.   Denies dysuria, frequency, hesitancy or incontinence. Denies joint pain, swelling and limitation in mobility. Denies headaches, seizures, numbness, or tingling. Denies depression, reports n markedly improved  anxiety and denies  insomnia. Denies skin break down or rash.        Objective:   Physical Exam BP 148/86  Pulse 91  Resp 16  Ht 5' (1.524 m)  Wt 125 lb 1.9 oz (56.754 kg)  BMI 24.44 kg/m2  SpO2 98% Patient alert and oriented and in no cardiopulmonary distress.  HEENT: No facial asymmetry, EOMI,   oropharynx pink and moist.  Neck supple no JVD, no mass.  Chest: Clear to auscultation bilaterally.  CVS: S1, S2 no murmurs, no S3.Regular rate.  ABD: Soft non tender.   Ext: No edema  MS: Adequate ROM spine, shoulders, hips and knees.  Skin: Intact, no ulcerations or rash noted.  Psych: Good eye contact, normal  affect. Memory intact mildly anxious not  depressed appearing.  CNS: CN 2-12 intact, power,  normal throughout.no focal deficits noted.        Assessment & Plan:  Essential hypertension Uncontrolled, non compliant with medication Re educated re importance of same DASH diet and commitment to daily physical activity for a minimum of 30 minutes discussed and encouraged, as a part of hypertension management. The importance of attaining a healthy weight is also discussed. F/u bP recheck in 2 weeks  Generalized anxiety disorder Improved to some extent , will start therapy locally. Ha seen psychiatry, no med change and referred to therapist  DM (diabetes mellitus), type 2, uncontrolled Patient educated about the importance of limiting  Carbohydrate intake , the need to commit to daily physical activity for a minimum of 30 minutes , and to commit weight loss. The fact that changes in all these areas will reduce or eliminate all together the development of diabetes is stressed.   Updated lab needed at/ before next visit.   Hyperlipidemia LDL goal <100 Hyperlipidemia:Low fat diet discussed and encouraged. Med compliance stressed, currently non compliant Updated lab needed at/ before next visit.     Helicobacter pylori antibody positive Pt to take entire antibiotic course to treat the infection, still needs to do this  Need for prophylactic vaccination and inoculation against influenza Vaccine administered at visit.

## 2014-03-06 ENCOUNTER — Ambulatory Visit (HOSPITAL_COMMUNITY): Payer: Self-pay | Admitting: Psychiatry

## 2014-04-23 ENCOUNTER — Other Ambulatory Visit: Payer: Self-pay

## 2014-04-23 MED ORDER — PRAVASTATIN SODIUM 40 MG PO TABS: 40.0000 mg | ORAL_TABLET | Freq: Every evening | ORAL | Status: DC

## 2014-05-07 ENCOUNTER — Other Ambulatory Visit: Payer: Self-pay

## 2014-05-07 MED ORDER — GLIPIZIDE ER 10 MG PO TB24: 20.0000 mg | ORAL_TABLET | Freq: Every day | ORAL | Status: DC

## 2014-05-07 MED ORDER — CANAGLIFLOZIN 300 MG PO TABS: 300.0000 mg | ORAL_TABLET | Freq: Every day | ORAL | Status: DC

## 2014-05-21 ENCOUNTER — Ambulatory Visit: Payer: 59 | Admitting: Family Medicine

## 2014-05-21 LAB — COMPLETE METABOLIC PANEL WITH GFR
ALK PHOS: 59 U/L (ref 39–117)
ALT: 21 U/L (ref 0–35)
AST: 21 U/L (ref 0–37)
Albumin: 4.4 g/dL (ref 3.5–5.2)
BUN: 16 mg/dL (ref 6–23)
CO2: 29 mEq/L (ref 19–32)
Calcium: 9.6 mg/dL (ref 8.4–10.5)
Chloride: 100 mEq/L (ref 96–112)
Creat: 0.65 mg/dL (ref 0.50–1.10)
GFR, Est African American: >89 mL/min
GFR, Est Non African American: >89 mL/min
Glucose, Bld: 140 mg/dL — ABNORMAL HIGH (ref 70–99)
POTASSIUM: 4.2 meq/L (ref 3.5–5.3)
SODIUM: 139 meq/L (ref 135–145)
Total Bilirubin: 0.5 mg/dL (ref 0.2–1.2)
Total Protein: 7.8 g/dL (ref 6.0–8.3)

## 2014-05-21 LAB — HEMOGLOBIN A1C
Hgb A1c MFr Bld: 8.4 % — ABNORMAL HIGH (ref <5.7)
Mean Plasma Glucose: 194 mg/dL — ABNORMAL HIGH (ref <117)

## 2014-05-21 LAB — LIPID PANEL
Cholesterol: 286 mg/dL — ABNORMAL HIGH (ref 0–200)
HDL: 37 mg/dL — AB (ref >39)
LDL Cholesterol: 212 mg/dL — ABNORMAL HIGH (ref 0–99)
TRIGLYCERIDES: 183 mg/dL — AB (ref <150)
Total CHOL/HDL Ratio: 7.7 Ratio
VLDL: 37 mg/dL (ref 0–40)

## 2014-05-21 LAB — VITAMIN D 25 HYDROXY (VIT D DEFICIENCY, FRACTURES): Vit D, 25-Hydroxy: 30 ng/mL (ref 30–100)

## 2014-05-23 ENCOUNTER — Ambulatory Visit (INDEPENDENT_AMBULATORY_CARE_PROVIDER_SITE_OTHER): Payer: 59 | Admitting: Family Medicine

## 2014-05-23 ENCOUNTER — Encounter: Payer: Self-pay | Admitting: Family Medicine

## 2014-05-23 VITALS — BP 122/84 | HR 90 | Resp 18 | Ht 60.0 in | Wt 134.1 lb

## 2014-05-23 DIAGNOSIS — E1165 Type 2 diabetes mellitus with hyperglycemia: Secondary | ICD-10-CM

## 2014-05-23 DIAGNOSIS — F411 Generalized anxiety disorder: Secondary | ICD-10-CM

## 2014-05-23 DIAGNOSIS — IMO0002 Reserved for concepts with insufficient information to code with codable children: Secondary | ICD-10-CM

## 2014-05-23 DIAGNOSIS — I10 Essential (primary) hypertension: Secondary | ICD-10-CM

## 2014-05-23 DIAGNOSIS — E785 Hyperlipidemia, unspecified: Secondary | ICD-10-CM

## 2014-05-23 MED ORDER — SITAGLIPTIN PHOSPHATE 100 MG PO TABS: 100.0000 mg | ORAL_TABLET | Freq: Every day | ORAL | Status: DC

## 2014-05-23 MED ORDER — ROSUVASTATIN CALCIUM 40 MG PO TABS: 40.0000 mg | ORAL_TABLET | Freq: Every day | ORAL | Status: DC

## 2014-05-23 NOTE — Assessment & Plan Note (Signed)
Improved but not at goal. Patient educated about the importance of limiting  Carbohydrate intake , the need to commit to daily physical activity for a minimum of 30 minutes , and to commit weight loss. The fact that changes in all these areas will reduce or eliminate all together the development of diabetes is stressed.   Add januvia Pt instructed to call in with concerns about her blood sugar levels Updated lab needed at/ before next visit.

## 2014-05-23 NOTE — Assessment & Plan Note (Signed)
Improved and controlled, continue current management

## 2014-05-23 NOTE — Assessment & Plan Note (Signed)
Deteriorated, reports eating a lot of fast food States she forget her med sometimes Change to crestor Hyperlipidemia:Low fat diet discussed and encouraged.  Updated lab needed at/ before next visit.

## 2014-05-23 NOTE — Patient Instructions (Signed)
F/u as before  CONGRATS on improved blood sugar, need to get to 7   Change diet please, fats are extremely high,, remember take cholesterol med EVERY pM  New additional med for diabetes is [place]   Goal for fasting blood sugar ranges from 80 to 120 and 2 hours after any meal or at bedtime should be between 130 to 170.  Arrange with nurse to call weekly ion Friday pm if blood sugars are too high

## 2014-05-23 NOTE — Assessment & Plan Note (Signed)
Controlled, no change in medication DASH diet and commitment to daily physical activity for a minimum of 30 minutes discussed and encouraged, as a part of hypertension management. The importance of attaining a healthy weight is also discussed.  

## 2014-05-23 NOTE — Assessment & Plan Note (Signed)
Improved and well controlled, continue current med

## 2014-05-23 NOTE — Progress Notes (Signed)
   Subjective:    Patient ID: [name] Fernandez, female    DOB: 06/08/1975, 39 y.o.   MRN: [id number]  HPI The PT is here for follow up and re-evaluation of chronic medical conditions, medication management and review of any available recent lab and radiology data.  Preventive health is updated, specifically  Cancer screening and Immunization.    The PT denies any adverse reactions to current medications since the last visit.  There are no new concerns.  There are no specific complaints , feels better, but is aware of the need to stop eating out and eat more home cooked meals      Review of Systems See HPI Denies recent fever or chills. Denies sinus pressure, nasal congestion, ear pain or sore throat. Denies chest congestion, productive cough or wheezing. Denies chest pains, palpitations and leg swelling Denies abdominal pain, nausea, vomiting,diarrhea or constipation.   Denies dysuria, frequency, hesitancy or incontinence. Denies joint pain, swelling and limitation in mobility. Denies headaches, seizures, numbness, or tingling. Denies depression, anxiety or insomnia. Denies skin break down or rash.        Objective:   Physical Exam . Vs Patient alert and oriented and in no cardiopulmonary distress.  HEENT: No facial asymmetry, EOMI,   oropharynx pink and moist.  Neck supple no JVD, no mass.  Chest: Clear to auscultation bilaterally.  CVS: S1, S2 no murmurs, no S3.Regular rate.  ABD: Soft non tender.   Ext: No edema  MS: Adequate ROM spine, shoulders, hips and knees.  Skin: Intact, no ulcerations or rash noted.  Psych: Good eye contact, normal affect. Memory intact not anxious or depressed appearing.  CNS: CN 2-12 intact, power,  normal throughout.no focal deficits noted.        Assessment & Plan:  Essential hypertension Controlled, no change in medication DASH diet and commitment to daily physical activity for a minimum of 30 minutes discussed and  encouraged, as a part of hypertension management. The importance of attaining a healthy weight is also discussed.    GAD (generalized anxiety disorder) Improved and controlled, continue current management   Generalized anxiety disorder Improved and well controlled, continue current med   Hyperlipidemia LDL goal <100 Deteriorated, reports eating a lot of fast food States she forget her med sometimes Change to crestor Hyperlipidemia:Low fat diet discussed and encouraged.  Updated lab needed at/ before next visit.    DM (diabetes mellitus), type 2, uncontrolled Improved but not at goal. Patient educated about the importance of limiting  Carbohydrate intake , the need to commit to daily physical activity for a minimum of 30 minutes , and to commit weight loss. The fact that changes in all these areas will reduce or eliminate all together the development of diabetes is stressed.   Add januvia Pt instructed to call in with concerns about her blood sugar levels Updated lab needed at/ before next visit.

## 2014-07-08 ENCOUNTER — Other Ambulatory Visit: Payer: Self-pay

## 2014-07-08 DIAGNOSIS — I10 Essential (primary) hypertension: Secondary | ICD-10-CM

## 2014-07-08 MED ORDER — LISINOPRIL 20 MG PO TABS: 20.0000 mg | ORAL_TABLET | Freq: Every day | ORAL | Status: DC

## 2014-08-28 ENCOUNTER — Encounter: Payer: Self-pay | Admitting: Family Medicine

## 2014-08-28 ENCOUNTER — Other Ambulatory Visit (HOSPITAL_COMMUNITY)
Admission: RE | Admit: 2014-08-28 | Discharge: 2014-08-28 | Disposition: A | Discharge status: Home / Self Care | Payer: 59 | Source: Ambulatory Visit | Attending: Family Medicine | Admitting: Family Medicine

## 2014-08-28 ENCOUNTER — Ambulatory Visit (INDEPENDENT_AMBULATORY_CARE_PROVIDER_SITE_OTHER): Payer: 59 | Admitting: Family Medicine

## 2014-08-28 VITALS — BP 124/72 | HR 76 | Resp 18 | Ht 60.0 in | Wt 128.1 lb

## 2014-08-28 DIAGNOSIS — Z01419 Encounter for gynecological examination (general) (routine) without abnormal findings: Secondary | ICD-10-CM | POA: Diagnosis present

## 2014-08-28 DIAGNOSIS — Z124 Encounter for screening for malignant neoplasm of cervix: Secondary | ICD-10-CM

## 2014-08-28 DIAGNOSIS — Z1151 Encounter for screening for human papillomavirus (HPV): Secondary | ICD-10-CM | POA: Diagnosis present

## 2014-08-28 DIAGNOSIS — Z0000 Encounter for general adult medical examination without abnormal findings: Secondary | ICD-10-CM | POA: Diagnosis not present

## 2014-08-28 DIAGNOSIS — E1165 Type 2 diabetes mellitus with hyperglycemia: Secondary | ICD-10-CM

## 2014-08-28 DIAGNOSIS — E785 Hyperlipidemia, unspecified: Secondary | ICD-10-CM | POA: Diagnosis not present

## 2014-08-28 DIAGNOSIS — IMO0002 Reserved for concepts with insufficient information to code with codable children: Secondary | ICD-10-CM

## 2014-08-28 LAB — HEPATIC FUNCTION PANEL
ALK PHOS: 50 U/L (ref 39–117)
ALT: 11 U/L (ref 0–35)
AST: 15 U/L (ref 0–37)
Albumin: 4.1 g/dL (ref 3.5–5.2)
BILIRUBIN DIRECT: 0.1 mg/dL (ref 0.0–0.3)
Indirect Bilirubin: 0.2 mg/dL (ref 0.2–1.2)
Total Bilirubin: 0.3 mg/dL (ref 0.2–1.2)
Total Protein: 7.2 g/dL (ref 6.0–8.3)

## 2014-08-28 LAB — COMPLETE METABOLIC PANEL WITH GFR
ALBUMIN: 4.1 g/dL (ref 3.5–5.2)
ALT: 11 U/L (ref 0–35)
AST: 15 U/L (ref 0–37)
Alkaline Phosphatase: 50 U/L (ref 39–117)
BUN: 11 mg/dL (ref 6–23)
CALCIUM: 9.1 mg/dL (ref 8.4–10.5)
CHLORIDE: 104 meq/L (ref 96–112)
CO2: 29 mEq/L (ref 19–32)
Creat: 0.59 mg/dL (ref 0.50–1.10)
GFR, Est African American: >89 mL/min
GFR, Est Non African American: >89 mL/min
Glucose, Bld: 80 mg/dL (ref 70–99)
POTASSIUM: 4.3 meq/L (ref 3.5–5.3)
SODIUM: 139 meq/L (ref 135–145)
TOTAL PROTEIN: 7.2 g/dL (ref 6.0–8.3)
Total Bilirubin: 0.3 mg/dL (ref 0.2–1.2)

## 2014-08-28 LAB — LIPID PANEL
Cholesterol: 151 mg/dL (ref 0–200)
HDL: 33 mg/dL — AB (ref >=46)
LDL CALC: 101 mg/dL — AB (ref 0–99)
Total CHOL/HDL Ratio: 4.6 Ratio
Triglycerides: 84 mg/dL (ref <150)
VLDL: 17 mg/dL (ref 0–40)

## 2014-08-28 LAB — HEMOGLOBIN A1C
Hgb A1c MFr Bld: 7.6 % — ABNORMAL HIGH (ref <5.7)
Mean Plasma Glucose: 171 mg/dL — ABNORMAL HIGH (ref <117)

## 2014-08-28 MED ORDER — INSULIN GLARGINE 100 UNIT/ML SOLOSTAR PEN: 15.0000 [IU] | PEN_INJECTOR | Freq: Every day | SUBCUTANEOUS | Status: DC

## 2014-08-28 NOTE — Assessment & Plan Note (Signed)
Improved , but not adequately controlled Start lantus 5 units daily, will call in weekly to titrate as needed, script written for 15 units Refer to diabetic ed, and also [facility]

## 2014-08-28 NOTE — Assessment & Plan Note (Signed)

## 2014-08-28 NOTE — Patient Instructions (Signed)
F/u in 3 month, call if you need me before  CONGRATS on [facility] IMPROVED blood work, keep it up!  It is important that you exercise regularly at least 30 minutes 5 times a week. If you develop chest pain, have severe difficulty breathing, or feel very tired, stop exercising immediately and seek medical attention  A healthy diet is rich in fruit, vegetables and whole grains. Poultry fish, nuts and beans are a healthy choice for protein rather then red meat. A low sodium diet and drinking 64 ounces of water daily is generally recommended. Oils and sweet should be limited. Carbohydrates especially for those who are diabetic or overweight, should be limited to 60-45 gram per meal. It is important to eat on a regular schedule, at least 3 times daily. Snacks should be primarily fruits, vegetables or nuts.   You are referred to diabetic educator, very important that you get , this will [place] are referred to [facility] contact at [facility] re diabetes , this help with supplies, and is another support  Continue all current meds  Start testing and writing blood sugar down every day, before breakfast, 2 hours after any meal and at bedtime  /Goal for fasting blood sugar ranges from 90 to 130 and 2 hours after any meal or at bedtime should be between 140 to 180.  Call every Friday to nurse so I  Can adjust your lantus dose as needed for at least next 4 weeks, we will let you know when OK to stop. PLEASE call in at any time with concerns/ questions about blood sugar  Start taking 5 units lantus every  evening , bedtime, or every morning , whchever you prefer, but SAME TIME EVERY DAY  Start with 5 units daily, script says 15 units daily, I will increase based on your weekly calls. START AT 5 units daily please  We will let you know about eye exam  Fasting lipid, cmp , and EGFr, HBA1c in 3 months

## 2014-08-28 NOTE — Assessment & Plan Note (Signed)
Marked improvement, pt to continue current med Hyperlipidemia:Low fat diet discussed and encouraged.   Lipid Panel  Lab Results  Component Value Date   CHOL 151 08/27/2014   HDL 33* 08/27/2014   LDLCALC 101* 08/27/2014   TRIG 84 08/27/2014   CHOLHDL 4.6 08/27/2014      Updated lab needed at/ before next visit.

## 2014-08-28 NOTE — Progress Notes (Signed)
   Subjective:    Patient ID: [name] Fernandez, female    DOB: [birth year], 39 y.o.   MRN: [id number]  HPI Patient is in for annual physical exam. Recent labs are reviewed, dose adjustment to diabetes management with the addition of insulin is made. Immunization is reviewed ,  C/o bi;lateral heel pain primarily after initiating walking after sitting for a while, relates to changing shoes, advised use of heel cushions and also use of topical massage at night with ice bottles    Review of Systems See HPI .    Objective:   Physical Exam BP 124/72 mmHg  Pulse 76  Resp 18  Ht 5' (1.524 m)  Wt 128 lb 1.9 oz (58.115 kg)  BMI 25.02 kg/m2  SpO2 98%  Pleasant well nourished female, alert and oriented x 3, in no cardio-pulmonary distress. Afebrile. HEENT No facial trauma or asymetry. Sinuses non tender.  Extra occullar muscles intact, pupils equally reactive to light. External ears normal, tympanic membranes clear. Oropharynx moist, no exudate, good dentition. Neck: supple, no adenopathy,JVD or thyromegaly.No bruits.  Chest: Clear to ascultation bilaterally.No crackles or wheezes. Non tender to palpation  Breast: No asymetry,no masses or lumps. No tenderness. No nipple discharge or inversion. No axillary or supraclavicular adenopathy  Cardiovascular system; Heart sounds normal,  S1 and  S2 ,no S3.  No murmur, or thrill. Apical beat not displaced Peripheral pulses normal.  Abdomen: Soft, non tender, no organomegaly or masses. No bruits. Bowel sounds normal. No guarding, tenderness or rebound.    GU: External genitalia normal female genitalia , female distribution of hair. No lesions. Urethral meatus normal in size, no  Prolapse, no lesions visibly  Present. Bladder non tender. Vagina pink and moist , with no visible lesions , discharge present . Adequate pelvic support no  cystocele or rectocele noted Cervix pink and appears healthy, no lesions or ulcerations noted,  no discharge noted from os Uterus normal size, no adnexal masses, no cervical motion or adnexal tenderness.iUD in place   Musculoskeletal exam: Full ROM of spine, hips , shoulders and knees. No deformity ,swelling or crepitus noted. No muscle wasting or atrophy.   Neurologic: Cranial nerves 2 to 12 intact. Power, tone ,sensation and reflexes normal throughout. No disturbance in gait. No tremor.  Skin: Intact, no ulceration, erythema , scaling or rash noted. Pigmentation normal throughout  Psych; Normal mood and affect. Judgement and concentration normal        Assessment & Plan:

## 2014-08-30 LAB — CYTOLOGY - PAP

## 2014-09-13 ENCOUNTER — Other Ambulatory Visit: Payer: Self-pay | Admitting: Family Medicine

## 2014-10-24 ENCOUNTER — Ambulatory Visit: Payer: Self-pay | Admitting: Nutrition

## 2014-11-01 ENCOUNTER — Other Ambulatory Visit: Payer: Self-pay

## 2014-11-01 MED ORDER — GLIPIZIDE ER 10 MG PO TB24: 20.0000 mg | ORAL_TABLET | Freq: Every day | ORAL | Status: DC

## 2014-11-28 ENCOUNTER — Ambulatory Visit: Payer: 59 | Admitting: Family Medicine

## 2014-12-19 ENCOUNTER — Ambulatory Visit: Payer: Self-pay | Admitting: Nutrition

## 2015-01-27 ENCOUNTER — Telehealth: Payer: Self-pay | Admitting: Nutrition

## 2015-01-27 NOTE — Telephone Encounter (Signed)
vm to call and reschedule missed appt. PC

## 2015-02-10 ENCOUNTER — Telehealth: Payer: Self-pay | Admitting: Nutrition

## 2015-02-10 NOTE — Telephone Encounter (Signed)
vm to reschedulel missed appointment PC

## 2015-02-13 ENCOUNTER — Other Ambulatory Visit: Payer: Self-pay

## 2015-02-13 DIAGNOSIS — I10 Essential (primary) hypertension: Secondary | ICD-10-CM

## 2015-02-13 DIAGNOSIS — F411 Generalized anxiety disorder: Secondary | ICD-10-CM

## 2015-02-13 MED ORDER — BUSPIRONE HCL 5 MG PO TABS: 5.0000 mg | ORAL_TABLET | Freq: Two times a day (BID) | ORAL | Status: DC

## 2015-02-13 MED ORDER — LISINOPRIL 20 MG PO TABS: 20.0000 mg | ORAL_TABLET | Freq: Every day | ORAL | Status: DC

## 2015-03-03 LAB — HM DIABETES EYE EXAM

## 2015-03-12 ENCOUNTER — Encounter: Payer: Self-pay | Admitting: Nutrition

## 2015-03-14 ENCOUNTER — Other Ambulatory Visit: Payer: Self-pay

## 2015-04-17 ENCOUNTER — Encounter: Payer: Self-pay | Admitting: Advanced Practice Midwife

## 2015-04-17 ENCOUNTER — Ambulatory Visit (INDEPENDENT_AMBULATORY_CARE_PROVIDER_SITE_OTHER): Payer: 59 | Admitting: Advanced Practice Midwife

## 2015-04-17 DIAGNOSIS — N72 Inflammatory disease of cervix uteri: Secondary | ICD-10-CM

## 2015-04-17 DIAGNOSIS — N921 Excessive and frequent menstruation with irregular cycle: Secondary | ICD-10-CM

## 2015-04-17 DIAGNOSIS — Z975 Presence of (intrauterine) contraceptive device: Secondary | ICD-10-CM

## 2015-04-17 MED ORDER — METRONIDAZOLE 0.75 % VA GEL: 1.0000 | Freq: Two times a day (BID) | VAGINAL | Status: AC

## 2015-04-17 NOTE — Progress Notes (Signed)
[facility] Visit  Patient name: [name] Fernandez MRN [id number]  Date of birth: 1975-06-02  CC & HPI:  [name] Fernandez is a 39 y.o. African American female presenting today for IUD check. She had a Mirena IUD place 4 3/4 years ago.  She has typically not had any bleeding, but has recently started bleeding.  Spotting only.  No pain, discharge, or odor.  Husband is bothered by strings (for the past 4 3/4 years!), so wants them trimmed  Pertinent History Reviewed:  Medical & Surgical Hx:   Past Medical History  Diagnosis Date  . Hyperlipidemia   . Hypertension   . Diabetes mellitus   . Depression   . Anxiety   . GERD (gastroesophageal reflux disease)   . Headache(784.0)   . Arthritis    Past Surgical History  Procedure Laterality Date  . Breast surgery      right lumpectomy, benign  . Cholecystectomy N/A 12/27/2013    Procedure: LAPAROSCOPIC CHOLECYSTECTOMY ;  Surgeon: Marlane Hatcher, MD;  Location: [facility];  Service: General;  Laterality: N/A;   Family History  Problem Relation Age of Onset  . Depression Mother   . Alcohol abuse Mother   . Depression Brother   . Sickle cell anemia Brother   . Anxiety disorder Maternal Aunt     Current outpatient prescriptions:  .  BIOTIN PO, Take 10,000 Units by mouth., Disp: , Rfl:  .  busPIRone (BUSPAR) 5 MG tablet, Take 1 tablet (5 mg total) by mouth 2 (two) times daily., Disp: 180 tablet, Rfl: 0 .  glipiZIDE (GLUCOTROL XL) 10 MG 24 hr tablet, Take 2 tablets (20 mg total) by mouth daily., Disp: 180 tablet, Rfl: 1 .  Insulin Glargine (LANTUS) 100 UNIT/ML Solostar Pen, Inject 15 Units into the skin daily at 10 pm., Disp: 15 mL, Rfl: 11 .  INVOKANA 300 MG TABS tablet, TAKE 1 TABLET BY MOUTH ONCE DAILY, Disp: 90 tablet, Rfl: 1 .  lisinopril (PRINIVIL,ZESTRIL) 20 MG tablet, Take 1 tablet (20 mg total) by mouth daily., Disp: 90 tablet, Rfl: 0 .  rosuvastatin (CRESTOR) 40 MG tablet, Take 1 tablet (40 mg total) by mouth daily.,  Disp: 90 tablet, Rfl: 1 .  sitaGLIPtin (JANUVIA) 100 MG tablet, Take 1 tablet (100 mg total) by mouth daily., Disp: 90 tablet, Rfl: 1 .  metroNIDAZOLE (METROGEL VAGINAL) 0.75 % vaginal gel, Place 1 Applicatorful vaginally 2 (two) times daily., Disp: 70 g, Rfl: 0 Social History: Reviewed -  reports that she has never smoked. She has never used smokeless tobacco.  Review of Systems:   Constitutional: Negative for fever and chills Eyes: Negative for visual disturbances Respiratory: Negative for shortness of breath, dyspnea Cardiovascular: Negative for chest pain or palpitations  Gastrointestinal: Negative for vomiting, diarrhea and constipation; no abdominal pain Genitourinary: Negative for dysuria and urgency, vaginal irritation or itching Musculoskeletal: Negative for back pain, joint pain, myalgias  Neurological: Negative for dizziness and headaches    Objective Findings:    Physical Examination: General appearance - well appearing, and in no distress Mental status - alert, oriented to person, place, and time Chest:  Normal respiratory effort Heart - normal rate and regular rhythm Abdomen:  Soft, nontender Pelvic: SSE:  DC normal, Cx friable.  Strings barely visible at os Beside US shows what appears to be a properly placed IUD Musculoskeletal:  Normal range of motion without pain Extremities:  No edema    No results found for this or  any previous visit (from the past 24 hour(s)).    Assessment & Plan:  A:   BTB, most likely dt cervicitis P:  Rx Metrogel  Change IUD in March   Return if symptoms worsen or fail to improve.  CRESENZO-DISHMAN,Izzabelle Bouley CNM 04/17/2015 2:50 PM

## 2015-04-30 ENCOUNTER — Ambulatory Visit: Payer: Self-pay | Admitting: Family Medicine

## 2015-04-30 ENCOUNTER — Encounter: Payer: Self-pay | Admitting: Family Medicine

## 2015-06-03 ENCOUNTER — Encounter: Payer: Self-pay | Admitting: Women's Health

## 2015-06-03 ENCOUNTER — Ambulatory Visit (INDEPENDENT_AMBULATORY_CARE_PROVIDER_SITE_OTHER): Payer: 59 | Admitting: Women's Health

## 2015-06-03 VITALS — BP 136/70 | HR 80 | Wt 132.0 lb

## 2015-06-03 DIAGNOSIS — Z30432 Encounter for removal of intrauterine contraceptive device: Secondary | ICD-10-CM

## 2015-06-03 DIAGNOSIS — Z30011 Encounter for initial prescription of contraceptive pills: Secondary | ICD-10-CM

## 2015-06-03 MED ORDER — NORETHIN-ETH ESTRAD-FE BIPHAS 1 MG-10 MCG / 10 MCG PO TABS: 1.0000 | ORAL_TABLET | Freq: Every day | ORAL | Status: DC

## 2015-06-03 NOTE — Progress Notes (Signed)
Patient ID: [name] Fernandez, female   DOB: 1975-10-21, 40 y.o.   MRN: [id number] [name] Fernandez is a 40 y.o. year old African American female who presents for removal of a Mirena IUD. Her Mirena IUD was placed 5 years ago and she does not want another one as the strings poke her partner all the time and irritates his penis. Same thing happened w/ IUD she had prior to this one as well. Wants to get on COCs. Does have CHTN- controlled on meds, DM on meds, hyperlipidemia- on meds. Does not smoke, no h/o DVT/PE, CVA, MI, or migraines w/ aura. Understands estrogen can increase her r/f DVT, CVA, MI, especially w/ her comorbidities and IUD/progestin only is safest thing for her, although COCs are not a complete contraindication for her at this time. She prefers COCs and accepts risks- states she just wants to try them out and if changes mind and wants IUD will call us and let us know.   No LMP recorded. Patient is not currently having periods (Reason: IUD). BP 136/70 mmHg  Pulse 80  Wt 132 lb (59.875 kg)  Time out was performed.  A graves speculum was placed in the vagina.  The cervix was visualized, and the strings were not visible. [place] forceps were used to grasp strings inside of cervix and IUD was easily removed intact without complications.   Rx LoLoestrin 3packs w/ 3RF, condoms x 2wks for back-up Set alarm to help remember to take at same time daily F/U 3 months for COC f/u If decides wants another IUD instead call and let us know   Marge Duncans CNM, [facility] 06/03/2015 4:55 PM

## 2015-06-03 NOTE — Patient Instructions (Addendum)
Set an alarm to help you remember to take pills at same time every day  Oral Contraception Use Oral contraceptive pills (OCPs) are medicines taken to prevent pregnancy. OCPs work by preventing the ovaries from releasing eggs. The hormones in OCPs also cause the cervical mucus to thicken, preventing the sperm from entering the uterus. The hormones also cause the uterine lining to become thin, not allowing a fertilized egg to attach to the inside of the uterus. OCPs are highly effective when taken exactly as prescribed. However, OCPs do not prevent sexually transmitted diseases (STDs). Safe sex practices, such as using condoms along with an OCP, can help prevent STDs. Before taking OCPs, you may have a physical exam and Pap test. Your health care provider may also order blood tests if necessary. Your health care provider will make sure you are a good candidate for oral contraception. Discuss with your health care provider the possible side effects of the OCP you may be prescribed. When starting an OCP, it can take 2 to 3 months for the body to adjust to the changes in hormone levels in your body.  HOW TO TAKE ORAL CONTRACEPTIVE PILLS Your health care provider may advise you on how to start taking the first cycle of OCPs. Otherwise, you can:   Start on day 1 of your menstrual period. You will not need any backup contraceptive protection with this start time.   Start on the first Sunday after your menstrual period or the day you get your prescription. In these cases, you will need to use backup contraceptive protection for the first week.   Start the pill at any time of your cycle. If you take the pill within 5 days of the start of your period, you are protected against pregnancy right away. In this case, you will not need a backup form of birth control. If you start at any other time of your menstrual cycle, you will need to use another form of birth control for 7 days. If your OCP is the type called a  minipill, it will protect you from pregnancy after taking it for 2 days (48 hours). After you have started taking OCPs:   If you forget to take 1 pill, take it as soon as you remember. Take the next pill at the regular time.   If you miss 2 or more pills, call your health care provider because different pills have different instructions for missed doses. Use backup birth control until your next menstrual period starts.   If you use a 28-day pack that contains inactive pills and you miss 1 of the last 7 pills (pills with no hormones), it will not matter. Throw away the rest of the non-hormone pills and start a new pill pack.  No matter which day you start the OCP, you will always start a new pack on that same day of the week. Have an extra pack of OCPs and a backup contraceptive method available in case you miss some pills or lose your OCP pack.  HOME CARE INSTRUCTIONS   Do not smoke.   Always use a condom to protect against STDs. OCPs do not protect against STDs.   Use a calendar to mark your menstrual period days.   Read the information and directions that came with your OCP. Talk to your health care provider if you have questions.  SEEK MEDICAL CARE IF:   You develop nausea and vomiting.   You have abnormal vaginal discharge or bleeding.  You develop a rash.   You miss your menstrual period.   You are losing your hair.   You need treatment for mood swings or depression.   You get dizzy when taking the OCP.   You develop acne from taking the OCP.   You become pregnant.  SEEK IMMEDIATE MEDICAL CARE IF:   You develop chest pain.   You develop shortness of breath.   You have an uncontrolled or severe headache.   You develop numbness or slurred speech.   You develop visual problems.   You develop pain, redness, and swelling in the legs.    This information is not intended to replace advice given to you by your health care provider. Make sure you  discuss any questions you have with your health care provider.   Document Released: 04/15/2011 Document Revised: 05/17/2014 Document Reviewed: 10/15/2012 Elsevier Interactive Patient Education 2016 [employer]  Ethinyl Estradiol; Norethindrone Acetate tablets (contraception) What is this medicine? ETHINYL ESTRADIOL; NORETHINDRONE ACETATE (ETH in il es tra DYE ole; nor eth IN drone AS e tate) is an oral contraceptive. The products combine two types of female hormones, an estrogen and a progestin. They are used to prevent ovulation and pregnancy. This medicine may be used for other purposes; ask your health care provider or pharmacist if you have questions. What should I tell my health care provider before I take this medicine? They need to know if you have or ever had any of these conditions: -abnormal vaginal bleeding -blood vessel disease or blood clots -breast, cervical, endometrial, ovarian, liver, or uterine cancer -diabetes -gallbladder disease -heart disease or recent heart attack -high blood pressure -high cholesterol -kidney disease -liver disease -migraine headaches -stroke -systemic lupus erythematosus (SLE) -tobacco smoker -an unusual or allergic reaction to estrogens, progestins, other medicines, foods, dyes, or preservatives -pregnant or trying to get pregnant -breast-feeding How should I use this medicine? Take this medicine by mouth. To reduce nausea, this medicine may be taken with food. Follow the directions on the prescription label. Take this medicine at the same time each day and in the order directed on the package. Do not take your medicine more often than directed. Contact your pediatrician regarding the use of this medicine in children. Special care may be needed. This medicine has been used in female children who have started having menstrual periods. A patient package insert for the product will be given with each prescription and refill. Read this sheet  carefully each time. The sheet may change frequently. Overdosage: If you think you have taken too much of this medicine contact a poison control center or emergency room at once. NOTE: This medicine is only for you. Do not share this medicine with others. What if I miss a dose? If you miss a dose, refer to the patient information sheet you received with your medicine for direction. If you miss more than one pill, this medicine may not be as effective and you may need to use another form of birth control. What may interact with this medicine? -acetaminophen -antibiotics or medicines for infections, especially rifampin, rifabutin, rifapentine, and griseofulvin, and possibly penicillins or tetracyclines -aprepitant -ascorbic acid (vitamin C) -atorvastatin -barbiturate medicines, such as phenobarbital -bosentan -carbamazepine -caffeine -clofibrate -cyclosporine -dantrolene -doxercalciferol -felbamate -grapefruit juice -hydrocortisone -medicines for anxiety or sleeping problems, such as diazepam or temazepam -medicines for diabetes, including pioglitazone -mineral oil -modafinil -mycophenolate -nefazodone -oxcarbazepine -phenytoin -prednisolone -ritonavir or other medicines for HIV infection or AIDS -rosuvastatin -selegiline -soy isoflavones supplements -St.  John's wort -tamoxifen or raloxifene -theophylline -thyroid hormones -topiramate -warfarin This list may not describe all possible interactions. Give your health care provider a list of all the medicines, herbs, non-prescription drugs, or dietary supplements you use. Also tell them if you smoke, drink alcohol, or use illegal drugs. Some items may interact with your medicine. What should I watch for while using this medicine? Visit your doctor or health care professional for regular checks on your progress. You will need a regular breast and pelvic exam and Pap smear while on this medicine. Use an additional method of  contraception during the first cycle that you take these tablets. If you have any reason to think you are pregnant, stop taking this medicine right away and contact your doctor or health care professional. If you are taking this medicine for hormone related problems, it may take several cycles of use to see improvement in your condition. Smoking increases the risk of getting a blood clot or having a stroke while you are taking birth control pills, especially if you are more than 38 years old. You are strongly advised not to smoke. This medicine can make your body retain fluid, making your fingers, hands, or ankles swell. Your blood pressure can go up. Contact your doctor or health care professional if you feel you are retaining fluid. This medicine can make you more sensitive to the sun. Keep out of the sun. If you cannot avoid being in the sun, wear protective clothing and use sunscreen. Do not use sun lamps or tanning beds/booths. If you wear contact lenses and notice visual changes, or if the lenses begin to feel uncomfortable, consult your eye care specialist. In some women, tenderness, swelling, or minor bleeding of the gums may occur. Notify your dentist if this happens. Brushing and flossing your teeth regularly may help limit this. See your dentist regularly and inform your dentist of the medicines you are taking. If you are going to have elective surgery, you may need to stop taking this medicine before the surgery. Consult your health care professional for advice. This medicine does not protect you against HIV infection (AIDS) or any other sexually transmitted diseases. What side effects may I notice from receiving this medicine? Side effects that you should report to your doctor or health care professional as soon as possible: -breast tissue changes or discharge -changes in vaginal bleeding during your period or between your periods -chest pain -coughing up blood -dizziness or fainting  spells -headaches or migraines -leg, arm or groin pain -severe or sudden headaches -stomach pain (severe) -sudden shortness of breath -sudden loss of coordination, especially on one side of the body -speech problems -symptoms of vaginal infection like itching, irritation or unusual discharge -tenderness in the upper abdomen -vomiting -weakness or numbness in the arms or legs, especially on one side of the body -yellowing of the eyes or skin Side effects that usually do not require medical attention (report to your doctor or health care professional if they continue or are bothersome): -breakthrough bleeding and spotting that continues beyond the 3 initial cycles of pills -breast tenderness -mood changes, anxiety, depression, frustration, anger, or emotional outbursts -increased sensitivity to sun or ultraviolet light -nausea -skin rash, acne, or brown spots on the skin -weight gain (slight) This list may not describe all possible side effects. Call your doctor for medical advice about side effects. You may report side effects to FDA at 1-800-FDA-1088. Where should I keep my medicine? Keep out of the reach of  children. Store at room temperature between 15 and 30 degrees C (59 and 86 degrees F). Throw away any unused medicine after the expiration date. NOTE: This sheet is a summary. It may not cover all possible information. If you have questions about this medicine, talk to your doctor, pharmacist, or health care provider.    2016, Elsevier/[employer] Standard. (2012-09-01 15:35:20)

## 2015-06-04 MED FILL — metroNIDAZOLE 0.75 % GEL: 0.75 | 5 days supply | Qty: 70 | Fill #0

## 2015-06-05 MED FILL — LO LOESTRIN FE 1-10 TABLET: 1 MG-10 MCG | 84 days supply | Qty: 84 | Fill #0

## 2015-06-16 ENCOUNTER — Telehealth: Payer: Self-pay | Admitting: Women's Health

## 2015-06-16 NOTE — Telephone Encounter (Signed)
Pt c/o vaginal bleeding x 3 weeks since going from IUD to BCP. Pt informed common to have BTB when switching birth control. Pt informed could take up to  3-6 months to regulate. Pt to call our office back as needed. Pt verbalized understanding.

## 2015-08-22 MED FILL — LO LOESTRIN FE 1-10 TABLET: 1 MG-10 MCG | 84 days supply | Qty: 84 | Fill #1

## 2015-09-02 ENCOUNTER — Ambulatory Visit: Payer: 59 | Admitting: Women's Health

## 2015-09-30 ENCOUNTER — Ambulatory Visit (INDEPENDENT_AMBULATORY_CARE_PROVIDER_SITE_OTHER): Payer: 59

## 2015-09-30 ENCOUNTER — Telehealth: Payer: Self-pay

## 2015-09-30 DIAGNOSIS — E785 Hyperlipidemia, unspecified: Secondary | ICD-10-CM

## 2015-09-30 DIAGNOSIS — Z111 Encounter for screening for respiratory tuberculosis: Secondary | ICD-10-CM

## 2015-09-30 DIAGNOSIS — Z114 Encounter for screening for human immunodeficiency virus [HIV]: Secondary | ICD-10-CM

## 2015-09-30 DIAGNOSIS — E1165 Type 2 diabetes mellitus with hyperglycemia: Secondary | ICD-10-CM

## 2015-09-30 DIAGNOSIS — I10 Essential (primary) hypertension: Secondary | ICD-10-CM

## 2015-09-30 MED ORDER — SITAGLIPTIN PHOSPHATE 100 MG PO TABS: 100.0000 mg | ORAL_TABLET | Freq: Every day | ORAL | Status: DC

## 2015-09-30 MED ORDER — GLIPIZIDE ER 10 MG PO TB24: 20.0000 mg | ORAL_TABLET | Freq: Every day | ORAL | Status: DC

## 2015-09-30 MED ORDER — INSULIN GLARGINE 100 UNIT/ML SOLOSTAR PEN: 15.0000 [IU] | PEN_INJECTOR | Freq: Every day | SUBCUTANEOUS | Status: DC

## 2015-09-30 MED ORDER — CANAGLIFLOZIN 300 MG PO TABS: 300.0000 mg | ORAL_TABLET | Freq: Every day | ORAL | Status: DC

## 2015-09-30 MED ORDER — ROSUVASTATIN CALCIUM 40 MG PO TABS: 40.0000 mg | ORAL_TABLET | Freq: Every day | ORAL | Status: DC

## 2015-09-30 MED ORDER — LISINOPRIL 20 MG PO TABS: 20.0000 mg | ORAL_TABLET | Freq: Every day | ORAL | Status: DC

## 2015-09-30 MED FILL — ROSUVASTATIN CALCIUM 40 MG: 40 | 30 days supply | Qty: 30 | Fill #0

## 2015-09-30 MED FILL — LISINOPRIL 20 MG TABLET: 20 | 30 days supply | Qty: 30 | Fill #0

## 2015-09-30 MED FILL — INVOKANA 300 MG TABLET: 300 | 30 days supply | Qty: 30 | Fill #0

## 2015-09-30 MED FILL — glipiZIDE XL 10 MG TB24: 10 | 30 days supply | Qty: 60 | Fill #0

## 2015-09-30 MED FILL — JANUVIA 100 MG TABLET: 100 | 30 days supply | Qty: 30 | Fill #0

## 2015-09-30 NOTE — Telephone Encounter (Signed)
Lab order placed.  Patient will have done as soon as possible.

## 2015-09-30 NOTE — Progress Notes (Signed)
Patient in for ppd placement.  Will return on 5/25 for reading.

## 2015-10-02 ENCOUNTER — Ambulatory Visit (INDEPENDENT_AMBULATORY_CARE_PROVIDER_SITE_OTHER): Payer: 59 | Admitting: Psychiatry

## 2015-10-02 ENCOUNTER — Encounter (HOSPITAL_COMMUNITY): Payer: Self-pay | Admitting: Psychiatry

## 2015-10-02 DIAGNOSIS — F329 Major depressive disorder, single episode, unspecified: Secondary | ICD-10-CM | POA: Diagnosis not present

## 2015-10-02 DIAGNOSIS — F411 Generalized anxiety disorder: Secondary | ICD-10-CM | POA: Diagnosis not present

## 2015-10-02 DIAGNOSIS — F32A Depression, unspecified: Secondary | ICD-10-CM

## 2015-10-02 LAB — TB SKIN TEST
Induration: 0 mm
TB SKIN TEST: NEGATIVE

## 2015-10-02 NOTE — Patient Instructions (Signed)
Discussed orally 
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
## 2015-10-21 ENCOUNTER — Ambulatory Visit (INDEPENDENT_AMBULATORY_CARE_PROVIDER_SITE_OTHER): Payer: 59

## 2015-10-21 DIAGNOSIS — Z111 Encounter for screening for respiratory tuberculosis: Secondary | ICD-10-CM

## 2015-10-21 NOTE — Progress Notes (Signed)
PPD placed in right forearm and instructed to return Thursday to have it read. No complications

## 2015-10-22 ENCOUNTER — Encounter: Payer: Self-pay | Admitting: Family Medicine

## 2015-10-22 ENCOUNTER — Ambulatory Visit (INDEPENDENT_AMBULATORY_CARE_PROVIDER_SITE_OTHER): Payer: 59 | Admitting: Family Medicine

## 2015-10-22 VITALS — BP 142/74 | HR 98 | Resp 18 | Ht 60.0 in | Wt 126.0 lb

## 2015-10-22 DIAGNOSIS — E1165 Type 2 diabetes mellitus with hyperglycemia: Secondary | ICD-10-CM

## 2015-10-22 DIAGNOSIS — F418 Other specified anxiety disorders: Secondary | ICD-10-CM

## 2015-10-22 DIAGNOSIS — F329 Major depressive disorder, single episode, unspecified: Secondary | ICD-10-CM | POA: Insufficient documentation

## 2015-10-22 DIAGNOSIS — Z114 Encounter for screening for human immunodeficiency virus [HIV]: Secondary | ICD-10-CM | POA: Diagnosis not present

## 2015-10-22 DIAGNOSIS — E785 Hyperlipidemia, unspecified: Secondary | ICD-10-CM

## 2015-10-22 DIAGNOSIS — IMO0002 Reserved for concepts with insufficient information to code with codable children: Secondary | ICD-10-CM

## 2015-10-22 DIAGNOSIS — F419 Anxiety disorder, unspecified: Principal | ICD-10-CM

## 2015-10-22 DIAGNOSIS — E119 Type 2 diabetes mellitus without complications: Secondary | ICD-10-CM | POA: Diagnosis not present

## 2015-10-22 DIAGNOSIS — E1169 Type 2 diabetes mellitus with other specified complication: Secondary | ICD-10-CM | POA: Diagnosis not present

## 2015-10-22 DIAGNOSIS — I10 Essential (primary) hypertension: Secondary | ICD-10-CM

## 2015-10-22 DIAGNOSIS — Z1159 Encounter for screening for other viral diseases: Secondary | ICD-10-CM | POA: Diagnosis not present

## 2015-10-22 DIAGNOSIS — F32A Depression, unspecified: Secondary | ICD-10-CM

## 2015-10-22 LAB — CBC
HCT: 39.8 % (ref 35.0–45.0)
Hemoglobin: 13.1 g/dL (ref 11.7–15.5)
MCH: 26.2 pg — ABNORMAL LOW (ref 27.0–33.0)
MCHC: 32.9 g/dL (ref 32.0–36.0)
MCV: 79.6 fL — ABNORMAL LOW (ref 80.0–100.0)
MPV: 10.7 fL (ref 7.5–12.5)
Platelets: 323 10*3/uL (ref 140–400)
RBC: 5 MIL/uL (ref 3.80–5.10)
RDW: 14.6 % (ref 11.0–15.0)
WBC: 7.1 10*3/uL (ref 3.8–10.8)

## 2015-10-22 MED ORDER — FLUCONAZOLE 150 MG PO TABS: ORAL_TABLET | ORAL | Status: DC

## 2015-10-22 MED ORDER — FLUOXETINE HCL 20 MG PO TABS: 20.0000 mg | ORAL_TABLET | Freq: Every day | ORAL | Status: DC

## 2015-10-22 MED FILL — FLUoxetine HCL 20 MG CAPS: 20 | 90 days supply | Qty: 90 | Fill #0

## 2015-10-22 MED FILL — FLUCONAZOLE 150 MG TABLET: 150 | 28 days supply | Qty: 4 | Fill #0

## 2015-10-22 NOTE — Patient Instructions (Signed)
F/u in 4 weeks, call if you need me before  Please commit to taking medication as prescribed and checking sugar  Foot exam today is good   Continue therapy as before
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
## 2015-10-23 ENCOUNTER — Encounter (HOSPITAL_COMMUNITY): Payer: Self-pay | Admitting: Psychiatry

## 2015-10-23 ENCOUNTER — Ambulatory Visit (INDEPENDENT_AMBULATORY_CARE_PROVIDER_SITE_OTHER): Payer: 59 | Admitting: Psychiatry

## 2015-10-23 DIAGNOSIS — F32A Depression, unspecified: Secondary | ICD-10-CM

## 2015-10-23 DIAGNOSIS — F329 Major depressive disorder, single episode, unspecified: Secondary | ICD-10-CM

## 2015-10-23 DIAGNOSIS — F411 Generalized anxiety disorder: Secondary | ICD-10-CM

## 2015-10-23 LAB — COMPLETE METABOLIC PANEL WITH GFR
ALBUMIN: 3.9 g/dL (ref 3.6–5.1)
ALK PHOS: 48 U/L (ref 33–115)
ALT: 14 U/L (ref 6–29)
AST: 16 U/L (ref 10–30)
BUN: 9 mg/dL (ref 7–25)
CO2: 18 mmol/L — ABNORMAL LOW (ref 20–31)
Calcium: 9.2 mg/dL (ref 8.6–10.2)
Chloride: 103 mmol/L (ref 98–110)
Creat: 0.59 mg/dL (ref 0.50–1.10)
GFR, Est African American: >89 mL/min (ref >=60)
GLUCOSE: 250 mg/dL — AB (ref 65–99)
POTASSIUM: 4.1 mmol/L (ref 3.5–5.3)
SODIUM: 139 mmol/L (ref 135–146)
Total Bilirubin: 0.5 mg/dL (ref 0.2–1.2)
Total Protein: 7.6 g/dL (ref 6.1–8.1)

## 2015-10-23 LAB — TB SKIN TEST
INDURATION: 0 mm
TB SKIN TEST: NEGATIVE

## 2015-10-23 LAB — HEMOGLOBIN A1C
HEMOGLOBIN A1C: 13.3 % — AB (ref <5.7)
Mean Plasma Glucose: 335 mg/dL

## 2015-10-23 LAB — LIPID PANEL
CHOLESTEROL: 277 mg/dL — AB (ref 125–200)
HDL: 39 mg/dL — AB (ref >=46)
LDL Cholesterol: 199 mg/dL — ABNORMAL HIGH (ref <130)
Total CHOL/HDL Ratio: 7.1 Ratio — ABNORMAL HIGH (ref <=5.0)
Triglycerides: 195 mg/dL — ABNORMAL HIGH (ref <150)
VLDL: 39 mg/dL — ABNORMAL HIGH (ref <30)

## 2015-10-23 LAB — TSH: TSH: 1.09 mIU/L

## 2015-10-23 LAB — HIV ANTIBODY (ROUTINE TESTING W REFLEX): HIV: NONREACTIVE

## 2015-10-23 NOTE — Patient Instructions (Signed)
Discussed orally 

## 2015-10-23 NOTE — Assessment & Plan Note (Addendum)
Updated lab needed at/ before next visit. Uncontrolled based on symptoms and the fact that she has been taking no medication F/u in 4 weeks

## 2015-10-23 NOTE — Assessment & Plan Note (Signed)
Uncontrolled, needs to resume medication , return in 4 weeks for re evaluation DASH diet and commitment to daily physical activity for a minimum of 30 minutes discussed and encouraged, as a part of hypertension management. The importance of attaining a healthy weight is also discussed.  BP/Weight 10/22/2015 06/03/2015 08/28/2014 05/23/2014 02/06/2014 02/06/2014 01/02/2014  Systolic BP 142 136 124 122 148 125 122  Diastolic BP 74 70 72 84 86 86 80  Wt. (Lbs) 126 132 128.12 134.08 125.12 127.6 121  BMI 24.61 25.78 25.02 26.19 24.44 24.92 23.63

## 2015-10-23 NOTE — Assessment & Plan Note (Signed)
Continue therapy, start fluoxetine, f/u in 4 weeks Not suicidal or homicidal

## 2015-10-23 NOTE — Assessment & Plan Note (Signed)
Updated laboon day of visit , will need to follow up.
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
## 2015-10-24 ENCOUNTER — Encounter: Payer: Self-pay | Admitting: Family Medicine

## 2015-10-25 LAB — MICROALBUMIN / CREATININE URINE RATIO
CREATININE, URINE: 142 mg/dL (ref 20–320)
Microalb Creat Ratio: 254 mcg/mg creat — ABNORMAL HIGH (ref <30)
Microalb, Ur: 36 mg/dL — ABNORMAL HIGH

## 2015-11-03 ENCOUNTER — Other Ambulatory Visit: Payer: Self-pay

## 2015-11-03 ENCOUNTER — Other Ambulatory Visit: Payer: Self-pay | Admitting: Family Medicine

## 2015-11-03 DIAGNOSIS — E1165 Type 2 diabetes mellitus with hyperglycemia: Secondary | ICD-10-CM

## 2015-11-03 MED ORDER — SITAGLIPTIN PHOSPHATE 100 MG PO TABS: 100.0000 mg | ORAL_TABLET | Freq: Every day | ORAL | Status: DC

## 2015-11-03 MED FILL — JANUVIA 100 MG TABLET: 100 | 90 days supply | Qty: 90 | Fill #0

## 2015-11-10 ENCOUNTER — Encounter (HOSPITAL_COMMUNITY): Payer: Self-pay | Admitting: Psychiatry

## 2015-11-10 ENCOUNTER — Ambulatory Visit (INDEPENDENT_AMBULATORY_CARE_PROVIDER_SITE_OTHER): Payer: 59 | Admitting: Psychiatry

## 2015-11-10 DIAGNOSIS — F411 Generalized anxiety disorder: Secondary | ICD-10-CM | POA: Diagnosis not present

## 2015-11-10 DIAGNOSIS — F329 Major depressive disorder, single episode, unspecified: Secondary | ICD-10-CM | POA: Diagnosis not present

## 2015-11-10 DIAGNOSIS — F32A Depression, unspecified: Secondary | ICD-10-CM

## 2015-11-10 NOTE — Patient Instructions (Signed)
Discussed orally 

## 2015-11-10 NOTE — Progress Notes (Signed)
    THERAPIST PROGRESS NOTE  Session Time:     Monday 11/10/2015 4:07 PM -  4:48 PM  Participation Level: Active  Behavioral Response: CasualAlertAnxious  Type of Therapy: Individual Therapy  Treatment Goals addressed: Establish rapport, learn and implement calming skills to manage overall anxiety  Interventions: Supportive  Summary: [name] Fernandez is a 40 y.o. female who presents with symptoms of anxiety and depression. She says she began exoeriencing symptoms of anxiety in 2015 after having gallbladder surgery. Symptoms were severe for about a week at that time and patient reports eventually being able to resume her usual activities. She continues to experience nervousness at times and says it becomes overwhelming about once every 2-3 months. She reports tendency to internalize feelings and not having emotional reactions she thinks she should have. [name] AduSher reports sometimes feeling detached. She reports multiple stressors including caretaker responsibilities for her 38-year-old daughter who has autism, husband's infidelity 2 years ago, assisting 38 year old brother who has sickle cell disease, assisting family members (brother, aunt, mothler-in-law) with appointments. She reports job stress due to excessive caseload. Patient reports little to no time for self, she always has something to do. Patient's current symptoms include depressed mood, nervousness, muscle tension, and mood swings.  Patient reports she and her husband have remained separated since last session. She reports enjoying having space and being able to pursue her interests. She has begun taking Prozac and states feeling a little calmer. However, she is experiencing nausea related to medication and will discuss with pharmacist and/or PCP. She reports increased stress related to 38 year old daughter's issues with sexual identity as she does not know how to react to daughter but wants to be supportive. Patient continues to experience  significant anxiety and has difficulty relaxing. She admits she hasn't been practicing controlled breathing consistently.   Suicidal/Homicidal: No.   Therapist Response: Reviewed symptoms, facilitated expression of feelings,reviewd rationale for and practice controlled breathing  Plan: Return again in 2 weeks. Patient agrees to practice controlled breathing 5-10 minutes 2 times per day.  Diagnosis: Axis I: Depressive Disorder NOS and Generalized Anxiety Disorder    Axis II: Deferred    [name] Moffet, LCSW 11/10/2015

## 2015-11-19 ENCOUNTER — Encounter: Payer: Self-pay | Admitting: Family Medicine

## 2015-11-19 ENCOUNTER — Ambulatory Visit: Payer: Self-pay | Admitting: Family Medicine

## 2015-11-21 MED FILL — LO LOESTRIN FE 1-10 TABLET: 1 MG-10 MCG | 84 days supply | Qty: 84 | Fill #2

## 2015-11-24 ENCOUNTER — Ambulatory Visit (HOSPITAL_COMMUNITY): Payer: Self-pay | Admitting: Psychiatry

## 2016-02-12 MED FILL — LO LOESTRIN FE 1-10 TABLET: 1 MG-10 MCG | 84 days supply | Qty: 84 | Fill #3

## 2016-03-11 ENCOUNTER — Encounter: Payer: Self-pay | Admitting: Obstetrics and Gynecology

## 2016-03-11 ENCOUNTER — Ambulatory Visit (INDEPENDENT_AMBULATORY_CARE_PROVIDER_SITE_OTHER): Payer: 59 | Admitting: Obstetrics and Gynecology

## 2016-03-11 VITALS — BP 144/80 | HR 100 | Ht 59.0 in | Wt 127.6 lb

## 2016-03-11 DIAGNOSIS — Z113 Encounter for screening for infections with a predominantly sexual mode of transmission: Secondary | ICD-10-CM | POA: Diagnosis not present

## 2016-03-11 DIAGNOSIS — N898 Other specified noninflammatory disorders of vagina: Secondary | ICD-10-CM | POA: Diagnosis not present

## 2016-03-11 DIAGNOSIS — B009 Herpesviral infection, unspecified: Secondary | ICD-10-CM | POA: Diagnosis not present

## 2016-03-11 MED ORDER — VALACYCLOVIR HCL 1 G PO TABS: 1000.0000 mg | ORAL_TABLET | Freq: Every day | ORAL | 2 refills | Status: DC

## 2016-03-11 MED ORDER — HYDROCODONE-ACETAMINOPHEN 5-325 MG PO TABS: 1.0000 | ORAL_TABLET | Freq: Four times a day (QID) | ORAL | 0 refills | Status: DC | PRN

## 2016-03-11 MED ORDER — HYDROCORTISONE ACE-PRAMOXINE 1-1 % RE CREA: TOPICAL_CREAM | Freq: Three times a day (TID) | RECTAL | Status: AC

## 2016-03-11 MED FILL — valACYclovir HCL 1 GM TABS: 1 | 5 days supply | Qty: 5 | Fill #0

## 2016-03-11 NOTE — Progress Notes (Signed)
[facility] Visit  03/11/16           Patient name: [name] Fernandez MRN [id number]  Date of birth: 1976/04/17  CC & HPI:  [name] Fernandez is a 40 y.o. female presenting today for painful vaginal irritation onset 1 week. Pt states that she noticed the vaginal irritation following use of pads for her stress incontinence. Pt notes that she was unsure of whether her symptoms were due to yeast. Pt reports that she is not sure of what her last A1c was, but notes that she was recently evaluated for her diabetes. Pt states that she is recently divorced and that she is sexually active with a new partner that she doesn't use condoms with. Pt has associated symptoms of painful vulva blisters. Pt has tried Rx diflucan, AD ointment, neosporin, and hydrocortisone cream for the relief of her symptoms. Pt denies any other symptoms.   ROS:  Review of Systems  Genitourinary:       +Vaginal irritation +Vulva blisters     Pertinent History Reviewed:   Reviewed: Significant for DM, HTN, hyperlipidemia,  Medical         Past Medical History:  Diagnosis Date  . Anxiety   . Arthritis   . Depression   . Diabetes mellitus   . GERD (gastroesophageal reflux disease)   . Headache(784.0)   . Hyperlipidemia   . Hypertension                               Surgical Hx:    Past Surgical History:  Procedure Laterality Date  . BREAST SURGERY     right lumpectomy, benign  . CHOLECYSTECTOMY N/A 12/27/2013   Procedure: LAPAROSCOPIC CHOLECYSTECTOMY ;  Surgeon: Marlane HatcherWilliam S Bradford, MD;  Location: [facility];  Service: General;  Laterality: N/A;   Medications: Reviewed & Updated - see associated section                       Current Outpatient Prescriptions:  .  canagliflozin (INVOKANA) 300 MG TABS tablet, Take 1 tablet (300 mg total) by mouth daily., Disp: 30 tablet, Rfl: 0 .  fluconazole (DIFLUCAN) 150 MG tablet, One tablet once weekly, as needed, for vaginal itch, Disp: 4 tablet, Rfl: 0 .  FLUoxetine  (PROZAC) 20 MG tablet, Take 1 tablet (20 mg total) by mouth daily., Disp: 90 tablet, Rfl: 1 .  glipiZIDE (GLUCOTROL XL) 10 MG 24 hr tablet, Take 2 tablets (20 mg total) by mouth daily., Disp: 60 tablet, Rfl: 0 .  Insulin Glargine (LANTUS) 100 UNIT/ML Solostar Pen, Inject 15 Units into the skin daily at 10 pm., Disp: 15 mL, Rfl: 0 .  lisinopril (PRINIVIL,ZESTRIL) 20 MG tablet, Take 1 tablet (20 mg total) by mouth daily., Disp: 30 tablet, Rfl: 0 .  Norethindrone-Ethinyl Estradiol-Fe Biphas (LO LOESTRIN FE) 1 MG-10 MCG / 10 MCG tablet, Take 1 tablet by mouth daily., Disp: 3 Package, Rfl: 3 .  rosuvastatin (CRESTOR) 40 MG tablet, Take 1 tablet (40 mg total) by mouth daily., Disp: 30 tablet, Rfl: 0 .  sitaGLIPtin (JANUVIA) 100 MG tablet, Take 1 tablet (100 mg total) by mouth daily., Disp: 90 tablet, Rfl: 1 .  VITAMIN D, ERGOCALCIFEROL, PO, Take by mouth. Reported on 10/22/2015, Disp: , Rfl:  .  BIOTIN PO, Take 10,000 Units by mouth. Reported on 10/22/2015, Disp: , Rfl:    Social History: Reviewed -  reports that she has never smoked. She has never used smokeless tobacco.  Objective Findings:  Vitals: Blood pressure (!) 144/80, pulse 100, height 4\' 11"  (1.499 m), weight 127 lb 9.6 oz (57.9 kg), last menstrual period 03/03/2016.  Physical Examination:  General appearance - alert, well appearing, and in no distress and oriented to person, place, and time Mental status - alert, oriented to person, place, and time, normal mood, behavior, speech, dress, motor activity, and thought processes VULVA: Ulcerated lesion posterior fourchette, right and left labia majora, and peri-clitoral tissues.   Assessment & Plan:   A:  1. Primary herpes outbreak STI screening  P:  1. GC/Chlamydia  collected,. Also HIV RPR 2. Herpes viral culture probe 3. 1000 mg valtrex Rx x 5 days 4. Proctocream Rx 5. Vicodin Rx  By signing my name below, I, [name] Fernandez, attest that this documentation has been prepared under the  direction and in the presence of Tilda BurrowJohn V Sacred Roa, MD. Electronically Signed: Soijett Fernandez, ED Scribe. 03/11/16. 10:46 AM.  I personally performed the services described in this documentation, which was SCRIBED in my presence. The recorded information has been reviewed and considered accurate. It has been edited as necessary during review. Tilda BurrowFERGUSON,Adelena Desantiago V, MD \

## 2016-03-14 LAB — GC/CHLAMYDIA PROBE AMP
Chlamydia trachomatis, NAA: POSITIVE — AB
Neisseria gonorrhoeae by PCR: NEGATIVE

## 2016-03-14 LAB — HERPES SIMPLEX VIRUS CULTURE

## 2016-03-15 ENCOUNTER — Encounter: Payer: Self-pay | Admitting: Obstetrics and Gynecology

## 2016-03-15 ENCOUNTER — Telehealth: Payer: Self-pay | Admitting: Obstetrics and Gynecology

## 2016-03-15 DIAGNOSIS — A749 Chlamydial infection, unspecified: Secondary | ICD-10-CM

## 2016-03-15 HISTORY — DX: Chlamydial infection, unspecified: A74.9

## 2016-03-15 MED ORDER — AZITHROMYCIN 500 MG PO TABS: ORAL_TABLET | ORAL | 0 refills | Status: DC

## 2016-03-15 NOTE — Telephone Encounter (Signed)
rx'd azithromycin 500 mg 2 po now. partner to clinic, no sex, and get POC

## 2016-03-15 NOTE — Telephone Encounter (Signed)
Pt informed of +HSV 1 and +CHL from  03/11/2016. Pt informed no sex x 4 weeks for POC (POC appt scheduled 04/09/2016). Pt states she is already taking Valtrex for HSV.

## 2016-03-16 ENCOUNTER — Telehealth: Payer: Self-pay | Admitting: *Deleted

## 2016-03-16 ENCOUNTER — Ambulatory Visit: Payer: 59 | Admitting: Advanced Practice Midwife

## 2016-03-16 NOTE — Telephone Encounter (Signed)
Pt requesting a letter stating that she was diagnosed with HSV and Chlamydia on 03/11/2016 for her [facility]. Per Cyril MourningJennifer Griffin, can give a note stating pt was positive for CHL. Left the card Victorino DikeJennifer gave me at the front desk.

## 2016-03-17 ENCOUNTER — Other Ambulatory Visit: Payer: Self-pay | Admitting: Obstetrics and Gynecology

## 2016-03-17 MED ORDER — AZITHROMYCIN 500 MG PO TABS: 1000.0000 mg | ORAL_TABLET | Freq: Once | ORAL | 1 refills | Status: AC

## 2016-03-17 NOTE — Progress Notes (Signed)
Azithromycin called to pharmacy

## 2016-03-25 ENCOUNTER — Ambulatory Visit: Payer: 59 | Admitting: Obstetrics and Gynecology

## 2016-03-29 NOTE — Progress Notes (Signed)
Will ask [name] Fernandez to contact pt

## 2016-04-09 ENCOUNTER — Ambulatory Visit: Payer: Self-pay | Admitting: Obstetrics and Gynecology

## 2016-05-17 ENCOUNTER — Other Ambulatory Visit: Payer: Self-pay | Admitting: Women's Health

## 2016-06-04 ENCOUNTER — Ambulatory Visit (INDEPENDENT_AMBULATORY_CARE_PROVIDER_SITE_OTHER): Payer: 59 | Admitting: Family Medicine

## 2016-06-04 ENCOUNTER — Encounter: Payer: Self-pay | Admitting: Family Medicine

## 2016-06-04 VITALS — BP 140/84 | HR 99 | Resp 16 | Ht 59.0 in | Wt 128.1 lb

## 2016-06-04 DIAGNOSIS — F418 Other specified anxiety disorders: Secondary | ICD-10-CM

## 2016-06-04 DIAGNOSIS — N939 Abnormal uterine and vaginal bleeding, unspecified: Secondary | ICD-10-CM | POA: Diagnosis not present

## 2016-06-04 DIAGNOSIS — F32A Depression, unspecified: Secondary | ICD-10-CM

## 2016-06-04 DIAGNOSIS — E1165 Type 2 diabetes mellitus with hyperglycemia: Secondary | ICD-10-CM | POA: Diagnosis not present

## 2016-06-04 DIAGNOSIS — E559 Vitamin D deficiency, unspecified: Secondary | ICD-10-CM | POA: Diagnosis not present

## 2016-06-04 DIAGNOSIS — N926 Irregular menstruation, unspecified: Secondary | ICD-10-CM

## 2016-06-04 DIAGNOSIS — I10 Essential (primary) hypertension: Secondary | ICD-10-CM

## 2016-06-04 DIAGNOSIS — E785 Hyperlipidemia, unspecified: Secondary | ICD-10-CM

## 2016-06-04 DIAGNOSIS — L500 Allergic urticaria: Secondary | ICD-10-CM

## 2016-06-04 DIAGNOSIS — F329 Major depressive disorder, single episode, unspecified: Secondary | ICD-10-CM

## 2016-06-04 DIAGNOSIS — E1169 Type 2 diabetes mellitus with other specified complication: Secondary | ICD-10-CM | POA: Diagnosis not present

## 2016-06-04 DIAGNOSIS — F419 Anxiety disorder, unspecified: Secondary | ICD-10-CM

## 2016-06-04 LAB — POCT URINE PREGNANCY: PREG TEST UR: NEGATIVE

## 2016-06-04 NOTE — Patient Instructions (Signed)
F/u in 3  month, call if you need me sooner  Labs today quant hCG, HBA1C, chem 7 and eGFR, vit D  Contact office on Monday and look for my chart message for lab results and medicines you need to take

## 2016-06-05 ENCOUNTER — Encounter: Payer: Self-pay | Admitting: Family Medicine

## 2016-06-05 DIAGNOSIS — L500 Allergic urticaria: Secondary | ICD-10-CM | POA: Insufficient documentation

## 2016-06-05 DIAGNOSIS — N939 Abnormal uterine and vaginal bleeding, unspecified: Secondary | ICD-10-CM | POA: Insufficient documentation

## 2016-06-05 DIAGNOSIS — N926 Irregular menstruation, unspecified: Secondary | ICD-10-CM

## 2016-06-05 HISTORY — DX: Irregular menstruation, unspecified: N92.6

## 2016-06-05 HISTORY — DX: Abnormal uterine and vaginal bleeding, unspecified: N93.9

## 2016-06-05 LAB — COMPLETE METABOLIC PANEL WITH GFR
ALT: 19 U/L (ref 6–29)
AST: 18 U/L (ref 10–30)
Albumin: 3.5 g/dL — ABNORMAL LOW (ref 3.6–5.1)
Alkaline Phosphatase: 67 U/L (ref 33–115)
BUN: 7 mg/dL (ref 7–25)
CHLORIDE: 95 mmol/L — AB (ref 98–110)
CO2: 22 mmol/L (ref 20–31)
CREATININE: 0.69 mg/dL (ref 0.50–1.10)
Calcium: 9.1 mg/dL (ref 8.6–10.2)
GFR, Est Non African American: >89 mL/min (ref >=60)
GLUCOSE: 527 mg/dL — AB (ref 65–99)
Potassium: 4.3 mmol/L (ref 3.5–5.3)
SODIUM: 133 mmol/L — AB (ref 135–146)
Total Bilirubin: 0.5 mg/dL (ref 0.2–1.2)
Total Protein: 7.2 g/dL (ref 6.1–8.1)

## 2016-06-05 LAB — HEMOGLOBIN A1C
HEMOGLOBIN A1C: 12.1 % — AB (ref <5.7)
MEAN PLASMA GLUCOSE: 301 mg/dL

## 2016-06-05 LAB — HCG, QUANTITATIVE, PREGNANCY

## 2016-06-05 LAB — VITAMIN D 25 HYDROXY (VIT D DEFICIENCY, FRACTURES): Vit D, 25-Hydroxy: 16 ng/mL — ABNORMAL LOW (ref 30–100)

## 2016-06-05 MED ORDER — GLIPIZIDE ER 10 MG PO TB24: ORAL_TABLET | ORAL | 1 refills | Status: DC

## 2016-06-05 MED ORDER — CANAGLIFLOZIN 300 MG PO TABS: 300.0000 mg | ORAL_TABLET | Freq: Every day | ORAL | 1 refills | Status: DC

## 2016-06-05 MED ORDER — INSULIN GLARGINE 100 UNIT/ML SOLOSTAR PEN: 15.0000 [IU] | PEN_INJECTOR | Freq: Every day | SUBCUTANEOUS | 1 refills | Status: DC

## 2016-06-05 MED ORDER — NORETHIN-ETH ESTRAD-FE BIPHAS 1 MG-10 MCG / 10 MCG PO TABS: 1.0000 | ORAL_TABLET | Freq: Every day | ORAL | 3 refills | Status: DC

## 2016-06-05 MED ORDER — ERGOCALCIFEROL 1.25 MG (50000 UT) PO CAPS: 50000.0000 [IU] | ORAL_CAPSULE | ORAL | 1 refills | Status: DC

## 2016-06-05 MED ORDER — ROSUVASTATIN CALCIUM 40 MG PO TABS: 40.0000 mg | ORAL_TABLET | Freq: Every day | ORAL | 1 refills | Status: DC

## 2016-06-05 MED ORDER — SITAGLIPTIN PHOSPHATE 100 MG PO TABS: 100.0000 mg | ORAL_TABLET | Freq: Every day | ORAL | 1 refills | Status: DC

## 2016-06-05 MED ORDER — LISINOPRIL 20 MG PO TABS: 20.0000 mg | ORAL_TABLET | Freq: Every day | ORAL | 1 refills | Status: DC

## 2016-06-05 NOTE — Assessment & Plan Note (Signed)
24 hour h/o erythematous rash on left arm and left side of face, rcommend benadryl, oral and topical

## 2016-06-05 NOTE — Assessment & Plan Note (Signed)
Non compliant . Needs treatment

## 2016-06-05 NOTE — Assessment & Plan Note (Signed)
Uncontrolled DASH diet and commitment to daily physical activity for a minimum of 30 minutes discussed and encouraged, as a part of hypertension management. The importance of attaining a healthy weight is also discussed.  BP/Weight 06/04/2016 03/11/2016 10/22/2015 06/03/2015 08/28/2014 05/23/2014 02/06/2014  Systolic BP 140 144 142 136 124 122 148  Diastolic BP 84 80 74 70 72 84 86  Wt. (Lbs) 128.12 127.6 126 132 128.12 134.08 125.12  BMI 25.88 25.77 24.61 25.78 25.02 26.19 24.44

## 2016-06-05 NOTE — Assessment & Plan Note (Signed)
Deteriorated , medically non compliant, re educated re importance of changing behavior, will be contacted wit treatment plan result is available [name] is reminded of the importance of commitment to daily physical activity for 30 minutes or more, as able and the need to limit carbohydrate intake to 30 to 60 grams per meal to help with blood sugar control.   The need to take medication as prescribed, test blood sugar as directed, and to call between visits if there is a concern that blood sugar is uncontrolled is also discussed.   [name] is reminded of the importance of daily foot exam, annual eye examination, and good blood sugar, blood pressure and cholesterol control.  Diabetic Labs Latest Ref Rng & Units 06/04/2016 10/22/2015 08/27/2014 05/20/2014 02/06/2014  HbA1c <5.7 % 12.1(H) 13.3(H) 7.6(H) 8.4(H) -  Microalbumin Not estab mg/dL - 36.0(H) - - 1.6  Micro/Creat Ratio <30 mcg/mg creat - 254(H) - - 36.7(H)  Chol 125 - 200 mg/dL - 161(W277(H) [id number] 454(U286(H) -  HDL >=46 mg/dL - 98(J39(L) 19(J33(L) 47(W37(L) -  Calc LDL <130 mg/dL - 295(A199(H) 213(Y101(H) 865(H212(H) -  Triglycerides <150 mg/dL - 846(N195(H) 84 629(B183(H) -  Creatinine 0.50 - 1.10 mg/dL 2.840.69 1.320.59 4.400.59 1.020.65 -   BP/Weight 06/04/2016 03/11/2016 10/22/2015 06/03/2015 08/28/2014 05/23/2014 02/06/2014  Systolic BP 140 144 142 136 124 122 148  Diastolic BP 84 80 74 70 72 84 86  Wt. (Lbs) 128.12 127.6 126 132 128.12 134.08 125.12  BMI 25.88 25.77 24.61 25.78 25.02 26.19 24.44   Foot/eye exam completion dates Latest Ref Rng & Units 10/22/2015 03/03/2015  Eye Exam No Retinopathy - No Retinopathy  Foot Form Completion - Done -

## 2016-06-05 NOTE — Assessment & Plan Note (Signed)
Updated lab needed at/ before next visit. Resume crestor

## 2016-06-05 NOTE — Progress Notes (Signed)
[name] Fernandez     MRN: [id number]      DOB: [birth year]   HPI [name] Fernandez is here for follow up and re-evaluation of chronic medical conditions, medication management and review of any available recent lab and radiology data.  Preventive health is updated, specifically  Cancer screening and Immunization.  [name] Fernandez has been non compliant with her chronic medications and states that she knows that her blood sugar is running too high. C/o dry mouth and thirst. In a new relationship, and needs contraception, LMP approx 2 weeks ago A lot of personal and financial stress but feels loved and in a "good place" as far as her relationship is concerned 2 day h/o red rash on left forearm and left side of face , states she ate out last night and may have eaten something she might be allergic to. Denies difficulty breathing or swallowing  ROS Denies recent fever or chills. Denies sinus pressure, nasal congestion, ear pain or sore throat. Denies chest congestion, productive cough or wheezing. Denies chest pains, palpitations and leg swelling Denies abdominal pain, nausea, vomiting,diarrhea or constipation.   Denies dysuria, frequency, hesitancy or incontinence. Denies joint pain, swelling and limitation in mobility. Denies headaches, seizures, numbness, or tingling. Denies depression, anxiety or insomnia.  PE  BP 140/84   Pulse 99   Resp 16   Ht 4\' 11"  (1.499 m)   Wt 128 lb 1.9 oz (58.1 kg)   SpO2 100%   BMI 25.88 kg/m   Patient alert and oriented and in no cardiopulmonary distress.  HEENT: No facial asymmetry, EOMI,   oropharynx pink and moist.  Neck supple no JVD, no mass.  Chest: Clear to auscultation bilaterally.  CVS: S1, S2 no murmurs, no S3.Regular rate.  ABD: Soft non tender.   Ext: No edema  MS: Adequate ROM spine, shoulders, hips and knees.  Skin: Intact,uritcaria noted in left arm and left side of face lateral to eye  Psych: Good eye contact, normal affect. Memory  intact not anxious or depressed appearing.  CNS: CN 2-12 intact, power,  normal throughout.no focal deficits noted.   Assessment & Plan  DM (diabetes mellitus), type 2, uncontrolled Deteriorated , medically non compliant, re educated re importance of changing behavior, will be contacted wit treatment plan result is available [name] Fernandez is reminded of the importance of commitment to daily physical activity for 30 minutes or more, as able and the need to limit carbohydrate intake to 30 to 60 grams per meal to help with blood sugar control.   The need to take medication as prescribed, test blood sugar as directed, and to call between visits if there is a concern that blood sugar is uncontrolled is also discussed.   [name] Fernandez is reminded of the importance of daily foot exam, annual eye examination, and good blood sugar, blood pressure and cholesterol control.  Diabetic Labs Latest Ref Rng & Units 06/04/2016 10/22/2015 08/27/2014 05/20/2014 02/06/2014  HbA1c <5.7 % 12.1(H) 13.3(H) 7.6(H) 8.4(H) -  Microalbumin Not estab mg/dL - 36.0(H) - - 1.6  Micro/Creat Ratio <30 mcg/mg creat - 254(H) - - 36.7(H)  Chol 125 - 200 mg/dL - 409(W) 119 147(W) -  HDL >=46 mg/dL - 29(F) 62(Z) 30(Q) -  Calc LDL <130 mg/dL - 657(Q) 469(G) 295(M) -  Triglycerides <150 mg/dL - 841(L) 84 244(W) -  Creatinine 0.50 - 1.10 mg/dL 1.02 7.25 3.66 4.40 -   BP/Weight 06/04/2016 03/11/2016 10/22/2015 06/03/2015 08/28/2014 05/23/2014 02/06/2014  Systolic BP 140 144  142 136 124 122 148  Diastolic BP 84 80 74 70 72 84 86  Wt. (Lbs) 128.12 127.6 126 132 128.12 134.08 125.12  BMI 25.88 25.77 24.61 25.78 25.02 26.19 24.44   Foot/eye exam completion dates Latest Ref Rng & Units 10/22/2015 03/03/2015  Eye Exam No Retinopathy - No Retinopathy  Foot Form Completion - Done -        Anxiety and depression Improved, continue fluoxetine, pt to return to therapy  Essential hypertension Uncontrolled DASH diet and commitment to daily  physical activity for a minimum of 30 minutes discussed and encouraged, as a part of hypertension management. The importance of attaining a healthy weight is also discussed.  BP/Weight 06/04/2016 03/11/2016 10/22/2015 06/03/2015 08/28/2014 05/23/2014 02/06/2014  Systolic BP 140 144 142 136 124 122 148  Diastolic BP 84 80 74 70 72 84 86  Wt. (Lbs) 128.12 127.6 126 132 128.12 134.08 125.12  BMI 25.88 25.77 24.61 25.78 25.02 26.19 24.44       Hyperlipidemia LDL goal <100 Updated lab needed at/ before next visit. Resume crestor  Abnormal bleeding in menstrual cycle Abnormal bleeding with unprotected intercourse, urine pregnancy test negative, this is to be  Confirmed with blood test and she is to start contraception, not desirous of baby at this time but in the future, reportedly  Vitamin D deficiency Non compliant . Needs treatment  Allergic urticaria 24 hour h/o erythematous rash on left arm and left side of face, rcommend benadryl, oral and topical

## 2016-06-05 NOTE — Assessment & Plan Note (Addendum)
Abnormal bleeding with unprotected intercourse, urine pregnancy test negative, this is to be  Confirmed with blood test and she is to start contraception, not desirous of baby at this time but in the future, reportedly

## 2016-06-05 NOTE — Assessment & Plan Note (Signed)
Improved, continue fluoxetine, pt to return to therapy

## 2016-06-07 ENCOUNTER — Telehealth: Payer: Self-pay

## 2016-06-07 ENCOUNTER — Telehealth: Payer: Self-pay | Admitting: Family Medicine

## 2016-06-07 DIAGNOSIS — I10 Essential (primary) hypertension: Secondary | ICD-10-CM

## 2016-06-07 DIAGNOSIS — E1169 Type 2 diabetes mellitus with other specified complication: Secondary | ICD-10-CM

## 2016-06-07 DIAGNOSIS — E785 Hyperlipidemia, unspecified: Secondary | ICD-10-CM

## 2016-06-07 DIAGNOSIS — E1165 Type 2 diabetes mellitus with hyperglycemia: Secondary | ICD-10-CM

## 2016-06-07 MED ORDER — NORETHIN-ETH ESTRAD-FE BIPHAS 1 MG-10 MCG / 10 MCG PO TABS: 1.0000 | ORAL_TABLET | Freq: Every day | ORAL | 3 refills | Status: DC

## 2016-06-07 MED FILL — glipiZIDE XL 10 MG TB24: 10 | 90 days supply | Qty: 180 | Fill #0

## 2016-06-07 MED FILL — VIT D2 1.25 MG (50,000 UNIT: 1.25 MG | 84 days supply | Qty: 12 | Fill #0

## 2016-06-07 MED FILL — ROSUVASTATIN CALCIUM 40 MG: 40 | 90 days supply | Qty: 90 | Fill #0

## 2016-06-07 MED FILL — JANUVIA 100 MG TABLET: 100 | 30 days supply | Qty: 30 | Fill #0

## 2016-06-07 MED FILL — LISINOPRIL 20 MG TABLET: 20 | 90 days supply | Qty: 90 | Fill #0

## 2016-06-07 MED FILL — LANTUS SOLOSTAR 100 UNITS/M: 100 | 90 days supply | Qty: 15 | Fill #0

## 2016-06-07 NOTE — Telephone Encounter (Signed)
pls let pt know she will be started with only lantus, januvia and gipizide,take as directed and call back in 4 to 6 weeks if blood sugar still high, will try to [place] invokana then per ins coverage guidelines , this needs to be done. She DOES need to take the meds, follow diet and check sugar  ?? pls ask  Pls fax response fporm to pharmacy also re invokana, I have written

## 2016-06-07 NOTE — Telephone Encounter (Signed)
[name] Fernandez is calling asking about test results and possibly changing her medications if needed

## 2016-06-07 NOTE — Telephone Encounter (Signed)
Noted and patient aware. 

## 2016-06-07 NOTE — Telephone Encounter (Signed)
Also mailed to patient information on benefits offered through [employer] for those with diabetes

## 2016-06-08 MED FILL — LO LOESTRIN FE 1-10 TABLET: 1 MG-10 MCG | 84 days supply | Qty: 84 | Fill #0

## 2016-07-13 ENCOUNTER — Telehealth: Payer: Self-pay | Admitting: Obstetrics & Gynecology

## 2016-07-13 NOTE — Telephone Encounter (Signed)
Patient called stating she is having pregnancy symptoms but tests negative. She has not missed her period yet. I advised patient to do a pregnancy test by next weekend if her period did not start by then. Patient verbalized understanding.

## 2016-07-13 NOTE — Telephone Encounter (Signed)
Pt called stating that she would like a call back from the nurse, Pt did not leave the message with me. Please contact pt

## 2016-07-16 ENCOUNTER — Telehealth: Payer: Self-pay | Admitting: Family Medicine

## 2016-07-16 NOTE — Telephone Encounter (Signed)
Advised to get otc folic acid per pharmacist recommendation and call her obgyn if she gets a positive pregnancy test

## 2016-07-16 NOTE — Telephone Encounter (Signed)
[name] Fernandez is calling stating that since she is trying to get pregnant or may already be she is asking if Dr. Lodema HongSimpson would write for her to get some Folic Acid, Please advise?

## 2016-08-30 ENCOUNTER — Ambulatory Visit (INDEPENDENT_AMBULATORY_CARE_PROVIDER_SITE_OTHER): Payer: 59 | Admitting: Obstetrics & Gynecology

## 2016-08-30 ENCOUNTER — Encounter: Payer: Self-pay | Admitting: Obstetrics & Gynecology

## 2016-08-30 ENCOUNTER — Other Ambulatory Visit (HOSPITAL_COMMUNITY)
Admission: RE | Admit: 2016-08-30 | Discharge: 2016-08-30 | Disposition: A | Discharge status: Home / Self Care | Payer: 59 | Source: Ambulatory Visit | Attending: Obstetrics & Gynecology | Admitting: Obstetrics & Gynecology

## 2016-08-30 VITALS — BP 122/78 | HR 84 | Ht 61.0 in | Wt 135.0 lb

## 2016-08-30 DIAGNOSIS — Z01419 Encounter for gynecological examination (general) (routine) without abnormal findings: Secondary | ICD-10-CM | POA: Diagnosis not present

## 2016-08-30 DIAGNOSIS — Z3202 Encounter for pregnancy test, result negative: Secondary | ICD-10-CM | POA: Diagnosis not present

## 2016-08-30 LAB — POCT URINE PREGNANCY: Preg Test, Ur: NEGATIVE

## 2016-08-30 NOTE — Progress Notes (Signed)
Subjective:     [name] Fernandez is a 41 y.o. female here for a routine exam.  Patient's last menstrual period was 07/25/2016 (exact date). [id number] Birth Control Method:  None just off OCP Menstrual Calendar(currently): as above  Current complaints: none.   Current acute medical issues:  Diabetes, currently poorly controlled   Recent Gynecologic History Patient's last menstrual period was 07/25/2016 (exact date). Last Pap: 2018,  normal Last mammogram: ,    Past Medical History:  Diagnosis Date  . Anxiety   . Arthritis   . Chlamydia infection 03/15/2016  . Depression   . Diabetes mellitus   . GERD (gastroesophageal reflux disease)   . Headache(784.0)   . Hyperlipidemia   . Hypertension     Past Surgical History:  Procedure Laterality Date  . BREAST SURGERY     right lumpectomy, benign  . CHOLECYSTECTOMY N/A 12/27/2013   Procedure: LAPAROSCOPIC CHOLECYSTECTOMY ;  Surgeon: Marlane Hatcher, MD;  Location: [facility];  Service: General;  Laterality: N/A;    OB History    Gravida Para Term Preterm AB Living   SAB TAB Ectopic Multiple Live Births   1              Social History   Social History  . Marital status: Married    Spouse name: N/A  . Number of children: N/A  . Years of education: N/A   Social History Main Topics  . Smoking status: Never Smoker  . Smokeless tobacco: Never Used  . Alcohol use 0.0 oz/week     Comment: occ. wine a glass  . Drug use: No  . Sexual activity: Yes    Birth control/ protection: None   Other Topics Concern  . None   Social History Narrative  . None    Family History  Problem Relation Age of Onset  . Depression Mother   . Alcohol abuse Mother   . Depression Brother   . Sickle cell anemia Brother   . Hypertension Brother   . Diabetes Father   . COPD Father   . Anxiety disorder Maternal Aunt      Current Outpatient Prescriptions:  .  BIOTIN PO, Take 5,000 Units by mouth. Reported on 10/22/2015, Disp: ,  Rfl:  .  canagliflozin (INVOKANA) 300 MG TABS tablet, Take 1 tablet (300 mg total) by mouth daily before breakfast., Disp: 90 tablet, Rfl: 1 .  ergocalciferol (VITAMIN D2) 50000 units capsule, Take 1 capsule (50,000 Units total) by mouth once a week. One capsule once weekly, Disp: 12 capsule, Rfl: 1 .  FLUoxetine (PROZAC) 20 MG tablet, Take 1 tablet (20 mg total) by mouth daily., Disp: 90 tablet, Rfl: 1 .  glipiZIDE (GLUCOTROL XL) 10 MG 24 hr tablet, Take two tablets by mouth daily with breakfast, Disp: 180 tablet, Rfl: 1 .  Insulin Glargine (LANTUS) 100 UNIT/ML Solostar Pen, Inject 15 Units into the skin daily at 10 pm., Disp: 15 mL, Rfl: 1 .  lisinopril (PRINIVIL,ZESTRIL) 20 MG tablet, Take 1 tablet (20 mg total) by mouth daily., Disp: 90 tablet, Rfl: 1 .  rosuvastatin (CRESTOR) 40 MG tablet, Take 1 tablet (40 mg total) by mouth daily., Disp: 90 tablet, Rfl: 1 .  sitaGLIPtin (JANUVIA) 100 MG tablet, Take 1 tablet (100 mg total) by mouth daily., Disp: 90 tablet, Rfl: 1 .  LO LOESTRIN FE 1 MG-10 MCG / 10 MCG tablet, TAKE 1 TABLET BY MOUTH DAILY (  Patient not taking: Reported on 08/30/2016), Disp: 1 Package, Rfl: 0 .  Norethindrone-Ethinyl Estradiol-Fe Biphas (LO LOESTRIN FE) 1 MG-10 MCG / 10 MCG tablet, Take 1 tablet by mouth daily. (Patient not taking: Reported on 08/30/2016), Disp: 3 Package, Rfl: 3  Current Facility-Administered Medications:  .  pramoxine-hydrocortisone (PROCTOCREAM-HC) rectal cream, , Rectal, TID, Tilda Burrow, MD  Review of Systems  Review of Systems  Constitutional: Negative for fever, chills, weight loss, malaise/fatigue and diaphoresis.  HENT: Negative for hearing loss, ear pain, nosebleeds, congestion, sore throat, neck pain, tinnitus and ear discharge.   Eyes: Negative for blurred vision, double vision, photophobia, pain, discharge and redness.  Respiratory: Negative for cough, hemoptysis, sputum production, shortness of breath, wheezing and stridor.   Cardiovascular:  Negative for chest pain, palpitations, orthopnea, claudication, leg swelling and PND.  Gastrointestinal: negative for abdominal pain. Negative for heartburn, nausea, vomiting, diarrhea, constipation, blood in stool and melena.  Genitourinary: Negative for dysuria, urgency, frequency, hematuria and flank pain.  Musculoskeletal: Negative for myalgias, back pain, joint pain and falls.  Skin: Negative for itching and rash.  Neurological: Negative for dizziness, tingling, tremors, sensory change, speech change, focal weakness, seizures, loss of consciousness, weakness and headaches.  Endo/Heme/Allergies: Negative for environmental allergies and polydipsia. Does not bruise/bleed easily.  Psychiatric/Behavioral: Negative for depression, suicidal ideas, hallucinations, memory loss and substance abuse. The patient is not nervous/anxious and does not have insomnia.        Objective:  Blood pressure 122/78, pulse 84, height  (1.549 m), weight 135 lb (61.2 kg), last menstrual period 07/25/2016.   Physical Exam  Vitals reviewed. Constitutional: She is oriented to person, place, and time. She appears well-developed and well-nourished.  HENT:  Head: Normocephalic and atraumatic.        Right Ear: External ear normal.  Left Ear: External ear normal.  Nose: Nose normal.  Mouth/Throat: Oropharynx is clear and moist.  Eyes: Conjunctivae and EOM are normal. Pupils are equal, round, and reactive to light. Right eye exhibits no discharge. Left eye exhibits no discharge. No scleral icterus.  Neck: Normal range of motion. Neck supple. No tracheal deviation present. No thyromegaly present.  Cardiovascular: Normal rate, regular rhythm, normal heart sounds and intact distal pulses.  Exam reveals no gallop and no friction rub.   No murmur heard. Respiratory: Effort normal and breath sounds normal. No respiratory distress. She has no wheezes. She has no rales. She exhibits no tenderness.  GI: Soft. Bowel sounds  are normal. She exhibits no distension and no mass. There is no tenderness. There is no rebound and no guarding.  Genitourinary:  Breasts no masses skin changes or nipple changes bilaterally      Vulva is normal without lesions Vagina is pink moist without discharge Cervix normal in appearance and pap is done Uterus is normal size shape and contour Adnexa is negative with normal sized ovaries   Musculoskeletal: Normal range of motion. She exhibits no edema and no tenderness.  Neurological: She is alert and oriented to person, place, and time. She has normal reflexes. She displays normal reflexes. No cranial nerve deficit. She exhibits normal muscle tone. Coordination normal.  Skin: Skin is warm and dry. No rash noted. No erythema. No pallor.  Psychiatric: She has a normal mood and affect. Her behavior is normal. Judgment and thought content normal.       Medications Ordered at today's visit: No orders of the defined types were placed in this encounter.   Other orders placed  at today's visit: Orders Placed This Encounter  Procedures  . POCT urine pregnancy      Assessment:    Healthy female exam.    Plan:    Contraception: none. Follow up in: 1 year. admonished to get her BS in much better shape, she states her A1C is about 13  Pt does want to get pregnant and wants to get pregnant in the next year or so     Return in about 1 year (around 08/30/2017) for yearly, with Dr Despina Hidden.

## 2016-08-31 LAB — CYTOLOGY - PAP
Chlamydia: NEGATIVE
Diagnosis: NEGATIVE
Neisseria Gonorrhea: NEGATIVE

## 2016-09-06 ENCOUNTER — Ambulatory Visit: Payer: Self-pay | Admitting: Family Medicine

## 2016-09-06 ENCOUNTER — Encounter: Payer: Self-pay | Admitting: Family Medicine

## 2016-09-14 ENCOUNTER — Ambulatory Visit (INDEPENDENT_AMBULATORY_CARE_PROVIDER_SITE_OTHER): Payer: 59

## 2016-09-14 DIAGNOSIS — Z111 Encounter for screening for respiratory tuberculosis: Secondary | ICD-10-CM | POA: Diagnosis not present

## 2016-09-16 LAB — TB SKIN TEST
Induration: 0 mm
TB Skin Test: NEGATIVE

## 2016-12-17 ENCOUNTER — Other Ambulatory Visit: Payer: Self-pay

## 2016-12-17 DIAGNOSIS — F419 Anxiety disorder, unspecified: Principal | ICD-10-CM

## 2016-12-17 DIAGNOSIS — F32A Depression, unspecified: Secondary | ICD-10-CM

## 2016-12-17 DIAGNOSIS — F329 Major depressive disorder, single episode, unspecified: Secondary | ICD-10-CM

## 2016-12-17 MED ORDER — FLUOXETINE HCL 20 MG PO TABS: 20.0000 mg | ORAL_TABLET | Freq: Every day | ORAL | 1 refills | Status: DC

## 2016-12-17 MED FILL — FLUoxetine HCL 20 MG TABS: 20 | 90 days supply | Qty: 90 | Fill #0

## 2016-12-23 DIAGNOSIS — H5213 Myopia, bilateral: Secondary | ICD-10-CM | POA: Diagnosis not present

## 2016-12-23 DIAGNOSIS — H52223 Regular astigmatism, bilateral: Secondary | ICD-10-CM | POA: Diagnosis not present

## 2016-12-23 DIAGNOSIS — E119 Type 2 diabetes mellitus without complications: Secondary | ICD-10-CM | POA: Diagnosis not present

## 2017-12-21 DIAGNOSIS — N12 Tubulo-interstitial nephritis, not specified as acute or chronic: Secondary | ICD-10-CM | POA: Diagnosis not present

## 2017-12-22 ENCOUNTER — Encounter (HOSPITAL_COMMUNITY): Payer: Self-pay | Admitting: Emergency Medicine

## 2017-12-22 ENCOUNTER — Other Ambulatory Visit: Payer: Self-pay

## 2017-12-22 ENCOUNTER — Emergency Department (HOSPITAL_COMMUNITY)
Admission: EM | Admit: 2017-12-22 | Discharge: 2017-12-22 | Disposition: A | Discharge status: Home / Self Care | Payer: BLUE CROSS/BLUE SHIELD | Attending: Emergency Medicine | Admitting: Emergency Medicine

## 2017-12-22 DIAGNOSIS — Z794 Long term (current) use of insulin: Secondary | ICD-10-CM | POA: Diagnosis not present

## 2017-12-22 DIAGNOSIS — N12 Tubulo-interstitial nephritis, not specified as acute or chronic: Secondary | ICD-10-CM

## 2017-12-22 DIAGNOSIS — Z9114 Patient's other noncompliance with medication regimen: Secondary | ICD-10-CM | POA: Diagnosis not present

## 2017-12-22 DIAGNOSIS — Z79899 Other long term (current) drug therapy: Secondary | ICD-10-CM | POA: Diagnosis not present

## 2017-12-22 DIAGNOSIS — E119 Type 2 diabetes mellitus without complications: Secondary | ICD-10-CM | POA: Diagnosis not present

## 2017-12-22 DIAGNOSIS — I10 Essential (primary) hypertension: Secondary | ICD-10-CM | POA: Insufficient documentation

## 2017-12-22 DIAGNOSIS — R350 Frequency of micturition: Secondary | ICD-10-CM | POA: Diagnosis present

## 2017-12-22 LAB — URINALYSIS, ROUTINE W REFLEX MICROSCOPIC
Bilirubin Urine: NEGATIVE
KETONES UR: 5 mg/dL — AB
NITRITE: NEGATIVE
PH: 5 (ref 5.0–8.0)
PROTEIN: 100 mg/dL — AB
Specific Gravity, Urine: 1.015 (ref 1.005–1.030)

## 2017-12-22 LAB — CBC WITH DIFFERENTIAL/PLATELET
BASOS ABS: 0 10*3/uL (ref 0.0–0.1)
BASOS PCT: 0 %
EOS ABS: 0.1 10*3/uL (ref 0.0–0.7)
Eosinophils Relative: 1 %
HCT: 35.7 % — ABNORMAL LOW (ref 36.0–46.0)
HEMOGLOBIN: 12.2 g/dL (ref 12.0–15.0)
Lymphocytes Relative: 15 %
Lymphs Abs: 1.7 10*3/uL (ref 0.7–4.0)
MCH: 27.5 pg (ref 26.0–34.0)
MCHC: 34.2 g/dL (ref 30.0–36.0)
MCV: 80.4 fL (ref 78.0–100.0)
Monocytes Absolute: 1.2 10*3/uL — ABNORMAL HIGH (ref 0.1–1.0)
Monocytes Relative: 11 %
NEUTROS PCT: 73 %
Neutro Abs: 8.2 10*3/uL — ABNORMAL HIGH (ref 1.7–7.7)
PLATELETS: 272 10*3/uL (ref 150–400)
RBC: 4.44 MIL/uL (ref 3.87–5.11)
RDW: 13.4 % (ref 11.5–15.5)
WBC: 11.3 10*3/uL — AB (ref 4.0–10.5)

## 2017-12-22 LAB — BASIC METABOLIC PANEL
Anion gap: 9 (ref 5–15)
BUN: 9 mg/dL (ref 6–20)
CHLORIDE: 101 mmol/L (ref 98–111)
CO2: 26 mmol/L (ref 22–32)
Calcium: 9 mg/dL (ref 8.9–10.3)
Creatinine, Ser: 0.75 mg/dL (ref 0.44–1.00)
Glucose, Bld: 274 mg/dL — ABNORMAL HIGH (ref 70–99)
POTASSIUM: 3.3 mmol/L — AB (ref 3.5–5.1)
SODIUM: 136 mmol/L (ref 135–145)

## 2017-12-22 LAB — CBG MONITORING, ED: GLUCOSE-CAPILLARY: 286 mg/dL — AB (ref 70–99)

## 2017-12-22 MED ORDER — CEPHALEXIN 500 MG PO CAPS: 500.0000 mg | ORAL_CAPSULE | Freq: Three times a day (TID) | ORAL | 0 refills | Status: DC

## 2017-12-22 MED ORDER — CANAGLIFLOZIN 300 MG PO TABS: 300.0000 mg | ORAL_TABLET | Freq: Every day | ORAL | 0 refills | Status: DC

## 2017-12-22 MED ORDER — ONDANSETRON HCL 4 MG PO TABS: 4.0000 mg | ORAL_TABLET | Freq: Three times a day (TID) | ORAL | 0 refills | Status: DC | PRN

## 2017-12-22 MED ORDER — ONDANSETRON 8 MG PO TBDP
8.0000 mg | ORAL_TABLET | Freq: Once | ORAL | Status: AC
Start: 2017-12-22 — End: 2017-12-22
  Administered 2017-12-22: 8 mg via ORAL
  Filled 2017-12-22: qty 1

## 2017-12-22 MED ORDER — LISINOPRIL 20 MG PO TABS: 20.0000 mg | ORAL_TABLET | Freq: Every day | ORAL | 0 refills | Status: DC

## 2017-12-22 MED ORDER — CEPHALEXIN 500 MG PO CAPS
500.0000 mg | ORAL_CAPSULE | Freq: Once | ORAL | Status: AC
  Administered 2017-12-22: 500 mg via ORAL
  Filled 2017-12-22: qty 1

## 2017-12-22 MED ORDER — GLIPIZIDE ER 10 MG PO TB24: ORAL_TABLET | ORAL | 0 refills | Status: DC

## 2017-12-22 NOTE — ED Triage Notes (Signed)
Pt c/o lower back pain and bladder pressure for a few days.

## 2017-12-22 NOTE — Discharge Instructions (Addendum)
Take the antibiotic until gone. Restart your diabetes and blood pressure medication. You can take the zofran for nausea. You can take ibuprofen 400 mg + acetaminophen 650 mg every 6 hrs for pain as needed. Return to the ED if you get a fever, have uncontrolled vomiting or the pain gets severe.

## 2017-12-22 NOTE — ED Provider Notes (Signed)
[facility] EMERGENCY DEPARTMENT Provider Note   CSN: [id number] Arrival date & time: 12/22/17  0017  Time seen 06:14 AM   History   Chief Complaint Chief Complaint  Patient presents with  . Back Pain    HPI [name] Fernandez is a 42 y.o. female.  HPI patient states for about a week she noticed her urine was smelling bad and was cloudy.  She was having urinary frequency.  She complains of suprapubic pressure with dysuria.  She started getting right flank pain yesterday morning and then she started getting left flank pain and states currently the left hurts more than the right.  She states there is a constant pain and then there are like some spasms that come and go.  She states August him she started feeling tired and having nausea.  She denies vomiting, hematuria fever, or chills.  She states she is never had these symptoms before and has never had a urinary tract infection before.  Patient states she ran out of all of her medications several months ago.  She states however she has been trying to watch what she eats.  She states she has insurance now for couple months and is going to go back to her primary care doctor.  PCP Kerri Perches, MD  Past Medical History:  Diagnosis Date  . Anxiety   . Arthritis   . Chlamydia infection 03/15/2016  . Depression   . Diabetes mellitus   . GERD (gastroesophageal reflux disease)   . Headache(784.0)   . Hyperlipidemia   . Hypertension     Patient Active Problem List   Diagnosis Date Noted  . Abnormal bleeding in menstrual cycle 06/05/2016  . Allergic urticaria 06/05/2016  . Anxiety and depression 10/22/2015  . Generalized anxiety disorder 12/04/2013  . Vitamin D deficiency 06/18/2011  . Hyperlipidemia LDL goal <100 05/19/2008  . Essential hypertension 05/19/2008  . DM (diabetes mellitus), type 2, uncontrolled (HCC) 05/14/2008    Past Surgical History:  Procedure Laterality Date  . BREAST SURGERY     right lumpectomy, benign    . CHOLECYSTECTOMY N/A 12/27/2013   Procedure: LAPAROSCOPIC CHOLECYSTECTOMY ;  Surgeon: Marlane Hatcher, MD;  Location: [facility];  Service: General;  Laterality: N/A;     OB History    Gravida  4   Para  3   Term      Preterm      AB  1   Living        SAB  1   TAB      Ectopic      Multiple      Live Births               Home Medications    Prior to Admission medications   Medication Sig Start Date End Date Taking? Authorizing Provider  BIOTIN PO Take 5,000 Units by mouth. Reported on 10/22/2015    [provider]  canagliflozin (INVOKANA) 300 MG TABS tablet Take 1 tablet (300 mg total) by mouth daily before breakfast. 12/22/17   Devoria Albe, MD  cephALEXin (KEFLEX) 500 MG capsule Take 1 capsule (500 mg total) by mouth 3 (three) times daily. 12/22/17   Devoria Albe, MD  ergocalciferol (VITAMIN D2) 50000 units capsule Take 1 capsule (50,000 Units total) by mouth once a week. One capsule once weekly 06/05/16   Kerri Perches, MD  FLUoxetine (PROZAC) 20 MG tablet Take 1 tablet (20 mg total) by mouth daily. 12/17/16  Kerri Perches, MD  glipiZIDE (GLUCOTROL XL) 10 MG 24 hr tablet Take two tablets by mouth daily with breakfast 12/22/17   Devoria Albe, MD  Insulin Glargine (LANTUS) 100 UNIT/ML Solostar Pen Inject 15 Units into the skin daily at 10 pm. 06/05/16   Kerri Perches, MD  lisinopril (PRINIVIL,ZESTRIL) 20 MG tablet Take 1 tablet (20 mg total) by mouth daily. 12/22/17   Devoria Albe, MD  LO LOESTRIN FE 1 MG-10 MCG / 10 MCG tablet TAKE 1 TABLET BY MOUTH DAILY Patient not taking: Reported on 08/30/2016 05/17/16   Cheral Marker, CNM  Norethindrone-Ethinyl Estradiol-Fe Biphas (LO LOESTRIN FE) 1 MG-10 MCG / 10 MCG tablet Take 1 tablet by mouth daily. Patient not taking: Reported on 08/30/2016 06/07/16   Kerri Perches, MD  ondansetron (ZOFRAN) 4 MG tablet Take 1 tablet (4 mg total) by mouth every 8 (eight) hours as needed for nausea or vomiting. 12/22/17    Devoria Albe, MD  rosuvastatin (CRESTOR) 40 MG tablet Take 1 tablet (40 mg total) by mouth daily. 06/05/16   Kerri Perches, MD  sitaGLIPtin (JANUVIA) 100 MG tablet Take 1 tablet (100 mg total) by mouth daily. 06/05/16   Kerri Perches, MD    Family History Family History  Problem Relation Age of Onset  . Depression Mother   . Alcohol abuse Mother   . Depression Brother   . Sickle cell anemia Brother   . Hypertension Brother   . Diabetes Father   . COPD Father   . Anxiety disorder Maternal Aunt     Social History Social History   Tobacco Use  . Smoking status: Never Smoker  . Smokeless tobacco: Never Used  Substance Use Topics  . Alcohol use: Yes    Alcohol/week: 0.0 standard drinks    Comment: occ. wine a glass  . Drug use: No  employed   Allergies   Janumet [sitagliptin-metformin hcl]   Review of Systems Review of Systems  All other systems reviewed and are negative.    Physical Exam Updated Vital Signs BP (!) 157/101 (BP Location: Left Arm)   Pulse 87   Temp 98.3 F (36.8 C) (Oral)   Resp 12   Ht 5\' 2"  (1.575 m)   Wt 57.2 kg   SpO2 96%   BMI 23.05 kg/m   Physical Exam  Constitutional: She is oriented to person, place, and time. She appears well-developed and well-nourished.  Non-toxic appearance. She does not appear ill. No distress.  HENT:  Head: Normocephalic and atraumatic.  Right Ear: External ear normal.  Left Ear: External ear normal.  Nose: Nose normal. No mucosal edema or rhinorrhea.  Mouth/Throat: Oropharynx is clear and moist and mucous membranes are normal. No dental abscesses or uvula swelling.  Eyes: Pupils are equal, round, and reactive to light. Conjunctivae and EOM are normal.  Neck: Normal range of motion and full passive range of motion without pain. Neck supple.  Cardiovascular: Normal rate, regular rhythm and normal heart sounds. Exam reveals no gallop and no friction rub.  No murmur heard. Pulmonary/Chest: Effort  normal and breath sounds normal. No respiratory distress. She has no wheezes. She has no rhonchi. She has no rales. She exhibits no tenderness and no crepitus.  Abdominal: Soft. Normal appearance and bowel sounds are normal. She exhibits no distension. There is generalized tenderness. There is no rebound and no guarding.  Plus bilateral flank tenderness, left worse than the right.  Musculoskeletal: Normal range of motion. She exhibits  no edema or tenderness.  Moves all extremities well.   Neurological: She is alert and oriented to person, place, and time. She has normal strength. No cranial nerve deficit.  Skin: Skin is warm, dry and intact. No rash noted. No erythema. No pallor.  Psychiatric: She has a normal mood and affect. Her speech is normal and behavior is normal. Her mood appears not anxious.  Nursing note and vitals reviewed.    ED Treatments / Results  Labs (all labs ordered are listed, but only abnormal results are displayed) Results for orders placed or performed during the hospital encounter of 12/22/17  CBC with Differential  Result Value Ref Range   WBC 11.3 (H) 4.0 - 10.5 K/uL   RBC 4.44 3.87 - 5.11 MIL/uL   Hemoglobin 12.2 12.0 - 15.0 g/dL   HCT 29.535.7 (L) 62.136.0 - 30.846.0 %   MCV 80.4 78.0 - 100.0 fL   MCH 27.5 26.0 - 34.0 pg   MCHC 34.2 30.0 - 36.0 g/dL   RDW 65.713.4 84.611.5 - 96.215.5 %   Platelets 272 150 - 400 K/uL   Neutrophils Relative % 73 %   Neutro Abs 8.2 (H) 1.7 - 7.7 K/uL   Lymphocytes Relative 15 %   Lymphs Abs 1.7 0.7 - 4.0 K/uL   Monocytes Relative 11 %   Monocytes Absolute 1.2 (H) 0.1 - 1.0 K/uL   Eosinophils Relative 1 %   Eosinophils Absolute 0.1 0.0 - 0.7 K/uL   Basophils Relative 0 %   Basophils Absolute 0.0 0.0 - 0.1 K/uL  Basic metabolic panel  Result Value Ref Range   Sodium 136 135 - 145 mmol/L   Potassium 3.3 (L) 3.5 - 5.1 mmol/L   Chloride 101 98 - 111 mmol/L   CO2 26 22 - 32 mmol/L   Glucose, Bld 274 (H) 70 - 99 mg/dL   BUN 9 6 - 20 mg/dL    Creatinine, Ser 9.520.75 0.44 - 1.00 mg/dL   Calcium 9.0 8.9 - 84.110.3 mg/dL   GFR calc non Af Amer >60 >60 mL/min   GFR calc Af Amer >60 >60 mL/min   Anion gap 9 5 - 15  Urinalysis, Routine w reflex microscopic  Result Value Ref Range   Color, Urine YELLOW YELLOW   APPearance CLOUDY (A) CLEAR   Specific Gravity, Urine 1.015 1.005 - 1.030   pH 5.0 5.0 - 8.0   Glucose, UA >=500 (A) NEGATIVE mg/dL   Hgb urine dipstick LARGE (A) NEGATIVE   Bilirubin Urine NEGATIVE NEGATIVE   Ketones, ur 5 (A) NEGATIVE mg/dL   Protein, ur 324100 (A) NEGATIVE mg/dL   Nitrite NEGATIVE NEGATIVE   Leukocytes, UA MODERATE (A) NEGATIVE   RBC / HPF 21-50 0 - 5 RBC/hpf   WBC, UA 11-20 0 - 5 WBC/hpf   Bacteria, UA RARE (A) NONE SEEN   Squamous Epithelial / LPF 0-5 0 - 5   WBC Clumps PRESENT   CBG monitoring, ED  Result Value Ref Range   Glucose-Capillary 286 (H) 70 - 99 mg/dL   Laboratory interpretation all normal except mild leukocytosis, hyperglycemia, UTI, urine culture sent.    EKG None  Radiology No results found.  Procedures Procedures (including critical care time)  Medications Ordered in ED Medications  ondansetron (ZOFRAN-ODT) disintegrating tablet 8 mg (8 mg Oral Given 12/22/17 0630)  cephALEXin (KEFLEX) capsule 500 mg (500 mg Oral Given 12/22/17 0631)     Initial Impression / Assessment and Plan / ED Course  I have reviewed  the triage vital signs and the nursing notes.  Pertinent labs & imaging results that were available during my care of the patient were reviewed by me and considered in my medical decision making (see chart for details).     Patient was offered IV fluids and IV antibiotics however she stated she felt like she could try oral.  She was given Zofran ODT and started on oral Keflex.  Her urinalysis looks like a UTI and with her flank pain it is suggestive of pyelonephritis.  She however does not have a fever and she is not having vomiting so hopefully she can tolerate oral  antibiotics at home to recuperate.  She was advised however if she gets fever, vomiting, or she seems where she should return.  Patient was started back on some of her diabetic medication she been on before.  It appears that the last time she saw her primary care doctor was January 2018.  I would suspect she is been out of her medication for more than a few months.  Her blood sugar is not that high, it has been much higher with her prior ED visit.  I restarted her on some of her prior oral diabetic medications, I did not start her back on insulin.  She also was started back on the lisinopril for her hypertension.  She states she is going to follow-up with her primary care doctor soon.  Final Clinical Impressions(s) / ED Diagnoses   Final diagnoses:  Pyelonephritis    ED Discharge Orders         Ordered    ondansetron (ZOFRAN) 4 MG tablet  Every 8 hours PRN     12/22/17 0712    cephALEXin (KEFLEX) 500 MG capsule  3 times daily     12/22/17 0712    glipiZIDE (GLUCOTROL XL) 10 MG 24 hr tablet     12/22/17 0720    lisinopril (PRINIVIL,ZESTRIL) 20 MG tablet  Daily     12/22/17 0720    canagliflozin (INVOKANA) 300 MG TABS tablet  Daily before breakfast     12/22/17 0720        OTC ibuprofen and acetaminophen   Plan discharge  Devoria AlbeIva Caragh Gasper, MD, Concha PyoFACEP    Eddie Koc, MD 12/22/17 [id number]

## 2017-12-24 LAB — URINE CULTURE
Culture: >=100000 — AB
Special Requests: NORMAL

## 2017-12-25 ENCOUNTER — Telehealth: Payer: Self-pay

## 2017-12-25 NOTE — Telephone Encounter (Signed)
Post ED Visit - Positive Culture Follow-up  Culture report reviewed by antimicrobial stewardship pharmacist:  []  Enzo BiNathan Batchelder, Pharm.D. []  Celedonio MiyamotoJeremy Frens, [address]Ph[address] BCPS AQ-ID []  Garvin FilaMike Maccia, Pharm.D., BCPS []  Georgina PillionElizabeth Martin, Pharm.D., BCPS []  MontgomeryMinh Pham, [address]Ph[address]D., BCPS, AAHIVP []  Estella HuskMichelle Turner, Pharm.D., BCPS, AAHIVP []  Lysle Pearlachel Rumbarger, PharmD, BCPS []  Phillips Climeshuy Dang, PharmD, BCPS []  Agapito GamesAlison Masters, PharmD, BCPS []  Verlan FriendsErin Deja, PharmD [facility] Pharm D Positive urine culture Treated with Cephalexin, organism sensitive to the same and no further patient follow-up is required at this time.  Jerry CarasCullom, Trigo Winterbottom Burnett 12/25/2017, 11:50 AM

## 2018-01-16 ENCOUNTER — Encounter: Payer: Self-pay | Admitting: Family Medicine

## 2018-01-16 ENCOUNTER — Other Ambulatory Visit: Payer: Self-pay

## 2018-01-16 ENCOUNTER — Other Ambulatory Visit (HOSPITAL_COMMUNITY)
Admission: RE | Admit: 2018-01-16 | Discharge: 2018-01-16 | Disposition: A | Discharge status: Home / Self Care | Payer: BLUE CROSS/BLUE SHIELD | Source: Ambulatory Visit | Attending: Family Medicine | Admitting: Family Medicine

## 2018-01-16 ENCOUNTER — Ambulatory Visit (INDEPENDENT_AMBULATORY_CARE_PROVIDER_SITE_OTHER): Payer: BLUE CROSS/BLUE SHIELD | Admitting: Family Medicine

## 2018-01-16 VITALS — BP 180/100 | HR 90 | Resp 12 | Ht 60.0 in | Wt 124.1 lb

## 2018-01-16 DIAGNOSIS — I10 Essential (primary) hypertension: Secondary | ICD-10-CM | POA: Diagnosis not present

## 2018-01-16 DIAGNOSIS — N3001 Acute cystitis with hematuria: Secondary | ICD-10-CM

## 2018-01-16 DIAGNOSIS — R8290 Unspecified abnormal findings in urine: Secondary | ICD-10-CM | POA: Diagnosis not present

## 2018-01-16 DIAGNOSIS — R739 Hyperglycemia, unspecified: Secondary | ICD-10-CM

## 2018-01-16 DIAGNOSIS — Z23 Encounter for immunization: Secondary | ICD-10-CM

## 2018-01-16 DIAGNOSIS — E1165 Type 2 diabetes mellitus with hyperglycemia: Secondary | ICD-10-CM | POA: Diagnosis not present

## 2018-01-16 DIAGNOSIS — F419 Anxiety disorder, unspecified: Secondary | ICD-10-CM

## 2018-01-16 DIAGNOSIS — F329 Major depressive disorder, single episode, unspecified: Secondary | ICD-10-CM

## 2018-01-16 DIAGNOSIS — E559 Vitamin D deficiency, unspecified: Secondary | ICD-10-CM

## 2018-01-16 DIAGNOSIS — E785 Hyperlipidemia, unspecified: Secondary | ICD-10-CM | POA: Diagnosis not present

## 2018-01-16 DIAGNOSIS — F32A Depression, unspecified: Secondary | ICD-10-CM

## 2018-01-16 LAB — POCT URINALYSIS DIPSTICK
Bilirubin, UA: NEGATIVE
GLUCOSE UA: POSITIVE — AB
KETONES UA: NEGATIVE
Nitrite, UA: NEGATIVE
Protein, UA: POSITIVE — AB
SPEC GRAV UA: 1.02 (ref 1.010–1.025)
Urobilinogen, UA: 0.2 E.U./dL
pH, UA: 5.5 (ref 5.0–8.0)

## 2018-01-16 LAB — GLUCOSE, POCT (MANUAL RESULT ENTRY): POC GLUCOSE: 283 mg/dL — AB (ref 70–99)

## 2018-01-16 MED ORDER — FLUOXETINE HCL 20 MG PO TABS: 20.0000 mg | ORAL_TABLET | Freq: Every day | ORAL | 3 refills | Status: DC

## 2018-01-16 MED ORDER — CIPROFLOXACIN HCL 500 MG PO TABS: 500.0000 mg | ORAL_TABLET | Freq: Two times a day (BID) | ORAL | 0 refills | Status: DC

## 2018-01-16 MED ORDER — LISINOPRIL-HYDROCHLOROTHIAZIDE 20-12.5 MG PO TABS: 1.0000 | ORAL_TABLET | Freq: Every day | ORAL | 1 refills | Status: DC

## 2018-01-16 MED ORDER — GLIPIZIDE ER 10 MG PO TB24: ORAL_TABLET | ORAL | 5 refills | Status: DC

## 2018-01-16 MED ORDER — TRAZODONE HCL 50 MG PO TABS: 50.0000 mg | ORAL_TABLET | Freq: Every evening | ORAL | 3 refills | Status: DC | PRN

## 2018-01-16 NOTE — Patient Instructions (Addendum)
F/U in  5.5 weeks call if you need me before  Labs today HBa1C, cmp and EGFR, lipid, vit D, microalb , UA and C/S  Flu vaccine today  Start medication for blood pressure, blood sugar , 5 days of ciprofloxacin are prescribed and you are referred to psychiatry

## 2018-01-17 LAB — MICROALBUMIN / CREATININE URINE RATIO
CREATININE, UR: 59.1 mg/dL
MICROALB/CREAT RATIO: 178.3 mg/g{creat} — AB (ref 0.0–30.0)
Microalb, Ur: 105.4 ug/mL — ABNORMAL HIGH

## 2018-01-18 LAB — URINE CULTURE
MICRO NUMBER:: 91080938
SPECIMEN QUALITY: ADEQUATE

## 2018-01-19 LAB — URINE CULTURE: Culture: >=100000 — AB

## 2018-01-21 ENCOUNTER — Encounter: Payer: Self-pay | Admitting: Family Medicine

## 2018-01-21 DIAGNOSIS — N3001 Acute cystitis with hematuria: Secondary | ICD-10-CM | POA: Insufficient documentation

## 2018-01-21 NOTE — Assessment & Plan Note (Signed)
[name] is reminded of the importance of commitment to daily physical activity for 30 minutes or more, as able and the need to limit carbohydrate intake to 30 to 60 grams per meal to help with blood sugar control.   The need to take medication as prescribed, test blood sugar as directed, and to call between visits if there is a concern that blood sugar is uncontrolled is also discussed.   [name] is reminded of the importance of daily foot exam, annual eye examination, and good blood sugar, blood pressure and cholesterol control. Updated lab requested on day of visit , result still pending! Diabetic Labs Latest Ref Rng & Units 01/16/2018 12/22/2017 06/04/2016 10/22/2015 08/27/2014  HbA1c <5.7 % - - 12.1(H) 13.3(H) 7.6(H)  Microalbumin Not Estab. ug/mL 105.4(H) - - 36.0(H) -  Micro/Creat Ratio 0.0 - 30.0 mg/g creat 178.3(H) - - 254(H) -  Chol 125 - 200 mg/dL - - - 409(W277(H) 119151  HDL >=14>=46 mg/dL - - - 78(G39(L) 95(A33(L)  Calc LDL <130 mg/dL - - - 213(Y199(H) 865(H101(H)  Triglycerides <150 mg/dL - - - 846(N195(H) 84  Creatinine 0.44 - 1.00 mg/dL - 6.290.75 5.280.69 4.130.59 2.440.59   BP/Weight 01/16/2018 12/22/2017 08/30/2016 06/04/2016 03/11/2016 10/22/2015 06/03/2015  Systolic BP 180 157 122 140 144 142 136  Diastolic BP 100 101 78 84 80 74 70  Wt. (Lbs) 124.12 126 135 128.12 127.6 126 132  BMI 24.24 23.05 25.51 25.88 25.77 24.61 25.78   Foot/eye exam completion dates Latest Ref Rng & Units 10/22/2015 03/03/2015  Eye Exam No Retinopathy - No Retinopathy  Foot Form Completion - Done -

## 2018-01-21 NOTE — Assessment & Plan Note (Signed)
Updated lab needed at/ before next visit. Hyperlipidemia:Low fat diet discussed and encouraged.   Lipid Panel  Lab Results  Component Value Date   CHOL 277 (H) 10/22/2015   HDL 39 (L) 10/22/2015   LDLCALC 199 (H) 10/22/2015   TRIG 195 (H) 10/22/2015   CHOLHDL 7.1 (H) 10/22/2015  will need statin,lab requested on day of visit

## 2018-01-21 NOTE — Assessment & Plan Note (Signed)
Symptomatic with abn urine and recently treated for UTI , uncontrolled diabetic, send for c/s  And f/u

## 2018-01-21 NOTE — Assessment & Plan Note (Signed)
Resume medication and refer to Psychiary

## 2018-01-21 NOTE — Assessment & Plan Note (Signed)
Uncontrolled due o non compliance , prescription for medication to be resumed wit close follow up DASH diet and commitment to daily physical activity for a minimum of 30 minutes discussed and encouraged, as a part of hypertension management. The importance of attaining a healthy weight is also discussed.  BP/Weight 01/16/2018 12/22/2017 08/30/2016 06/04/2016 03/11/2016 10/22/2015 06/03/2015  Systolic BP 180 157 122 140 144 142 136  Diastolic BP 100 101 78 84 80 74 70  Wt. (Lbs) 124.12 126 135 128.12 127.6 126 132  BMI 24.24 23.05 25.51 25.88 25.77 24.61 25.78

## 2018-01-21 NOTE — Progress Notes (Signed)
[name] Fernandez     MRN: [id number]      DOB: 1976-02-27   HPI Ms. Advincula is here for follow up and re-evaluation of chronic medical conditions, medication management  She [facility] for pyelonephritis,  r which she believes that she still  Has. States she has been having a really stressful time, now a single mom, younger child is autistic, she is experiencing financial  Challenge, and now states she she realizes that she needs mental health treatment  . Though not suicidal or actively homicidal, she does struggle at times as to aq reason for her existence apart for being here for her children She has been on  No medication  ROS Denies recent fever or chills. Denies sinus pressure, nasal congestion, ear pain or sore throat. Denies chest congestion, productive cough or wheezing. Denies chest pains, palpitations and leg swelling Denies abdominal pain, nausea, vomiting,diarrhea or constipation.   . Denies joint pain, swelling and limitation in mobility. Denies headaches, seizures, numbness, or tingling. Denies depression, anxiety or insomnia. Denies skin break down or rash.   PE  BP (!) 180/100   Pulse 90   Resp 12   Ht 5' (1.524 m)   Wt 124 lb 1.9 oz (56.3 kg)   SpO2 99% Comment: room air  BMI 24.24 kg/m   Patient alert and oriented and in no cardiopulmonary distress.  HEENT: No facial asymmetry, EOMI,   oropharynx pink and moist.  Neck supple no JVD, no mass.  Chest: Clear to auscultation bilaterally.  CVS: S1, S2 no murmurs, no S3.Regular rate.  ABD: Soft no renal angle tenderness, she does have  suprapubic tenderness Ext: No edema  MS: Adequate ROM spine, shoulders, hips and knees.  Skin: Intact, no ulcerations or rash noted.  Psych: Good eye contact, normal affect. Memory intact anxious and depressed appearing.  CNS: CN 2-12 intact, power,  normal throughout.no focal deficits noted.   Assessment & Plan  Essential  hypertension Uncontrolled due o non compliance , prescription for medication to be resumed wit close follow up DASH diet and commitment to daily physical activity for a minimum of 30 minutes discussed and encouraged, as a part of hypertension management. The importance of attaining a healthy weight is also discussed.  BP/Weight 01/16/2018 12/22/2017 08/30/2016 06/04/2016 03/11/2016 10/22/2015 06/03/2015  Systolic BP 180 157 122 140 144 142 136  Diastolic BP 100 101 78 84 80 74 70  Wt. (Lbs) 124.12 126 135 128.12 127.6 126 132  BMI 24.24 23.05 25.51 25.88 25.77 24.61 25.78       Hyperlipidemia LDL goal <100 Updated lab needed at/ before next visit. Hyperlipidemia:Low fat diet discussed and encouraged.   Lipid Panel  Lab Results  Component Value Date   CHOL 277 (H) 10/22/2015   HDL 39 (L) 10/22/2015   LDLCALC 199 (H) 10/22/2015   TRIG 195 (H) 10/22/2015   CHOLHDL 7.1 (H) 10/22/2015  will need statin,lab requested on day of visit   DM (diabetes mellitus), type 2, uncontrolled Ms. Ang is reminded of the importance of commitment to daily physical activity for 30 minutes or more, as able and the need to limit carbohydrate intake to 30 to 60 grams per meal to help with blood sugar control.   The need to take medication as prescribed, test blood sugar as directed, and to call between visits if there is a concern that blood sugar is uncontrolled is also discussed.   Ms. Penza is  reminded of the importance of daily foot exam, annual eye examination, and good blood sugar, blood pressure and cholesterol control. Updated lab requested on day of visit , result still pending! Diabetic Labs Latest Ref Rng & Units 01/16/2018 12/22/2017 06/04/2016 10/22/2015 08/27/2014  HbA1c <5.7 % - - 12.1(H) 13.3(H) 7.6(H)  Microalbumin Not Estab. ug/mL 105.4(H) - - 36.0(H) -  Micro/Creat Ratio 0.0 - 30.0 mg/g creat 178.3(H) - - 254(H) -  Chol 125 - 200 mg/dL - - - 119(J277(H) 478151  HDL >=29>=46 mg/dL - - - 56(O39(L) 13(Y33(L)   Calc LDL <130 mg/dL - - - 865(H199(H) 846(N101(H)  Triglycerides <150 mg/dL - - - 629(B195(H) 84  Creatinine 0.44 - 1.00 mg/dL - 2.840.75 1.320.69 4.400.59 1.020.59   BP/Weight 01/16/2018 12/22/2017 08/30/2016 06/04/2016 03/11/2016 10/22/2015 06/03/2015  Systolic BP 180 157 122 140 144 142 136  Diastolic BP 100 101 78 84 80 74 70  Wt. (Lbs) 124.12 126 135 128.12 127.6 126 132  BMI 24.24 23.05 25.51 25.88 25.77 24.61 25.78   Foot/eye exam completion dates Latest Ref Rng & Units 10/22/2015 03/03/2015  Eye Exam No Retinopathy - No Retinopathy  Foot Form Completion - Done -        Anxiety and depression Resume medication and refer to Psychiary  Acute cystitis with hematuria Symptomatic with abn urine and recently treated for UTI , uncontrolled diabetic, send for c/s  And f/u  '

## 2018-03-01 ENCOUNTER — Ambulatory Visit: Payer: Self-pay | Admitting: Family Medicine

## 2018-03-29 ENCOUNTER — Ambulatory Visit: Payer: Self-pay | Admitting: Family Medicine

## 2018-07-25 ENCOUNTER — Telehealth: Payer: Self-pay | Admitting: Family Medicine

## 2018-07-25 NOTE — Telephone Encounter (Signed)
Patient is late starting her cycle. She just recently started a new job and is trying to work with insurance. She would rather have a blood pregnancy test and not a urine pregnancy test.

## 2018-07-25 NOTE — Telephone Encounter (Signed)
Pt is wanting a blood test for pregnancy, not a urine  Please ask and call the pt back at [phone]

## 2018-07-25 NOTE — Telephone Encounter (Signed)
pls order quant HCG

## 2018-07-26 ENCOUNTER — Telehealth: Payer: Self-pay

## 2018-07-26 DIAGNOSIS — N912 Amenorrhea, unspecified: Secondary | ICD-10-CM

## 2018-07-26 NOTE — Telephone Encounter (Signed)
Pregnancy test ordered per MD. Patient notified

## 2018-07-26 NOTE — Telephone Encounter (Signed)
Test ordered. Patient notified.

## 2018-07-27 ENCOUNTER — Encounter: Payer: Self-pay | Admitting: Family Medicine

## 2018-07-27 LAB — HCG, QUANTITATIVE, PREGNANCY: HCG, Total, QN: <2 m[IU]/mL

## 2018-09-25 ENCOUNTER — Ambulatory Visit: Payer: BLUE CROSS/BLUE SHIELD | Admitting: Obstetrics & Gynecology

## 2018-10-11 ENCOUNTER — Encounter: Payer: Self-pay | Admitting: Adult Health

## 2018-10-11 ENCOUNTER — Ambulatory Visit (INDEPENDENT_AMBULATORY_CARE_PROVIDER_SITE_OTHER): Payer: Self-pay | Admitting: Adult Health

## 2018-10-11 ENCOUNTER — Other Ambulatory Visit: Payer: Self-pay

## 2018-10-11 VITALS — Ht <= 58 in | Wt 121.0 lb

## 2018-10-11 DIAGNOSIS — Z3201 Encounter for pregnancy test, result positive: Secondary | ICD-10-CM | POA: Insufficient documentation

## 2018-10-11 DIAGNOSIS — O10919 Unspecified pre-existing hypertension complicating pregnancy, unspecified trimester: Secondary | ICD-10-CM

## 2018-10-11 DIAGNOSIS — Z3A12 12 weeks gestation of pregnancy: Secondary | ICD-10-CM

## 2018-10-11 DIAGNOSIS — O09521 Supervision of elderly multigravida, first trimester: Secondary | ICD-10-CM

## 2018-10-11 DIAGNOSIS — E119 Type 2 diabetes mellitus without complications: Secondary | ICD-10-CM

## 2018-10-11 DIAGNOSIS — O3680X0 Pregnancy with inconclusive fetal viability, not applicable or unspecified: Secondary | ICD-10-CM

## 2018-10-11 HISTORY — DX: Unspecified pre-existing hypertension complicating pregnancy, unspecified trimester: O10.919

## 2018-10-11 HISTORY — DX: Supervision of elderly multigravida, first trimester: O09.521

## 2018-10-11 NOTE — Progress Notes (Signed)
Patient ID: [name] Fernandez, female   DOB: 04-12-1976, 43 y.o.   MRN: [id number]   TELEHEALTH VIRTUAL GYNECOLOGY VISIT ENCOUNTER NOTE  I connected with [name] Fernandez on 10/11/18 at  3:00 PM EDT by telephone at home and verified that I am speaking with the correct person using two identifiers.   I discussed the limitations, risks, security and privacy concerns of performing an evaluation and management service by telephone and the availability of in person appointments. I also discussed with the patient that there may be a patient responsible charge related to this service. The patient expressed understanding and agreed to proceed.   History:  [name] Fernandez is a 43 y.o. [id number] female being evaluated today for having missed several periods and had some spotting, none now, and has had 5+HPTs, she is about 12+ 4 weeks by LMP with EDD 04/21/19. She denies any abnormal vaginal discharge, bleeding, pelvic pain or other concerns.   PCP is Dr Lodema Hong and she has not seen her lately and has meds for hypertension and diabetes but has not taken regularly, due to insurance issues.      Past Medical History:  Diagnosis Date  . Anxiety   . Arthritis   . Chlamydia infection 03/15/2016  . Depression   . Diabetes mellitus   . GERD (gastroesophageal reflux disease)   . Headache(784.0)   . Hyperlipidemia   . Hypertension    Past Surgical History:  Procedure Laterality Date  . BREAST SURGERY     right lumpectomy, benign  . CHOLECYSTECTOMY N/A 12/27/2013   Procedure: LAPAROSCOPIC CHOLECYSTECTOMY ;  Surgeon: Marlane Hatcher, MD;  Location: [facility];  Service: General;  Laterality: N/A;   The following portions of the patient's history were reviewed and updated as appropriate: allergies, current medications, past family history, past medical history, past social history, past surgical history and problem list.   Health Maintenance:  Normal pap on 08/30/16.   Review of Systems:  Pertinent items  noted in HPI and remainder of comprehensive ROS otherwise negative.  Physical Exam:   General:  Alert, oriented and cooperative.   Mental Status: Normal mood and affect perceived. Normal judgment and thought content.  Physical exam deferred due to nature of the encounter Ht 4\' 9"  (1.448 m)   Wt 121 lb (54.9 kg)   LMP 07/25/2018 (LMP Unknown)   BMI 26.18 kg/m per pt. Fall risk is moderate.  Labs and Imaging No results found for this or any previous visit (from the past 336 hour(s)). No results found.    Assessment and Plan:     1. Positive pregnancy test +HPTs x 5   2. Encounter to determine fetal viability of pregnancy, single or unspecified fetus -will get dating [place] in 1 week, ASAP - US OB Comp Less 14 Wks; Future  3. [redacted] weeks gestation of pregnancy -continue PNV  4. Multigravida of advanced maternal age in first trimester   5. Chronic hypertension affecting pregnancy -continue zestoril   6. Diabetes mellitus without complication (HCC) -continue glipizide        I discussed the assessment and treatment plan with the patient. The patient was provided an opportunity to ask questions and all were answered. The patient agreed with the plan and demonstrated an understanding of the instructions.   The patient was advised to call back or seek an in-person evaluation/go to the ED if the symptoms worsen or if the condition fails to improve as anticipated.  I provided 8 minutes of  non-face-to-face time during this encounter.   [name] MourningJennifer Langdon Crosson, NP [facility], [facility]

## 2018-10-13 ENCOUNTER — Ambulatory Visit (INDEPENDENT_AMBULATORY_CARE_PROVIDER_SITE_OTHER): Payer: No Typology Code available for payment source

## 2018-10-13 ENCOUNTER — Telehealth: Payer: Self-pay | Admitting: Adult Health

## 2018-10-13 ENCOUNTER — Other Ambulatory Visit: Payer: Self-pay

## 2018-10-13 ENCOUNTER — Ambulatory Visit: Payer: Self-pay | Admitting: Obstetrics & Gynecology

## 2018-10-13 ENCOUNTER — Telehealth: Payer: Self-pay | Admitting: *Deleted

## 2018-10-13 DIAGNOSIS — O3680X0 Pregnancy with inconclusive fetal viability, not applicable or unspecified: Secondary | ICD-10-CM | POA: Diagnosis not present

## 2018-10-13 DIAGNOSIS — Z3A11 11 weeks gestation of pregnancy: Secondary | ICD-10-CM | POA: Diagnosis not present

## 2018-10-13 NOTE — Telephone Encounter (Signed)
Patient states she has been dealing with a lot of stress and is having abdominal pain. She is not having any bleeding but is just worried as she has had a miscarriage in the past around 10 weeks.  I discussed with her that we could get her in today for a dating u/s but unfortunately if she were to be experiencing a miscarriage, there is not anything that could be done.  Pt verbalized understanding.  Will change dating u/s appt to today.

## 2018-10-13 NOTE — Telephone Encounter (Signed)
Pt states that she has been going through a stressful situation and today she has been having a lot of stomach pains. She is wanting to know if she should come in to the office or go to the ER.

## 2018-10-13 NOTE — Progress Notes (Signed)
[place] 6+1 wks,single IUP w/ys,crl 5.08 mm,normal left ovary,simple right corpus luteal cyst 2.3 x 2.4 x 2.1 cm,FHR 122 bpm

## 2018-10-18 ENCOUNTER — Telehealth: Payer: Self-pay | Admitting: *Deleted

## 2018-10-18 DIAGNOSIS — E785 Hyperlipidemia, unspecified: Secondary | ICD-10-CM

## 2018-10-18 DIAGNOSIS — I10 Essential (primary) hypertension: Secondary | ICD-10-CM

## 2018-10-18 DIAGNOSIS — E1165 Type 2 diabetes mellitus with hyperglycemia: Secondary | ICD-10-CM

## 2018-10-18 MED ORDER — FLUOXETINE HCL 20 MG PO CAPS: 20.0000 mg | ORAL_CAPSULE | Freq: Every day | ORAL | 3 refills | Status: DC

## 2018-10-18 NOTE — Telephone Encounter (Signed)
Pt scheduled appt for 2 weeks out needs updated lab orders also wanted to know if her prozac could be changed to a capsule so her insurance would cover it

## 2018-10-18 NOTE — Telephone Encounter (Signed)
Expired labs re-entered. Prozac tablets changed to capsules so insurance would cover them.

## 2018-10-24 ENCOUNTER — Other Ambulatory Visit: Payer: Self-pay

## 2018-10-31 ENCOUNTER — Telehealth: Payer: Self-pay | Admitting: Obstetrics & Gynecology

## 2018-10-31 ENCOUNTER — Encounter: Payer: Self-pay | Admitting: Family Medicine

## 2018-10-31 LAB — COMPLETE METABOLIC PANEL WITH GFR
AG Ratio: 1.1 (calc) (ref 1.0–2.5)
ALT: 9 U/L (ref 6–29)
AST: 12 U/L (ref 10–30)
Albumin: 3.8 g/dL (ref 3.6–5.1)
Alkaline phosphatase (APISO): 53 U/L (ref 31–125)
BUN: 8 mg/dL (ref 7–25)
CO2: 24 mmol/L (ref 20–32)
Calcium: 9.7 mg/dL (ref 8.6–10.2)
Chloride: 102 mmol/L (ref 98–110)
Creat: 0.69 mg/dL (ref 0.50–1.10)
GFR, Est African American: 126 mL/min/{1.73_m2} (ref >=60)
GFR, Est Non African American: 109 mL/min/{1.73_m2} (ref >=60)
Globulin: 3.6 g/dL (calc) (ref 1.9–3.7)
Glucose, Bld: 111 mg/dL (ref 65–139)
Potassium: 3.9 mmol/L (ref 3.5–5.3)
Sodium: 135 mmol/L (ref 135–146)
Total Bilirubin: 0.3 mg/dL (ref 0.2–1.2)
Total Protein: 7.4 g/dL (ref 6.1–8.1)

## 2018-10-31 LAB — LIPID PANEL
Cholesterol: 270 mg/dL — ABNORMAL HIGH (ref <200)
HDL: 39 mg/dL — ABNORMAL LOW (ref >=50)
LDL Cholesterol (Calc): 185 mg/dL (calc) — ABNORMAL HIGH
Non-HDL Cholesterol (Calc): 231 mg/dL (calc) — ABNORMAL HIGH (ref <130)
Total CHOL/HDL Ratio: 6.9 (calc) — ABNORMAL HIGH (ref <5.0)
Triglycerides: 255 mg/dL — ABNORMAL HIGH (ref <150)

## 2018-10-31 LAB — HEMOGLOBIN A1C
Hgb A1c MFr Bld: 10.5 % of total Hgb — ABNORMAL HIGH (ref <5.7)
Mean Plasma Glucose: 255 (calc)
eAG (mmol/L): 14.1 (calc)

## 2018-10-31 LAB — MICROALBUMIN, URINE: Microalb, Ur: 2.2 mg/dL

## 2018-10-31 NOTE — Telephone Encounter (Signed)
Patient called, said she'll be 9 weeks this Thursday, she is seeing light pink on her tissue.  She has a history of miscarriages per patient.    [phone]

## 2018-10-31 NOTE — Telephone Encounter (Signed)
Patient states she noticed some very light pink spotting earlier today after she went to the bathroom.  She is not currently having any bleeding and is not having any cramping.  She did have intercourse on Sunday.  Informed patient that some light spotting is not uncommon in the first trimester and to continue to monitor and if the bleeding becomes bright in color, heavier or she has cramping, to let us know.  Pt verbalized understanding.

## 2018-11-01 ENCOUNTER — Ambulatory Visit (INDEPENDENT_AMBULATORY_CARE_PROVIDER_SITE_OTHER): Payer: No Typology Code available for payment source | Admitting: Family Medicine

## 2018-11-01 ENCOUNTER — Other Ambulatory Visit: Payer: Self-pay

## 2018-11-01 ENCOUNTER — Telehealth (INDEPENDENT_AMBULATORY_CARE_PROVIDER_SITE_OTHER): Payer: No Typology Code available for payment source

## 2018-11-01 VITALS — BP 118/64 | HR 85 | Resp 12 | Ht <= 58 in | Wt 124.1 lb

## 2018-11-01 DIAGNOSIS — I10 Essential (primary) hypertension: Secondary | ICD-10-CM

## 2018-11-01 DIAGNOSIS — F322 Major depressive disorder, single episode, severe without psychotic features: Secondary | ICD-10-CM

## 2018-11-01 DIAGNOSIS — E1165 Type 2 diabetes mellitus with hyperglycemia: Secondary | ICD-10-CM | POA: Diagnosis not present

## 2018-11-01 DIAGNOSIS — E785 Hyperlipidemia, unspecified: Secondary | ICD-10-CM

## 2018-11-01 DIAGNOSIS — F331 Major depressive disorder, recurrent, moderate: Secondary | ICD-10-CM | POA: Insufficient documentation

## 2018-11-01 DIAGNOSIS — Z794 Long term (current) use of insulin: Secondary | ICD-10-CM

## 2018-11-01 NOTE — Assessment & Plan Note (Addendum)
pHQ 9 score of 17 on fluoxetine 20 mg for 3 weeks, refer to therapy, and also to Psych

## 2018-11-01 NOTE — Patient Instructions (Addendum)
F/U in 11 months, call if  You need me sooner  I am referring you to Dr Dorris Fetch re diabetes management.   I will message you later re the specific medications, lisnopril/ hctz, for BP  Fluoxetine for depression and will refer you to therapy as well as to Psychiatry  PLEASE avoid oils and butter, eff yolk and only do string cheese and skimmed milk or low fat  It is important that you exercise regularly at least 30 minutes 5 times a week. If you develop chest pain, have severe difficulty breathing, or feel very tired, stop exercising immediately and seek medical attention

## 2018-11-01 NOTE — Progress Notes (Signed)
   [name] Fernandez     MRN: [id number]      DOB: [birth year]   HPI [name] Fernandez is here [redacted] weeks pregnant, for medication management and appropriate referrals for chronic disease management. She has not been following these problems regularly, but now pregnant, here for help. States she has been depressed, lost her only brother to [facility] in December at age 41, and this , as well as raising her 3 children, the youngest autistic, as well as Covid 19 have been overwhelming. States she can see the benefit of fluoxetine 20 mg , although taking for only 3 weeks. Very interested in therapy.  Blood pressure management also needed, still taking Ace despite pregnancy , also glipizide , both will be changed ROS Denies recent fever or chills. Denies sinus pressure, nasal congestion, ear pain or sore throat. Denies chest congestion, productive cough or wheezing. Denies chest pains, palpitations and leg swelling Denies abdominal pain, nausea, vomiting,diarrhea or constipation.   Denies dysuria, frequency, hesitancy or incontinence. Denies joint pain, swelling and limitation in mobility. Denies headaches, seizures, numbness, or tingling. . Denies skin break down or rash.   PE  BP 118/64   Pulse 85   Resp 12   Ht 4\' 9"  (1.448 m)   Wt 124 lb 1.9 oz (56.3 kg)   LMP 07/25/2018 (LMP Unknown)   SpO2 99%   BMI 26.86 kg/m   Patient alert and oriented and in no cardiopulmonary distress.  HEENT: No facial asymmetry, EOMI,   oropharynx pink and moist.  Neck supple no JVD, no mass.  Chest: Clear to auscultation bilaterally.  CVS: S1, S2 no murmurs, no S3.Regular rate.  ABD: Soft non tender.   Ext: No edema  MS: Adequate ROM spine, shoulders, hips and knees.  Skin: Intact, no ulcerations or rash noted.  Psych: Good eye contact, normal affect. Memory intact not anxious or depressed appearing.  CNS: CN 2-12 intact, power,  normal throughout.no focal deficits noted.   Assessment & Plan Depression,  major, single episode, severe (HCC) pHQ 9 score of 17 on fluoxetine 20 mg for 3 weeks, refer to therapy, and also to Psych   Essential hypertension Controlled but need to change to medication appropriate for use in pregnancy. Start aldomet twice daily. Ob to follow  Hyperlipidemia LDL goal <100 Hyperlipidemia:Low fat diet discussed and encouraged.   Lipid Panel  Lab Results  Component Value Date   CHOL 270 (H) 10/30/2018   HDL 39 (L) 10/30/2018   LDLCALC 185 (H) 10/30/2018   TRIG 255 (H) 10/30/2018   CHOLHDL 6.9 (H) 10/30/2018   Statin contraindicated in pregnancy, educated re low fat diet and the importance of following this as her current heart disease risk is elevated above normal     DM (diabetes mellitus), type 2, uncontrolled Uncontrolled pregnant diabetic. Needs to be managed by Endo. Advised to d/c glipizide and appt with Endo in 1 day

## 2018-11-01 NOTE — BH Specialist Note (Signed)
[name] Fernandez Initial Clinical Assessment  MRN: [id number] NAME: [name] Fernandez Date: 11/01/18    Start time: Start Time: 0930 End time:10:30   Total time:  1 Hour  Call number: Visit Number: 1- Initial Visit    Type of Contact: Type of Contact: Video Visit Initial Contact Patient consent obtained: Patient consent obtained for Virtual Visit: (NA) Reason for Visit today:  Video [facility] Initial Intake Assessment    Treatment History Patient recently received Inpatient Treatment: Have You Recently Been in Any Inpatient Treatment (Hospital/Detox/Crisis Center/28-Day Program)?: No  Facility/Program:  None Reported  Date of discharge:  None Reported  Patient currently being seen by therapist/psychiatrist: Do You Currently Have a Therapist/Psychiatrist?: No Patient currently receiving the following services: Patient Currently Receiving the Following Services:: Medication Management(PCP prescribes psychiatric medication.)   Psychiatric History  Past Psychiatric History/Hospitalization(s): Anxiety: Yes Bipolar Disorder: No Depression: Yes Mania: No Psychosis: No Schizophrenia: No Personality Disorder: No Hospitalization for psychiatric illness: No History of Electroconvulsive Shock Therapy: No Prior Suicide Attempts: No Decreased need for sleep: No  Euphoria: No Self Injurious behaviors No Family History of mental illness: No Family History of substance abuse: No  Substance Abuse: No  DUI: No  Insomnia: No  History of violence No  Physical, sexual or emotional abuse:No  Prior outpatient mental health therapy: No       Clinical Assessment:   PHQ-9 Assessments: Depression screen Total [facility] 11/01/2018 11/01/2018  Decreased Interest 1 0  Down, Depressed, Hopeless 1 0  PHQ - 2 Score 2 0  Altered sleeping 3 -  Tired, decreased energy 3 -  Change in appetite 3 -  Feeling bad or failure about yourself  1 -  Trouble concentrating 2 -  Moving slowly or fidgety/restless  3 -  Suicidal thoughts 0 -  PHQ-9 Score 17 -  Difficult doing work/chores Somewhat difficult -  Some encounter information is confidential and restricted. Go to Review Flowsheets activity to see all data.    GAD-7 Assessments: GAD 7 : Generalized Anxiety Score 11/01/2018  Nervous, Anxious, on Edge 2  Control/stop worrying 2  Worry too much - different things 2  Trouble relaxing 2  Restless 2  Easily annoyed or irritable 1  Afraid - awful might happen 3  Total GAD 7 Score 14  Anxiety Difficulty Somewhat difficult     Social Functioning Social maturity: Social Maturity: Responsible Social judgement: Social Judgement: Normal   Stress Current stressors: Current Stressors: Other (Comment)([redacted] weeks pregnant; Strained relationship with her 18 and 41 year old daughter) Familial stressors: Familial Stressors: None Sleep: Sleep: Decreased, Difficulty staying asleep Appetite: Appetite: Loss of appetite  Coping ability: Coping ability: Exhausted, Overwhelmed   Patient taking medications as prescribed: Patient taking medications as prescribed: Yes  Current medications:  Outpatient Encounter Medications as of 11/01/2018  Medication Sig  . FLUoxetine (PROZAC) 20 MG capsule Take 1 capsule (20 mg total) by mouth daily.  Marland Kitchen. glipiZIDE (GLUCOTROL XL) 10 MG 24 hr tablet Two tablets once daily with breakfast  . lisinopril-hydrochlorothiazide (ZESTORETIC) 20-12.5 MG tablet Take 1 tablet by mouth daily.  . [DISCONTINUED] Insulin Glargine (LANTUS) 100 UNIT/ML Solostar Pen Inject 15 Units into the skin daily at 10 pm.   Facility-Administered Encounter Medications as of 11/01/2018  Medication  . pramoxine-hydrocortisone (PROCTOCREAM-HC) rectal cream    Self-harm Behaviors Risk Assessment Self-harm risk factors: Self-harm risk factors: (None Reported) Patient endorses recent thoughts of harming self: Have you recently had any thoughts about harming yourself?: No  [place] Suicide Severity Rating  Scale:  C-SRSS 2018-11-20  1. Wish to be Dead No  2. Suicidal Thoughts No  6. Suicide Behavior Question No    Danger to Others Risk Assessment Danger to others risk factors: Danger to Others Risk Factors: No risk factors noted Patient endorses recent thoughts of harming others: Notification required: No need or identified person    Substance Use Assessment Patient recently consumed alcohol:  None Reported  Alcohol Use Disorder Identification Test (AUDIT): No flowsheet data found. Patient recently used drugs:  None Reported Patient is concerned about dependence or abuse of substances:  None Reported    Goals, Interventions and Follow-up Plan Goals: Increase healthy adjustment to current life circumstances Interventions: Motivational Interviewing and Supportive Counseling Follow-up Plan: [facility] Phone Follow UP   Summary of Clinical Assessment  Summary:   Patient is 43 year old female.  Referral from the [facility] referral que from Dr. Moshe Cipro.  Stressors:  [name] Fernandez reports that she found out that she was 9-weeks pregnant.  [name] Fernandez was recently re-started on her Prozac medication.   [name] Fernandez was off her medication for several months because she did not have the money to take pay for the medication.  [name] Fernandez reports that when she is not taking her medication, "she is a different person".   Comfort reports stress due to a complicated pregnancy in which she is a Type 2 diabetic and she has High Blood Pressure.   [name] Fernandez reports anxiety and stress dealing with her 23 year old daughter that wants to transition from a girl to a boy and her 66 year old daughter that has been diagnosed with ADHD and Autism severe on the spectrum.   [name] Fernandez reports that at her job she is dealing with COVID and there have been some positive for COVID.  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  [name] Fernandez was previously  diagnosed with Depression. [name] Fernandez reports that she had a nervous breakdown 4 years ago when she had surgery on her gallbladder.  Maliha reports that she was very anxious and she was not able to eat or sleep and she felt as if her body was screaming on the inside.     Medication:  Darilyn reports that she takes Prozoac 20mg  off and on for over 4 year.  She started taking it again consistently in December of 2019.  Sinthia reports that she has been taking it every day since she found out that she was pregnant.     Social:  Patient lives with her live in boyfriend, her 49 year old and 31 year old daughters.  Journie reports that she received outpatient counseling in 2017 when she was going through her divorce.  Lorel denies prior inpatient psychiatric hospitalization.  Jasmina reports that she enjoys going to sleep, watching TV  (true crime stories), going out to ear.  Alyxandra reports that she has a lot of best friend who are supportive of her.  Jaynie reports that she co-parents her 3 children with her ex-husband.  Neeley has a very good relationship with her ex-husband. 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     Patient denies SI/HI/Psychosis/Substance Abuse. If your symptoms worsen or you have thoughts of suicide/homicide, PLEASE SEEK IMMEDIATE MEDICAL ATTENTION.  You may always call:  National Suicide Hotline: 929-809-6687620-201-9705; [place] Crisis Line: 423-066-0901431 377 3008; [place]: 613-273-1780831 328 3187.  These are available 24 hours a day, 7 days a week.   During the next session:   - Socorro will contact a therapist and a psychiatrist for her 3617 and 41 year old daughter  - Clinical research associateWriter will locate group therapy resources for her daughter  - Ranae PlumberWriter will locate resources for autistic children  - [place] Highlandamika will engage in self-care activities - Sanye will contact her nutritionist due to her  elevated levels with her diabeties.  - Writer will discuss with Dr. Vanetta ShawlHisada increasing her Prozac.    Phillip HealStevenson, Jeymi Hepp LaVerne, LCAS-A

## 2018-11-01 NOTE — Progress Notes (Signed)
[facility] Psychiatric Consultant Case Review   [name] Fernandez is a 43 y.o. year old female with a history of depression, hypertension, diabetes, currently 9 weeks pregnancy. She works as Immunologist at [employer]. Psychosocial stressors includes 25 year old daughter with transgender issues, and 76 year old daughter with ADHD, and autism. She has been taking fluoxetine consistently over the past few weeks.   Assessment/Provisional Diagnosis # MDD, recurrent Continue current dose of fluoxetine to target depression. Consider uptitration to target residual mood symptoms if applicable.  Recommendation - Continue fluoxetine 20 mg daily, and consider uptitration in the future. Please discuss risk of using SSRI during pregnancy, which includes but not limited to teratogenesis, spontaneous abortion, cardiovascular malformation, preterm birth and birth weight, persistent pulmonary hypertension in newborns, poor neonatal adaptation syndrome, autism. Would recommend to stay on fluoxetine given benefits of treating underlying mood symptoms outweighs risk to fetus. - Consider checking TSH to rule out medical cause of her mood symptoms if that is not done recently - [facility] specialist to provide resources for her daughters, coach self compassion, behavioral activation  Thank you for your consult. We will continue to follow the patient. Please contact [facility]  for any questions or concerns.   The above treatment considerations and suggestions are based on consultation with the [facility] specialist and/or PCP and a review of information available in the shared registry and the patient's [facility] Record (EHR). I have not personally examined the patient. All recommendations should be implemented with consideration of the patient's relevant prior history and current clinical status. Please feel free to call me with any questions about the care of this patient.

## 2018-11-02 ENCOUNTER — Encounter: Payer: Self-pay | Admitting: "Endocrinology

## 2018-11-02 ENCOUNTER — Encounter: Payer: Self-pay | Admitting: Family Medicine

## 2018-11-02 ENCOUNTER — Other Ambulatory Visit: Payer: Self-pay | Admitting: Family Medicine

## 2018-11-02 ENCOUNTER — Ambulatory Visit (INDEPENDENT_AMBULATORY_CARE_PROVIDER_SITE_OTHER): Payer: No Typology Code available for payment source | Admitting: "Endocrinology

## 2018-11-02 VITALS — BP 121/70 | HR 76 | Ht <= 58 in | Wt 126.0 lb

## 2018-11-02 DIAGNOSIS — Z3A09 9 weeks gestation of pregnancy: Secondary | ICD-10-CM

## 2018-11-02 DIAGNOSIS — E1165 Type 2 diabetes mellitus with hyperglycemia: Secondary | ICD-10-CM

## 2018-11-02 MED ORDER — LEVEMIR FLEXTOUCH 100 UNIT/ML ~~LOC~~ SOPN: 30.0000 [IU] | PEN_INJECTOR | Freq: Every day | SUBCUTANEOUS | 2 refills | Status: DC

## 2018-11-02 MED ORDER — METHYLDOPA 250 MG PO TABS: ORAL_TABLET | ORAL | 1 refills | Status: DC

## 2018-11-02 NOTE — Progress Notes (Signed)
Endocrinology Consult Note       11/02/2018, 3:56 PM   Subjective:    Patient ID: [name] Fernandez, female    DOB: 1975-06-06.  [name] Fernandez is being seen in consultation for management of currently uncontrolled symptomatic type 2 diabetes diagnosed at approximate age of 24 years, currently pregnant at [redacted] weeks with her fourth child.   PMD:   Fayrene Helper, MD.   Past Medical History:  Diagnosis Date  . Anxiety   . Arthritis   . Chlamydia infection 03/15/2016  . Depression   . Diabetes mellitus   . GERD (gastroesophageal reflux disease)   . Headache(784.0)   . Hyperlipidemia   . Hypertension     Past Surgical History:  Procedure Laterality Date  . BREAST SURGERY     right lumpectomy, benign  . CHOLECYSTECTOMY N/A 12/27/2013   Procedure: LAPAROSCOPIC CHOLECYSTECTOMY ;  Surgeon: Scherry Ran, MD;  Location: [facility];  Service: General;  Laterality: N/A;    Social History   Socioeconomic History  . Marital status: Divorced    Spouse name: Not on file  . Number of children: 3  . Years of education: Not on file  . Highest education level: Not on file  Occupational History  . Not on file  Social Needs  . Financial resource strain: Not on file  . Food insecurity    Worry: Not on file    Inability: Not on file  . Transportation needs    Medical: Not on file    Non-medical: Not on file  Tobacco Use  . Smoking status: Never Smoker  . Smokeless tobacco: Never Used  Substance and Sexual Activity  . Alcohol use: Not Currently    Alcohol/week: 0.0 standard drinks    Comment: occ. wine a glass  . Drug use: No  . Sexual activity: Yes    Birth control/protection: None  Lifestyle  . Physical activity    Days per week: Not on file    Minutes per session: Not on file  . Stress: Not on file  Relationships  . Social Herbalist on phone: Not on file    Gets together: Not  on file    Attends religious service: Not on file    Active member of club or organization: Not on file    Attends meetings of clubs or organizations: Not on file    Relationship status: Not on file  Other Topics Concern  . Not on file  Social History Narrative  . Not on file    Family History  Problem Relation Age of Onset  . Depression Mother   . Alcohol abuse Mother   . Depression Brother   . Sickle cell anemia Brother   . Hypertension Brother   . Diabetes Father   . COPD Father   . Anxiety disorder Maternal Aunt     Outpatient Encounter Medications as of 11/02/2018  Medication Sig  . FLUoxetine (PROZAC) 20 MG capsule Take 1 capsule (20 mg total) by mouth daily.  . Insulin Detemir (LEVEMIR FLEXTOUCH) 100 UNIT/ML Pen Inject 30 Units into the skin at  bedtime.  . methyldopa (ALDOMET) 250 MG tablet Take one tablet by mouth two times daily for blood pressure, 12 hours apart  . [DISCONTINUED] glipiZIDE (GLUCOTROL XL) 10 MG 24 hr tablet Two tablets once daily with breakfast  . [DISCONTINUED] Insulin Glargine (LANTUS) 100 UNIT/ML Solostar Pen Inject 15 Units into the skin daily at 10 pm.  . [DISCONTINUED] lisinopril-hydrochlorothiazide (ZESTORETIC) 20-12.5 MG tablet Take 1 tablet by mouth daily.   Facility-Administered Encounter Medications as of 11/02/2018  Medication  . pramoxine-hydrocortisone (PROCTOCREAM-HC) rectal cream    ALLERGIES: Allergies  Allergen Reactions  . Janumet [Sitagliptin-Metformin Hcl] Nausea And Vomiting    VACCINATION STATUS: Immunization History  Administered Date(s) Administered  . Influenza,inj,Quad PF,6+ Mos 02/06/2014, 01/16/2018  . PPD Test 09/30/2015, 10/21/2015, 09/14/2016  . Pneumococcal Polysaccharide-23 08/25/2009  . Tdap 06/23/2011    Diabetes She presents for her initial diabetic visit. She has type 2 diabetes mellitus. Onset time: She was diagnosed at approximate age of 27 years. Her disease course has been worsening. There are no  hypoglycemic associated symptoms. Pertinent negatives for hypoglycemia include no confusion, headaches, pallor or seizures. Associated symptoms include fatigue, polydipsia and polyuria. Pertinent negatives for diabetes include no chest pain and no polyphagia. There are no hypoglycemic complications. Symptoms are worsening. Risk factors for coronary artery disease include dyslipidemia, diabetes mellitus and family history. Her weight is increasing steadily. She is following a generally unhealthy diet. When asked about meal planning, she reported none. She has not had a previous visit with a dietitian. She rarely participates in exercise. (She did not bring any meter nor logs to review today.  Her recent labs show A1c of 10.5%.  She is pregnant at [redacted] weeks with her fourth child.) An ACE inhibitor/angiotensin II receptor blocker is not being taken. Eye exam is current.  Hyperlipidemia This is a chronic problem. The current episode started more than 1 year ago. The problem is uncontrolled. Exacerbating diseases include diabetes. Pertinent negatives include no chest pain, myalgias or shortness of breath. She is currently on no antihyperlipidemic treatment. Risk factors for coronary artery disease include dyslipidemia, diabetes mellitus, family history and hypertension.  Hypertension This is a chronic problem. The problem is controlled. Pertinent negatives include no chest pain, headaches, palpitations or shortness of breath. Risk factors for coronary artery disease include diabetes mellitus and dyslipidemia. Past treatments include nothing.     Review of Systems  Constitutional: Positive for fatigue. Negative for chills, fever and unexpected weight change.  HENT: Negative for trouble swallowing and voice change.   Eyes: Negative for visual disturbance.  Respiratory: Negative for cough, shortness of breath and wheezing.   Cardiovascular: Negative for chest pain, palpitations and leg swelling.   Gastrointestinal: Negative for diarrhea, nausea and vomiting.  Endocrine: Positive for polydipsia and polyuria. Negative for cold intolerance, heat intolerance and polyphagia.  Musculoskeletal: Negative for arthralgias and myalgias.  Skin: Negative for color change, pallor, rash and wound.  Neurological: Negative for seizures and headaches.  Psychiatric/Behavioral: Negative for confusion and suicidal ideas.    Objective:    BP 121/70   Pulse 76   Ht 4\' 9"  (1.448 m)   Wt 126 lb (57.2 kg)   LMP 07/25/2018 (LMP Unknown)   BMI 27.27 kg/m   Wt Readings from Last 3 Encounters:  11/02/18 126 lb (57.2 kg)  11/01/18 124 lb 1.9 oz (56.3 kg)  10/11/18 121 lb (54.9 kg)     Physical Exam Constitutional:      Appearance: She is well-developed.  HENT:  Head: Normocephalic and atraumatic.  Neck:     Musculoskeletal: Normal range of motion and neck supple.     Thyroid: No thyromegaly.     Trachea: No tracheal deviation.  Cardiovascular:     Rate and Rhythm: Normal rate and regular rhythm.  Pulmonary:     Effort: Pulmonary effort is normal.  Abdominal:     Tenderness: There is no abdominal tenderness. There is no guarding.  Musculoskeletal: Normal range of motion.  Skin:    General: Skin is warm and dry.     Coloration: Skin is not pale.     Findings: No erythema or rash.  Neurological:     Mental Status: She is alert and oriented to person, place, and time.     Cranial Nerves: No cranial nerve deficit.     Coordination: Coordination normal.     Deep Tendon Reflexes: Reflexes are normal and symmetric.  Psychiatric:        Judgment: Judgment normal.       CMP ( most recent) CMP     Component Value Date/Time   NA 135 10/30/2018 1150   K 3.9 10/30/2018 1150   CL 102 10/30/2018 1150   CO2 24 10/30/2018 1150   GLUCOSE 111 10/30/2018 1150   BUN 8 10/30/2018 1150   CREATININE 0.69 10/30/2018 1150   CALCIUM 9.7 10/30/2018 1150   PROT 7.4 10/30/2018 1150   ALBUMIN 3.5  (L) 06/04/2016 1053   AST 12 10/30/2018 1150   ALT 9 10/30/2018 1150   ALKPHOS 67 06/04/2016 1053   BILITOT 0.3 10/30/2018 1150   GFRNONAA 109 10/30/2018 1150   GFRAA 126 10/30/2018 1150     Diabetic Labs (most recent): Lab Results  Component Value Date   HGBA1C 10.5 (H) 10/30/2018   HGBA1C 12.1 (H) 06/04/2016   HGBA1C 13.3 (H) 10/22/2015     Lipid Panel ( most recent) Lipid Panel     Component Value Date/Time   CHOL 270 (H) 10/30/2018 1150   TRIG 255 (H) 10/30/2018 1150   HDL 39 (L) 10/30/2018 1150   CHOLHDL 6.9 (H) 10/30/2018 1150   VLDL 39 (H) 10/22/2015 0928   LDLCALC 185 (H) 10/30/2018 1150      Lab Results  Component Value Date   TSH 1.09 10/22/2015   TSH 0.991 01/31/2013   TSH 1.225 06/17/2011           Assessment & Plan:   1. Uncontrolled type 2 diabetes mellitus with hyperglycemia (HCC) 2. [redacted] weeks gestation of pregnancy  - Marcella C Corine ShelterWatkins has currently uncontrolled symptomatic type 2 DM since  43 years of age,  with most recent A1c of 10.5 %. Recent labs reviewed.  -She is pregnant at [redacted] weeks of gestation.  - I had a long discussion with her about the progressive nature of diabetes and the pathology behind its complications. -She does not report gross complications from her diabetes, was severely uncontrolled prior to conception putting her at risk for pregnancy related complications.  -She is also at extremely high high risk for more acute and chronic complications which include CAD, CVA, CKD, retinopathy, and neuropathy. These are all discussed in detail with her.  - I have counseled her on diet management  by adopting a carbohydrate restricted/protein rich diet. - she admits that there is a room for improvement in her food and drink choices. - Suggestion is made for her to avoid simple carbohydrates  from her diet including Cakes, Sweet Desserts, Ice Cream, Soda (diet  and regular), Sweet Tea, Candies, Chips, Cookies, Store Bought Juices, Alcohol  in Excess of  1-2 drinks a day, Artificial Sweeteners,  Coffee Creamer, and "Sugar-free" Products. This will help patient to have more stable blood glucose profile and potentially avoid unintended weight gain.  - I encouraged her to switch to  unprocessed or minimally processed complex starch and increased protein intake (animal or plant source), fruits, and vegetables.  - she is advised to stick to a routine mealtimes to eat 3 meals  a day and avoid unnecessary snacks ( to snack only to correct hypoglycemia).   - she will be scheduled with Norm Salt, RDN, CDE for individualized diabetes education.  - I have approached her with the following individualized plan to manage diabetes and patient agrees:   -Given her first trimester pregnancy and current glycemic burden, she will likely require intensive treatment with basal/bolus insulin in order for her to achieve control of diabetes to target.    -Trimester specific glycemic targets are discussed with her.   -Since she is not on any treatment at this time, to start, I discussed and initiated Levemir 30 units nightly.   -She is approached to start strict monitoring of blood glucose at least 4 times a day until next  Visit, before meals and at bedtime and return in 7-10 with her meter and logs for reevaluation.  -She is advised to avoid any other medications for diabetes she may have at home including glipizide. - she is encouraged to call clinic for blood glucose levels less than 70 or above 200 mg /dl. -If he returns with significant hyperglycemia, she will be considered for prandial insulin such as NovoLog.  - she is not a candidate for metformin, SGLT2 inhibitors, incretin therapy for now.   - Patient specific target  A1c;  LDL, HDL, Triglycerides, and  Waist Circumference were discussed in detail.  2) Blood Pressure /Hypertension:  her blood pressure is controlled to target.   she is not on any antihypertensive medications at this  time. She will be closely followed up without intervention at this time. 3) Lipids/Hyperlipidemia:   Review of her recent lipid panel showed uncontrolled  LDL at 185 .  she  is not a candidate for statin therapy while she is pregnant.     4)  Weight/Diet:  Body mass index is 27.27 kg/m.  She is not a weight loss candidate. - CDE Consult will be initiated . Exercise, and detailed carbohydrates information provided  -  detailed on discharge instructions.  5) Chronic Care/Health Maintenance:  -she   is encouraged to initiate and continue to follow up with Ophthalmology, Dentist,  Podiatrist at least yearly or according to recommendations, and advised to  stay away from smoking. I have recommended yearly flu vaccine and pneumonia vaccine at least every 5 years; moderate intensity exercise for up to 150 minutes weekly; and  sleep for at least 7 hours a day.  - she is  advised to maintain close follow up with Kerri Perches, MD for primary care needs, as well as her other providers for optimal and coordinated care.  - Time spent with the patient: 45 minutes, of which >50% was spent in obtaining information about her symptoms, reviewing her previous labs/studies, evaluations, and treatments, counseling her about her currently uncontrolled type 2 diabetes plus first trimester pregnancy, and developing plans for long term treatment based on the latest standards of care/guidelines.  Please refer to " Patient Self Inventory" in the Media  tab for reviewed elements of pertinent patient history.  Ardeen Garlandamika C Ruta participated in the discussions, expressed understanding, and voiced agreement with the above plans.  All questions were answered to her satisfaction. she is encouraged to contact clinic should she have any questions or concerns prior to her return visit.  Follow up plan: - Return in about 10 days (around 11/12/2018) for Follow up with Meter and Logs Only - no Labs.  Marquis LunchGebre Victory Dresden, MD [facility] [address] [place], [place] [address] Phone: [phone]  Fax: [phone]    11/02/2018, 3:56 PM  This note was partially dictated with voice recognition software. Similar sounding words can be transcribed inadequately or may not  be corrected upon review.

## 2018-11-03 ENCOUNTER — Telehealth: Payer: Self-pay

## 2018-11-03 ENCOUNTER — Other Ambulatory Visit: Payer: Self-pay | Admitting: Internal Medicine

## 2018-11-03 ENCOUNTER — Other Ambulatory Visit: Payer: Self-pay | Admitting: "Endocrinology

## 2018-11-03 DIAGNOSIS — Z20822 Contact with and (suspected) exposure to covid-19: Secondary | ICD-10-CM

## 2018-11-03 DIAGNOSIS — Z20828 Contact with and (suspected) exposure to other viral communicable diseases: Secondary | ICD-10-CM

## 2018-11-03 MED ORDER — HUMULIN N KWIKPEN 100 UNIT/ML ~~LOC~~ SUPN: PEN_INJECTOR | SUBCUTANEOUS | 2 refills | Status: DC

## 2018-11-03 NOTE — Telephone Encounter (Signed)
[name] Fernandez is calling stating that she went to the pharmacy to pick up [name] Fernandez and it is going to cost her $400.00 and she cant afford it, please advise?

## 2018-11-03 NOTE — Telephone Encounter (Signed)
Unfortunately I could not reach her via any of her phones listed. I sent a rx for NPH to her pharmacy, with instruction for the pharmacy to call her for pick up.

## 2018-11-04 ENCOUNTER — Inpatient Hospital Stay (HOSPITAL_COMMUNITY): Payer: PRIVATE HEALTH INSURANCE

## 2018-11-04 ENCOUNTER — Encounter: Payer: Self-pay | Admitting: Family Medicine

## 2018-11-04 ENCOUNTER — Inpatient Hospital Stay (HOSPITAL_COMMUNITY)
Admission: AD | Admit: 2018-11-04 | Discharge: 2018-11-04 | Disposition: A | Discharge status: Home / Self Care | Payer: PRIVATE HEALTH INSURANCE | Attending: Family Medicine | Admitting: Family Medicine

## 2018-11-04 ENCOUNTER — Encounter (HOSPITAL_COMMUNITY): Payer: Self-pay | Admitting: *Deleted

## 2018-11-04 ENCOUNTER — Other Ambulatory Visit: Payer: Self-pay

## 2018-11-04 DIAGNOSIS — Z3A01 Less than 8 weeks gestation of pregnancy: Secondary | ICD-10-CM

## 2018-11-04 DIAGNOSIS — O021 Missed abortion: Secondary | ICD-10-CM | POA: Insufficient documentation

## 2018-11-04 DIAGNOSIS — R102 Pelvic and perineal pain: Secondary | ICD-10-CM | POA: Diagnosis present

## 2018-11-04 LAB — URINALYSIS, ROUTINE W REFLEX MICROSCOPIC
Bacteria, UA: NONE SEEN
Bilirubin Urine: NEGATIVE
Glucose, UA: >=500 mg/dL — AB
Ketones, ur: NEGATIVE mg/dL
Nitrite: NEGATIVE
Protein, ur: 30 mg/dL — AB
RBC / HPF: >50 RBC/hpf — ABNORMAL HIGH (ref 0–5)
Specific Gravity, Urine: 1.012 (ref 1.005–1.030)
pH: 5 (ref 5.0–8.0)

## 2018-11-04 MED ORDER — ONDANSETRON 8 MG PO TBDP: 8.0000 mg | ORAL_TABLET | Freq: Three times a day (TID) | ORAL | 0 refills | Status: DC | PRN

## 2018-11-04 MED ORDER — MISOPROSTOL 200 MCG PO TABS
800.0000 ug | ORAL_TABLET | Freq: Once | ORAL | Status: AC
  Administered 2018-11-04: 800 ug via BUCCAL
  Filled 2018-11-04: qty 4

## 2018-11-04 MED ORDER — IBUPROFEN 800 MG PO TABS: 800.0000 mg | ORAL_TABLET | Freq: Three times a day (TID) | ORAL | 0 refills | Status: DC | PRN

## 2018-11-04 MED ORDER — OXYCODONE-ACETAMINOPHEN 5-325 MG PO TABS: 1.0000 | ORAL_TABLET | Freq: Four times a day (QID) | ORAL | 0 refills | Status: DC | PRN

## 2018-11-04 MED ORDER — OXYCODONE-ACETAMINOPHEN 5-325 MG PO TABS
1.0000 | ORAL_TABLET | Freq: Once | ORAL | Status: AC
  Administered 2018-11-04: 1 via ORAL
  Filled 2018-11-04: qty 1

## 2018-11-04 MED ORDER — MISOPROSTOL 200 MCG PO TABS: 800.0000 ug | ORAL_TABLET | Freq: Once | ORAL | 0 refills | Status: DC

## 2018-11-04 NOTE — Discharge Instructions (Signed)
Miscarriage °A miscarriage is the loss of an unborn baby (fetus) before the 20th week of pregnancy. Most miscarriages happen during the first 3 months of pregnancy. Sometimes, a miscarriage can happen before a woman knows that she is pregnant. °Having a miscarriage can be an emotional experience. If you have had a miscarriage, talk with your health care provider about any questions you may have about miscarrying, the grieving process, and your plans for future pregnancy. °What are the causes? °A miscarriage may be caused by: °· Problems with the genes or chromosomes of the fetus. These problems make it impossible for the baby to develop normally. They are often the result of random errors that occur early in the development of the baby, and are not passed from parent to child (not inherited). °· Infection of the cervix or uterus. °· Conditions that affect hormone balance in the body. °· Problems with the cervix, such as the cervix opening and thinning before pregnancy is at term (cervical insufficiency). °· Problems with the uterus. These may include: °? A uterus with an abnormal shape. °? Fibroids in the uterus. °? Congenital abnormalities. These are problems that were present at birth. °· Certain medical conditions. °· Smoking, drinking alcohol, or using drugs. °· Injury (trauma). °In many cases, the cause of a miscarriage is not known. °What are the signs or symptoms? °Symptoms of this condition include: °· Vaginal bleeding or spotting, with or without cramps or pain. °· Pain or cramping in the abdomen or lower back. °· Passing fluid, tissue, or blood clots from the vagina. °How is this diagnosed? °This condition may be diagnosed based on: °· A physical exam. °· Ultrasound. °· Blood tests. °· Urine tests. °How is this treated? °Treatment for a miscarriage is sometimes not necessary if you naturally pass all the tissue that was in your uterus. If necessary, this condition may be treated with: °· Dilation and  curettage (D&C). This is a procedure in which the cervix is stretched open and the lining of the uterus (endometrium) is scraped. This is done only if tissue from the fetus or placenta remains in the body (incomplete miscarriage). °· Medicines, such as: °? Antibiotic medicine, to treat infection. °? Medicine to help the body pass any remaining tissue. °? Medicine to reduce (contract) the size of the uterus. These medicines may be given if you have a lot of bleeding. °If you have Rh negative blood and your baby was Rh positive, you will need a shot of a medicine called Rh immunoglobulinto protect your future babies from Rh blood problems. "Rh-negative" and "Rh-positive" refer to whether or not the blood has a specific protein found on the surface of red blood cells (Rh factor). °Follow these instructions at home: °Medicines ° °· Take over-the-counter and prescription medicines only as told by your health care provider. °· If you were prescribed antibiotic medicine, take it as told by your health care provider. Do not stop taking the antibiotic even if you start to feel better. °· Do not take NSAIDs, such as aspirin and ibuprofen, unless they are approved by your health care provider. These medicines can cause bleeding. °Activity °· Rest as directed. Ask your health care provider what activities are safe for you. °· Have someone help with home and family responsibilities during this time. °General instructions °· Keep track of the number of sanitary pads you use each day and how soaked (saturated) they are. Write down this information. °· Monitor the amount of tissue or blood clots that   you pass from your vagina. Save any large amounts of tissue for your health care provider to examine. °· Do not use tampons, douche, or have sex until your health care provider approves. °· To help you and your partner with the process of grieving, talk with your health care provider or seek counseling. °· When you are ready, meet with  your health care provider to discuss any important steps you should take for your health. Also, discuss steps you should take to have a healthy pregnancy in the future. °· Keep all follow-up visits as told by your health care provider. This is important. °Where to find more information °· The American Congress of Obstetricians and Gynecologists: www.acog.org °· U.[place]. Department of Health and Human Services Office of Women’s Health: www.womenshealth.gov °Contact a health care provider if: °· You have a fever or chills. °· You have a foul smelling vaginal discharge. °· You have more bleeding instead of less. °Get help right away if: °· You have severe cramps or pain in your back or abdomen. °· You pass blood clots or tissue from your vagina that is walnut-sized or larger. °· You soak more than 1 regular sanitary pad in an hour. °· You become light-headed or weak. °· You pass out. °· You have feelings of sadness that take over your thoughts, or you have thoughts of hurting yourself. °Summary °· Most miscarriages happen in the first 3 months of pregnancy. Sometimes miscarriage happens before a woman even knows that she is pregnant. °· Follow your health care provider's instruction for home care. Keep all follow-up appointments. °· To help you and your partner with the process of grieving, talk with your health care provider or seek counseling. °This information is not intended to replace advice given to you by your health care provider. Make sure you discuss any questions you have with your health care provider. °Document Released: 10/20/2000 Document Revised: 08/18/2018 Document Reviewed: 06/01/2016 °Elsevier Patient Education © 2020 [employer] ° °

## 2018-11-04 NOTE — Assessment & Plan Note (Signed)
Hyperlipidemia:Low fat diet discussed and encouraged.   Lipid Panel  Lab Results  Component Value Date   CHOL 270 (H) 10/30/2018   HDL 39 (L) 10/30/2018   LDLCALC 185 (H) 10/30/2018   TRIG 255 (H) 10/30/2018   CHOLHDL 6.9 (H) 10/30/2018   Statin contraindicated in pregnancy, educated re low fat diet and the importance of following this as her current heart disease risk is elevated above normal

## 2018-11-04 NOTE — MAU Provider Note (Signed)
History     CSN: [id number]  Arrival date and time: 11/04/18 4481   First Provider Initiated Contact with Patient 11/04/18 2014      Chief Complaint  Patient presents with  . Abdominal Pain  . Vaginal Bleeding   Vaginal Bleeding The patient's primary symptoms include pelvic pain and vaginal bleeding. This is a new problem. The current episode started yesterday. The problem occurs constantly. The problem has been gradually worsening. The problem affects both sides. She is pregnant. Pertinent negatives include no chills, diarrhea, dysuria, fever, frequency, nausea or vomiting. The vaginal discharge was bloody. The vaginal bleeding is typical of menses. She has not been passing clots. She has not been passing tissue. Nothing aggravates the symptoms. She has tried nothing for the symptoms. Sexual activity: reports intercourse 2 days ago.    OB History    Gravida  5   Para  3   Term      Preterm      AB  1   Living  3     SAB  1   TAB      Ectopic      Multiple      Live Births              Past Medical History:  Diagnosis Date  . Anxiety   . Arthritis   . Chlamydia infection 03/15/2016  . Depression   . Diabetes mellitus   . GERD (gastroesophageal reflux disease)   . Headache(784.0)   . Hyperlipidemia   . Hypertension     Past Surgical History:  Procedure Laterality Date  . BREAST SURGERY     right lumpectomy, benign  . CHOLECYSTECTOMY N/A 12/27/2013   Procedure: LAPAROSCOPIC CHOLECYSTECTOMY ;  Surgeon: Scherry Ran, MD;  Location: [facility];  Service: General;  Laterality: N/A;    Family History  Problem Relation Age of Onset  . Depression Mother   . Alcohol abuse Mother   . Depression Brother   . Sickle cell anemia Brother   . Hypertension Brother   . Diabetes Father   . COPD Father   . Anxiety disorder Maternal Aunt     Social History   Tobacco Use  . Smoking status: Never Smoker  . Smokeless tobacco: Never Used  Substance Use  Topics  . Alcohol use: Not Currently    Alcohol/week: 0.0 standard drinks    Comment: occ. wine a glass  . Drug use: No    Allergies:  Allergies  Allergen Reactions  . Janumet [Sitagliptin-Metformin Hcl] Nausea And Vomiting    No medications prior to admission.    Review of Systems  Constitutional: Negative for chills and fever.  Gastrointestinal: Negative for diarrhea, nausea and vomiting.  Genitourinary: Positive for pelvic pain and vaginal bleeding. Negative for dysuria and frequency.   Physical Exam   Blood pressure (!) 176/81, pulse 73, temperature 98 F (36.7 C), temperature source Axillary, resp. rate 18, weight 57.1 kg, last menstrual period 07/25/2018.  Physical Exam  Nursing note and vitals reviewed. Constitutional: She is oriented to person, place, and time. She appears well-developed and well-nourished. No distress.  HENT:  Head: Normocephalic.  Cardiovascular: Normal rate.  GI: Soft. There is no abdominal tenderness. There is no rebound.  Neurological: She is alert and oriented to person, place, and time.  Skin: Skin is warm.  Psychiatric: She has a normal mood and affect.     Results for orders placed or performed during the hospital encounter of 11/04/18 (  from the past 24 hour(s))  Urinalysis, Routine w reflex microscopic     Status: Abnormal   Collection Time: 11/04/18  7:47 PM  Result Value Ref Range   Color, Urine YELLOW YELLOW   APPearance HAZY (A) CLEAR   Specific Gravity, Urine 1.012 1.005 - 1.030   pH 5.0 5.0 - 8.0   Glucose, UA >=500 (A) NEGATIVE mg/dL   Hgb urine dipstick LARGE (A) NEGATIVE   Bilirubin Urine NEGATIVE NEGATIVE   Ketones, ur NEGATIVE NEGATIVE mg/dL   Protein, ur 30 (A) NEGATIVE mg/dL   Nitrite NEGATIVE NEGATIVE   Leukocytes,Ua SMALL (A) NEGATIVE   RBC / HPF >50 (H) 0 - 5 RBC/hpf   WBC, UA 21-50 0 - 5 WBC/hpf   Bacteria, UA NONE SEEN NONE SEEN   Squamous Epithelial / LPF 0-5 0 - 5   Mucus PRESENT    Hyaline Casts, UA  PRESENT   Type and screen [facility]     Status: None (Preliminary result)   Collection Time: 11/04/18 11:36 PM  Result Value Ref Range   ABO/RH(D) B POS    Antibody Screen PENDING    Sample Expiration      11/07/2018,2359 Performed at [facility], [address] [place], [place] [id number]    [place] [facility] Transvaginal  Result Date: 11/04/2018 CLINICAL DATA:  Pelvic pain and vaginal bleeding in 1st trimester pregnancy. EXAM: OBSTETRIC <14 WK US AND TRANSVAGINAL OB US TECHNIQUE: Both transabdominal and transvaginal ultrasound examinations were performed for complete evaluation of the gestation as well as the maternal uterus, adnexal regions, and pelvic cul-de-sac. Transvaginal technique was performed to assess early pregnancy. COMPARISON:  [facility]2018 FINDINGS: Intrauterine gestational sac: Single; irregular sac shape noted Yolk sac:  Not Visualized. Embryo:  Visualized. Cardiac Activity: Not Visualized. CRL:  6 mm   6 w   3 d Subchorionic hemorrhage:  None visualized. Maternal uterus/adnexae: Normal appearance of both ovaries. No mass or abnormal free fluid identified. IMPRESSION: Single IUP shows no cardiac activity or significant progression since prior study. Findings meet definitive criteria for failed pregnancy. This follows SRU consensus guidelines: Diagnostic Criteria for Nonviable Pregnancy Early in the First Trimester. Macy Mis Engl J Med [id number];369:1443-51. Electronically Signed   By: Danae OrleansJohn A Stahl M.D.   On: 11/04/2018 21:22   MAU Course  Procedures  MDM Reviewed results with the patient. Offered expectant management v cytotec. She would like to proceed with cytotec. She has used this before for a prior miscarriage. Reviewed instructions again today, and answered all questions. Will send message for FT to see patient again in about 2 weeks.  Assessment and Plan   1. Missed abortion   2. [redacted] weeks gestation of pregnancy    B+ blood  type   DC home Comfort measures reviewed  Bleeding precautions RX: cytotec 800mcg X 1, Oxycodone PRN #20, Zofran PRN #20  Return to MAU as needed FU with OB as planned  Follow-up Information    [facility] Follow up in 2 week(s).   Specialty: Obstetrics and Gynecology Contact information: [address] [place] [id number] [phone]

## 2018-11-04 NOTE — Assessment & Plan Note (Signed)
Uncontrolled pregnant diabetic. Needs to be managed by Endo. Advised to d/c glipizide and appt with Endo in 1 day

## 2018-11-04 NOTE — Assessment & Plan Note (Signed)
Controlled but need to change to medication appropriate for use in pregnancy. Start aldomet twice daily. Ob to follow

## 2018-11-04 NOTE — MAU Note (Signed)
Pt reports to MAU c/o vaginal bleeding and abdominal cramping. Pt states this started yesterday and the bleeding has progressed. Pt states she has filled up a panty liner today and passed a clot. Pt states the cramping has been ongoing but worse today. Pt states the pain is a 7/10 on the pain scale and it comes and goes. Pt states she had sex 2 days ago. Pt states when she "felt her cervix" it felt super low.

## 2018-11-05 LAB — TYPE AND SCREEN
ABO/RH(D): B POS
Antibody Screen: NEGATIVE

## 2018-11-06 ENCOUNTER — Other Ambulatory Visit: Payer: Self-pay

## 2018-11-06 DIAGNOSIS — Z20822 Contact with and (suspected) exposure to covid-19: Secondary | ICD-10-CM

## 2018-11-06 DIAGNOSIS — Z20828 Contact with and (suspected) exposure to other viral communicable diseases: Secondary | ICD-10-CM

## 2018-11-06 LAB — ABO/RH: ABO/RH(D): B POS

## 2018-11-06 NOTE — Telephone Encounter (Signed)
Noted  

## 2018-11-09 ENCOUNTER — Telehealth: Payer: Self-pay | Admitting: *Deleted

## 2018-11-09 ENCOUNTER — Other Ambulatory Visit: Payer: Self-pay

## 2018-11-09 ENCOUNTER — Ambulatory Visit (INDEPENDENT_AMBULATORY_CARE_PROVIDER_SITE_OTHER): Payer: No Typology Code available for payment source | Admitting: "Endocrinology

## 2018-11-09 ENCOUNTER — Encounter: Payer: Self-pay | Admitting: "Endocrinology

## 2018-11-09 DIAGNOSIS — E1165 Type 2 diabetes mellitus with hyperglycemia: Secondary | ICD-10-CM

## 2018-11-09 LAB — NOVEL CORONAVIRUS, NAA: SARS-CoV-2, NAA: NOT DETECTED

## 2018-11-09 MED ORDER — GLIPIZIDE ER 5 MG PO TB24: 5.0000 mg | ORAL_TABLET | Freq: Every day | ORAL | 3 refills | Status: DC

## 2018-11-09 NOTE — Telephone Encounter (Signed)
Octaviano Glow, [facility] Program Manager notified of negative COVID-19 results. Understanding verbalized.

## 2018-11-09 NOTE — Progress Notes (Signed)
Endocrinology Consult Note       11/09/2018, 4:46 PM   Subjective:    Patient ID: [name] Fernandez, female    DOB: 1975/12/21.  [name] Fernandez is being seen in consultation for management of currently uncontrolled symptomatic type 2 diabetes diagnosed at approximate age of 43 years, currently pregnant at [redacted] weeks with her fourth child.   PMD:   Kerri PerchesSimpson, Margaret E, MD.   Past Medical History:  Diagnosis Date  . Anxiety   . Arthritis   . Chlamydia infection 03/15/2016  . Depression   . Diabetes mellitus   . GERD (gastroesophageal reflux disease)   . Headache(784.0)   . Hyperlipidemia   . Hypertension     Past Surgical History:  Procedure Laterality Date  . BREAST SURGERY     right lumpectomy, benign  . CHOLECYSTECTOMY N/A 12/27/2013   Procedure: LAPAROSCOPIC CHOLECYSTECTOMY ;  Surgeon: Marlane HatcherWilliam S Bradford, MD;  Location: [facility];  Service: General;  Laterality: N/A;    Social History   Socioeconomic History  . Marital status: Divorced    Spouse name: Not on file  . Number of children: 3  . Years of education: Not on file  . Highest education level: Not on file  Occupational History  . Not on file  Social Needs  . Financial resource strain: Not on file  . Food insecurity    Worry: Not on file    Inability: Not on file  . Transportation needs    Medical: Not on file    Non-medical: Not on file  Tobacco Use  . Smoking status: Never Smoker  . Smokeless tobacco: Never Used  Substance and Sexual Activity  . Alcohol use: Not Currently    Alcohol/week: 0.0 standard drinks    Comment: occ. wine a glass  . Drug use: No  . Sexual activity: Yes    Birth control/protection: None  Lifestyle  . Physical activity    Days per week: Not on file    Minutes per session: Not on file  . Stress: Not on file  Relationships  . Social Musicianconnections    Talks on phone: Not on file    Gets together: Not  on file    Attends religious service: Not on file    Active member of club or organization: Not on file    Attends meetings of clubs or organizations: Not on file    Relationship status: Not on file  Other Topics Concern  . Not on file  Social History Narrative  . Not on file    Family History  Problem Relation Age of Onset  . Depression Mother   . Alcohol abuse Mother   . Depression Brother   . Sickle cell anemia Brother   . Hypertension Brother   . Diabetes Father   . COPD Father   . Anxiety disorder Maternal Aunt     Outpatient Encounter Medications as of 11/09/2018  Medication Sig  . Insulin Glargine (LANTUS) 100 UNIT/ML Solostar Pen Inject 30 Units into the skin at bedtime.  Marland Kitchen. FLUoxetine (PROZAC) 20 MG capsule Take 1 capsule (20 mg total) by mouth  daily.  . folic acid (FOLVITE) 0.5 MG tablet Take 0.5 mg by mouth daily.  Marland Kitchen. glipiZIDE (GLUCOTROL XL) 5 MG 24 hr tablet Take 1 tablet (5 mg total) by mouth daily with breakfast.  . ibuprofen (ADVIL) 800 MG tablet Take 1 tablet (800 mg total) by mouth every 8 (eight) hours as needed.  . methyldopa (ALDOMET) 250 MG tablet Take one tablet by mouth two times daily for blood pressure, 12 hours apart  . misoprostol (CYTOTEC) 200 MCG tablet Take 4 tablets (800 mcg total) by mouth once for 1 dose.  . Multiple Vitamin (MULTIVITAMIN) capsule Take 1 capsule by mouth daily.  . ondansetron (ZOFRAN ODT) 8 MG disintegrating tablet Take 1 tablet (8 mg total) by mouth every 8 (eight) hours as needed for nausea or vomiting.  Marland Kitchen. oxyCODONE-acetaminophen (PERCOCET/ROXICET) 5-325 MG tablet Take 1-2 tablets by mouth every 6 (six) hours as needed for severe pain.  . [DISCONTINUED] Insulin Glargine (LANTUS) 100 UNIT/ML Solostar Pen Inject 15 Units into the skin daily at 10 pm.  . [DISCONTINUED] Insulin NPH, Human,, Isophane, (HUMULIN N KWIKPEN) 100 UNIT/ML Kiwkpen Inject 20 units daily at 8AM with breakfast and 10 units at bed time ( 8-10PM).    Facility-Administered Encounter Medications as of 11/09/2018  Medication  . pramoxine-hydrocortisone (PROCTOCREAM-HC) rectal cream    ALLERGIES: Allergies  Allergen Reactions  . Janumet [Sitagliptin-Metformin Hcl] Nausea And Vomiting    VACCINATION STATUS: Immunization History  Administered Date(s) Administered  . Influenza,inj,Quad PF,6+ Mos 02/06/2014, 01/16/2018  . PPD Test 09/30/2015, 10/21/2015, 09/14/2016  . Pneumococcal Polysaccharide-23 08/25/2009  . Tdap 06/23/2011    Diabetes She presents for her follow-up diabetic visit. She has type 2 diabetes mellitus. Onset time: She was diagnosed at approximate age of 43 years. Her disease course has been worsening. There are no hypoglycemic associated symptoms. Pertinent negatives for hypoglycemia include no confusion, headaches, pallor or seizures. Associated symptoms include fatigue, polydipsia and polyuria. Pertinent negatives for diabetes include no chest pain and no polyphagia. There are no hypoglycemic complications. Symptoms are worsening. Risk factors for coronary artery disease include dyslipidemia, diabetes mellitus and family history. Her weight is increasing steadily. She is following a generally unhealthy diet. When asked about meal planning, she reported none. She has not had a previous visit with a dietitian. She rarely participates in exercise. (Her recent A1c was 10.5%.) An ACE inhibitor/angiotensin II receptor blocker is not being taken. Eye exam is current.  Hyperlipidemia This is a chronic problem. The current episode started more than 1 year ago. The problem is uncontrolled. Exacerbating diseases include diabetes. Pertinent negatives include no chest pain, myalgias or shortness of breath. She is currently on no antihyperlipidemic treatment. Risk factors for coronary artery disease include dyslipidemia, diabetes mellitus, family history and hypertension.  Hypertension This is a chronic problem. The problem is controlled.  Pertinent negatives include no chest pain, headaches, palpitations or shortness of breath. Risk factors for coronary artery disease include diabetes mellitus and dyslipidemia. Past treatments include nothing.     Review of Systems  Constitutional: Positive for fatigue. Negative for chills, fever and unexpected weight change.  HENT: Negative for trouble swallowing and voice change.   Eyes: Negative for visual disturbance.  Respiratory: Negative for cough, shortness of breath and wheezing.   Cardiovascular: Negative for chest pain, palpitations and leg swelling.  Gastrointestinal: Negative for diarrhea, nausea and vomiting.  Endocrine: Positive for polydipsia and polyuria. Negative for cold intolerance, heat intolerance and polyphagia.  Musculoskeletal: Negative for arthralgias and myalgias.  Skin: Negative for color change, pallor, rash and wound.  Neurological: Negative for seizures and headaches.  Psychiatric/Behavioral: Negative for confusion and suicidal ideas.    Objective:    LMP 07/25/2018 (LMP Unknown)   Wt Readings from Last 3 Encounters:  11/04/18 125 lb 14.4 oz (57.1 kg)  11/02/18 126 lb (57.2 kg)  11/01/18 124 lb 1.9 oz (56.3 kg)        CMP ( most recent) CMP     Component Value Date/Time   NA 135 10/30/2018 1150   K 3.9 10/30/2018 1150   CL 102 10/30/2018 1150   CO2 24 10/30/2018 1150   GLUCOSE 111 10/30/2018 1150   BUN 8 10/30/2018 1150   CREATININE 0.69 10/30/2018 1150   CALCIUM 9.7 10/30/2018 1150   PROT 7.4 10/30/2018 1150   ALBUMIN 3.5 (L) 06/04/2016 1053   AST 12 10/30/2018 1150   ALT 9 10/30/2018 1150   ALKPHOS 67 06/04/2016 1053   BILITOT 0.3 10/30/2018 1150   GFRNONAA 109 10/30/2018 1150   GFRAA 126 10/30/2018 1150     Diabetic Labs (most recent): Lab Results  Component Value Date   HGBA1C 10.5 (H) 10/30/2018   HGBA1C 12.1 (H) 06/04/2016   HGBA1C 13.3 (H) 10/22/2015     Lipid Panel ( most recent) Lipid Panel     Component Value  Date/Time   CHOL 270 (H) 10/30/2018 1150   TRIG 255 (H) 10/30/2018 1150   HDL 39 (L) 10/30/2018 1150   CHOLHDL 6.9 (H) 10/30/2018 1150   VLDL 39 (H) 10/22/2015 0928   LDLCALC 185 (H) 10/30/2018 1150      Lab Results  Component Value Date   TSH 1.09 10/22/2015   TSH 0.991 01/31/2013   TSH 1.225 06/17/2011          Assessment & Plan:   1. Uncontrolled type 2 diabetes mellitus with hyperglycemia (HCC)  -She recently had miscarriage of 9 weeks pregnancy. - [name] Fernandez has currently uncontrolled symptomatic type 2 DM since  43 years of age,  with most recent A1c of 10.5 %. Recent labs reviewed.  -She does not report gross complications from her diabetes, however, she remains at extremely high high risk for more acute and chronic complications which include CAD, CVA, CKD, retinopathy, and neuropathy. These are all discussed in detail with her.  - I have counseled her on diet management  by adopting a carbohydrate restricted/protein rich diet.  - she  admits there is a room for improvement in her diet and drink choices. -  Suggestion is made for her to avoid simple carbohydrates  from her diet including Cakes, Sweet Desserts / Pastries, Ice Cream, Soda (diet and regular), Sweet Tea, Candies, Chips, Cookies, Sweet Pastries,  Store Bought Juices, Alcohol in Excess of  1-2 drinks a day, Artificial Sweeteners, Coffee Creamer, and "Sugar-free" Products. This will help patient to have stable blood glucose profile and potentially avoid unintended weight gain.  - I encouraged her to switch to  unprocessed or minimally processed complex starch and increased protein intake (animal or plant source), fruits, and vegetables.  - she is advised to stick to a routine mealtimes to eat 3 meals  a day and avoid unnecessary snacks ( to snack only to correct hypoglycemia).   - I have approached her with the following individualized plan to manage diabetes and patient agrees:   -Given her current  glycemic burden, she will likely require intensive treatment with basal/bolus insulin in order for her to achieve  control of diabetes to target.   -In preparation, she is approached to start monitoring blood glucose 4 times a day- before meals and at bedtime, resume Lantus 30 units nightly. -She is advised to resume her glipizide 5 mg XL p.o. daily with breakfast.  - she is encouraged to call clinic for blood glucose levels less than 70 or above 200 mg /dl. -If he returns with significant hyperglycemia, she will be considered for prandial insulin such as NovoLog.  - Patient specific target  A1c;  LDL, HDL, Triglycerides, and  Waist Circumference were discussed in detail.  2) Blood Pressure /Hypertension:  she is advised to home monitor blood pressure and report if > 140/90 on 2 separate readings.   she is currently on methyldopa, however will benefit from ARB  Or ACEi , will be considered next visit.  3) Lipids/Hyperlipidemia:   Review of her recent lipid panel showed uncontrolled  LDL at 185 .  she  is not a candidate for statin therapy while she is pregnant.  Reportedly she is insulin is statin intolerant, will be considered for Zetia 10 mg daily, her next visit.   4)  Weight/Diet:  She is not a weight loss candidate. - CDE Consult has been  initiated . Exercise, and detailed carbohydrates information provided  -  detailed on discharge instructions.  5) Chronic Care/Health Maintenance:  -she   is encouraged to initiate and continue to follow up with Ophthalmology, Dentist,  Podiatrist at least yearly or according to recommendations, and advised to  stay away from smoking. I have recommended yearly flu vaccine and pneumonia vaccine at least every 5 years; moderate intensity exercise for up to 150 minutes weekly; and  sleep for at least 7 hours a day.  - she is  advised to maintain close follow up with Fayrene Helper, MD for primary care needs, as well as her other providers for optimal  and coordinated care.  - Patient Care Time Today:  25 min, of which >50% was spent in reviewing her  current and  previous labs/studies, previous treatments, and medications doses and developing a plan for long-term care based on the latest recommendations for standards of care.  Gabriela Eves participated in the discussions, expressed understanding, and voiced agreement with the above plans.  All questions were answered to her satisfaction. she is encouraged to contact clinic should she have any questions or concerns prior to her return visit.   Follow up plan: - Return in about 3 weeks (around 11/30/2018) for Follow up with Meter and Logs Only - no Labs.  Glade Lloyd, MD [facility] [address] [place], [place] [address] Phone: [phone]  Fax: [phone]    11/09/2018, 4:46 PM  This note was partially dictated with voice recognition software. Similar sounding words can be transcribed inadequately or may not  be corrected upon review.

## 2018-11-20 ENCOUNTER — Telehealth: Payer: Self-pay

## 2018-11-20 NOTE — Telephone Encounter (Signed)
[facility] - Patient reports that she is still at work and not able to talk.

## 2018-11-23 ENCOUNTER — Other Ambulatory Visit: Payer: Self-pay | Admitting: Obstetrics and Gynecology

## 2018-11-23 DIAGNOSIS — Z3682 Encounter for antenatal screening for nuchal translucency: Secondary | ICD-10-CM

## 2018-11-24 ENCOUNTER — Telehealth: Payer: Self-pay | Admitting: Women's Health

## 2018-11-24 ENCOUNTER — Encounter: Payer: Self-pay | Admitting: *Deleted

## 2018-11-24 ENCOUNTER — Telehealth: Payer: Self-pay | Admitting: *Deleted

## 2018-11-24 DIAGNOSIS — E785 Hyperlipidemia, unspecified: Secondary | ICD-10-CM

## 2018-11-24 DIAGNOSIS — E1165 Type 2 diabetes mellitus with hyperglycemia: Secondary | ICD-10-CM

## 2018-11-24 NOTE — Telephone Encounter (Signed)
Patient states she had a miscarriage in early July and needs f/u bloodwork.  Appt scheduled for hcg on Monday.  Pt to go to [facility] in [place].

## 2018-11-24 NOTE — Telephone Encounter (Signed)
Pt states she went to [facility] and she had a miscarriage. Pt states she is needing to come in for a follow up and HCG test and is wanting to know when she should come in for that.

## 2018-11-24 NOTE — Telephone Encounter (Signed)
Pt called wanting to know if Dr.Simpson would go ahead and start her back on her cholesterol medication as she was taken off of it due to being pregnant however she had a miscarriage and would like to go ahead and get started back on the medication.

## 2018-11-27 ENCOUNTER — Telehealth: Payer: Self-pay

## 2018-11-27 ENCOUNTER — Other Ambulatory Visit: Payer: No Typology Code available for payment source

## 2018-11-27 NOTE — Telephone Encounter (Signed)
2nd attempt - [facility]   Patient reports that she is at work and she will call me back when she gets home.

## 2018-11-28 ENCOUNTER — Other Ambulatory Visit: Payer: Self-pay

## 2018-11-28 ENCOUNTER — Other Ambulatory Visit: Payer: No Typology Code available for payment source

## 2018-11-28 NOTE — Telephone Encounter (Signed)
Wants to be started back on her cholesterol med since she had a miscarriage

## 2018-11-28 NOTE — Telephone Encounter (Signed)
Pl s send in Crestor 40 mg daily #90 refill 1 , I will sign, needs OV with me in 3.5  months, with fasting lipid, cmp and eGFr 1 week prior to visit, pls arrange thanks  Let her know I am sorry about the miscarriage also please

## 2018-11-29 MED ORDER — ROSUVASTATIN CALCIUM 40 MG PO TABS: 40.0000 mg | ORAL_TABLET | Freq: Every day | ORAL | 1 refills | Status: DC
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
## 2018-11-29 NOTE — Addendum Note (Signed)
Addended by: Eual Fines on: 11/29/2018 11:46 AM   Modules accepted: Orders

## 2018-11-29 NOTE — Telephone Encounter (Signed)
Med sent and message left to call back for a  3 month follow up and to do labs before the visit

## 2018-11-30 LAB — HCG, QUANTITATIVE, PREGNANCY: HCG, Total, QN: <3 m[IU]/mL

## 2018-12-01 ENCOUNTER — Ambulatory Visit: Payer: No Typology Code available for payment source | Admitting: *Deleted

## 2018-12-01 ENCOUNTER — Encounter: Payer: No Typology Code available for payment source | Admitting: Women's Health

## 2018-12-04 ENCOUNTER — Other Ambulatory Visit: Payer: No Typology Code available for payment source

## 2018-12-05 ENCOUNTER — Telehealth: Payer: Self-pay

## 2018-12-05 DIAGNOSIS — F322 Major depressive disorder, single episode, severe without psychotic features: Secondary | ICD-10-CM

## 2018-12-05 NOTE — BH Specialist Note (Signed)
[name] Fernandez  MRN: [id number] NAME: [name] Fernandez Date: 08-Dec-2018  Start time: Start Time: 1500 End time: Stop Time: 7062 Total time: Total Time in Minutes (Visit): 34 Call number:     Reason for call today: Reason for Contact: PHQ9-2 weeks    PHQ-9 Scores:  Depression screen [facility] 2/9 2018/12/08 2018-12-08 11/01/2018 11/01/2018  Decreased Interest 3 3 1  0  Down, Depressed, Hopeless 3 3 1  0  PHQ - 2 Score 6 6 2  0  Altered sleeping 2 3 3  -  Tired, decreased energy 2 2 3  -  Change in appetite 2 1 3  -  Feeling bad or failure about yourself  3 - 1 -  Trouble concentrating 1 - 2 -  Moving slowly or fidgety/restless 1 - 3 -  Suicidal thoughts - - 0 -  PHQ-9 Score 17 12 17  -  Difficult doing work/chores Very difficult - Somewhat difficult -  Some encounter information is confidential and restricted. Go to Review Flowsheets activity to see all data.     GAD-7 Scores:  GAD 7 : Generalized Anxiety Score 2018/12/08 11/01/2018  Nervous, Anxious, on Edge 3 2  Control/stop worrying 3 2  Worry too much - different things 2 2  Trouble relaxing 2 2  Restless 1 2  Easily annoyed or irritable 1 1  Afraid - awful might happen 2 3  Total GAD 7 Score 14 14  Anxiety Difficulty Somewhat difficult Somewhat difficult     Stress Current stressors: Current Stressors: Other (Comment)(She had a miscarriage.) Sleep: Sleep: Decreased, Difficulty falling asleep, Difficulty staying asleep Appetite: Appetite: No problems Coping ability: Coping ability: Deficient support system, Exhausted, Overwhelmed Patient taking medications as prescribed: Patient taking medications as prescribed: Yes     Current medications:  Outpatient Encounter Medications as of December 08, 2018  Medication Sig  . FLUoxetine (PROZAC) 20 MG capsule Take 1 capsule (20 mg total) by mouth daily.  . folic acid (FOLVITE) 0.5 MG tablet Take 0.5 mg by mouth daily.  Marland Kitchen glipiZIDE (GLUCOTROL XL) 5 MG 24 hr tablet  Take 1 tablet (5 mg total) by mouth daily with breakfast.  . ibuprofen (ADVIL) 800 MG tablet Take 1 tablet (800 mg total) by mouth every 8 (eight) hours as needed.  . Insulin Glargine (LANTUS) 100 UNIT/ML Solostar Pen Inject 30 Units into the skin at bedtime.  . methyldopa (ALDOMET) 250 MG tablet Take one tablet by mouth two times daily for blood pressure, 12 hours apart  . misoprostol (CYTOTEC) 200 MCG tablet Take 4 tablets (800 mcg total) by mouth once for 1 dose.  . Multiple Vitamin (MULTIVITAMIN) capsule Take 1 capsule by mouth daily.  . ondansetron (ZOFRAN ODT) 8 MG disintegrating tablet Take 1 tablet (8 mg total) by mouth every 8 (eight) hours as needed for nausea or vomiting.  Marland Kitchen oxyCODONE-acetaminophen (PERCOCET/ROXICET) 5-325 MG tablet Take 1-2 tablets by mouth every 6 (six) hours as needed for severe pain.  . rosuvastatin (CRESTOR) 40 MG tablet Take 1 tablet (40 mg total) by mouth daily.   Facility-Administered Encounter Medications as of 12-08-18  Medication  . pramoxine-hydrocortisone (PROCTOCREAM-HC) rectal cream     Self-harm Behaviors Risk Assessment Self-harm risk factors:  None Reported Patient endorses recent thoughts of harming self: Have you recently had any thoughts about harming yourself?: No     [place] Suicide Severity Rating Scale: No flowsheet data found. C-SRSS 11/01/2018 December 08, 2018  1. Wish to be Dead No No  2. Suicidal Thoughts No No  6. Suicide Behavior Question No No     Danger to Others Risk Assessment Danger to others risk factors: Danger to Others Risk Factors: No risk factors noted Patient endorses recent thoughts of harming others: Notification required: No need or identified person    Substance Use Assessment: Patient recently consumed alcohol:  None reported Patient recently used drugs:  None Reported    Goals, Interventions and Fernandez Plan Goals: Increase healthy adjustment to current life circumstances Interventions: Behavioral  Activation, Supportive Counseling and Sleep Hygiene Fernandez Plan: Appt wtih Dr. Vanetta ShawlHisada on 12-06-2018  Summary:   Patient is a 43 year old female.  Patient PHQ and GAD remains the same.   Supportive Counseling and Behavior Activation:  Patient reports that she had a miscarriage in July 2020.  Patient reports that her elevated blood sugar levels may have been a causative factor with her miscarriage.  Writer actively listened as the patient vented her feelings of depression.  Writer discussed the stages of grief.  Writer validated patient feelings.  Patient reports that she is hopeful because her doctor told her that she is able to try again to have another child.  Patient reports that she wants to be healthier before she begins to try to have another child.   Writer praised patient for being able to follow up on scheduling appointments for her children to receive mental health services due to their behavioral health issues at home.    Patient reports that she is concerned because her 41 year old daughter has to watch her 1653yr old daughter that is autistic.    Writer discussed sleep hygiene techniques.  Patient reports that she her 41yo autistic daughter has been yelling and screaming at night before bed time causing her to have poor sleep.     Medication:  [name] Fernandez has been compliant with taking Prozoac 20mg .  [name] Fernandez denies any negative side effects   Patient denies SI/HI/Psychosis/Substance Abuse. If your symptoms worsen or you have thoughts of suicide/homicide, PLEASE SEEK IMMEDIATE MEDICAL ATTENTION.  You may always call:  National Suicide Hotline: (817)506-5441918 310 7143; [place] Crisis Line: 618 306 1484231-179-7247; Crisis Recovery in [place]: [phone]) [phone].  These are available 24 hours a day, 7 days a week.   During the next session:   - [name] Fernandez will continue to follow up with the referral for her daughters to be seen by a  therapist and a psychiatrist  - Patient has a scheduled appointment with  Dr. Vanetta ShawlHisada on 12-06-18.  - [name] Fernandez will follow up with her endocrinologist - [name] Fernandez will contact her nutritionist due to her elevated levels with her diabetes.       Phillip HealStevenson, [name] Fernandez, LCAS-A

## 2018-12-06 ENCOUNTER — Telehealth (HOSPITAL_COMMUNITY): Payer: Self-pay | Admitting: Psychiatry

## 2018-12-06 ENCOUNTER — Ambulatory Visit (INDEPENDENT_AMBULATORY_CARE_PROVIDER_SITE_OTHER): Payer: No Typology Code available for payment source | Admitting: Psychiatry

## 2018-12-06 ENCOUNTER — Other Ambulatory Visit: Payer: Self-pay

## 2018-12-06 ENCOUNTER — Encounter (HOSPITAL_COMMUNITY): Payer: Self-pay | Admitting: Psychiatry

## 2018-12-06 ENCOUNTER — Ambulatory Visit: Payer: No Typology Code available for payment source | Admitting: "Endocrinology

## 2018-12-06 DIAGNOSIS — F411 Generalized anxiety disorder: Secondary | ICD-10-CM | POA: Diagnosis not present

## 2018-12-06 DIAGNOSIS — F330 Major depressive disorder, recurrent, mild: Secondary | ICD-10-CM | POA: Diagnosis not present

## 2018-12-06 NOTE — Progress Notes (Signed)
She was seen by this note Probation officer, and will be scheduled with a therapist in the clinic. Will plan to sign off. [facility] specialist to continue to follow this patient until her appointment with a therapist.

## 2018-12-06 NOTE — Patient Instructions (Signed)
1. Continue fluoxetine 20 mg daily 2. Referral to therapy  3. Next appointment: 9/10 at 3:50

## 2018-12-06 NOTE — Telephone Encounter (Signed)
Sent link for video visit through [facility]. Patient did not sign in. Called the patient  twice for appointment scheduled today. The patient did not answer the phone. Left voice message to contact the office.  

## 2018-12-19 ENCOUNTER — Telehealth: Payer: Self-pay

## 2018-12-19 NOTE — Telephone Encounter (Signed)
Patient has been transitioned to care with Dr Modesta Messing.  [facility] left message to follow up with patient.
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
## 2019-01-18 ENCOUNTER — Other Ambulatory Visit: Payer: Self-pay

## 2019-01-18 ENCOUNTER — Ambulatory Visit (INDEPENDENT_AMBULATORY_CARE_PROVIDER_SITE_OTHER): Payer: No Typology Code available for payment source | Admitting: Psychiatry

## 2019-01-18 ENCOUNTER — Encounter (HOSPITAL_COMMUNITY): Payer: Self-pay | Admitting: Psychiatry

## 2019-01-18 DIAGNOSIS — F411 Generalized anxiety disorder: Secondary | ICD-10-CM

## 2019-01-18 DIAGNOSIS — F330 Major depressive disorder, recurrent, mild: Secondary | ICD-10-CM | POA: Diagnosis not present

## 2019-01-18 MED ORDER — FLUOXETINE HCL 40 MG PO CAPS: 40.0000 mg | ORAL_CAPSULE | Freq: Every day | ORAL | 1 refills | Status: DC

## 2019-01-18 NOTE — Patient Instructions (Signed)
1. Increase fluoxetine 40 mg daily 2. Referral to therapy  3. Next appointment: 10/21 at 9 AM
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
## 2019-02-28 ENCOUNTER — Other Ambulatory Visit: Payer: Self-pay

## 2019-02-28 ENCOUNTER — Ambulatory Visit (HOSPITAL_COMMUNITY): Payer: No Typology Code available for payment source | Admitting: Psychiatry

## 2019-04-04 ENCOUNTER — Ambulatory Visit: Payer: Self-pay | Admitting: Family Medicine

## 2019-04-17 ENCOUNTER — Telehealth: Payer: Self-pay

## 2019-05-25 DIAGNOSIS — Z113 Encounter for screening for infections with a predominantly sexual mode of transmission: Secondary | ICD-10-CM | POA: Diagnosis not present

## 2019-10-26 ENCOUNTER — Telehealth (HOSPITAL_COMMUNITY): Payer: Self-pay | Admitting: *Deleted

## 2019-10-26 ENCOUNTER — Encounter (HOSPITAL_COMMUNITY): Payer: Self-pay | Admitting: *Deleted

## 2019-10-26 NOTE — Telephone Encounter (Signed)
Noted, patient scheduled appt

## 2019-10-26 NOTE — Telephone Encounter (Signed)
Patient called and [facility] stating she is needing refills for her medication but did not leave the names of the medications she is needing refills for. Patient last appointment with provider was 01-18-2019. Patient stated

## 2019-10-26 NOTE — Telephone Encounter (Signed)
Noted, thanks!
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
## 2019-11-06 ENCOUNTER — Other Ambulatory Visit: Payer: Self-pay

## 2019-11-06 ENCOUNTER — Encounter (HOSPITAL_COMMUNITY): Payer: Self-pay | Admitting: Psychiatry

## 2019-11-06 ENCOUNTER — Telehealth (INDEPENDENT_AMBULATORY_CARE_PROVIDER_SITE_OTHER): Payer: BC Managed Care – PPO | Admitting: Psychiatry

## 2019-11-06 DIAGNOSIS — G4700 Insomnia, unspecified: Secondary | ICD-10-CM | POA: Diagnosis not present

## 2019-11-06 DIAGNOSIS — F330 Major depressive disorder, recurrent, mild: Secondary | ICD-10-CM | POA: Diagnosis not present

## 2019-11-06 MED ORDER — FLUOXETINE HCL 40 MG PO CAPS: 40.0000 mg | ORAL_CAPSULE | Freq: Every day | ORAL | 1 refills | Status: DC

## 2019-11-06 NOTE — Patient Instructions (Signed)
1. Reinitiate fluoxetine 40 mg daily 2. Referral to therapy  3. Next appointment: 8/17 at 1 PM
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
## 2019-12-25 ENCOUNTER — Other Ambulatory Visit: Payer: Self-pay

## 2019-12-25 ENCOUNTER — Telehealth (INDEPENDENT_AMBULATORY_CARE_PROVIDER_SITE_OTHER): Payer: BC Managed Care – PPO | Admitting: Psychiatry

## 2019-12-25 ENCOUNTER — Encounter (HOSPITAL_COMMUNITY): Payer: Self-pay | Admitting: Psychiatry

## 2019-12-25 DIAGNOSIS — F3341 Major depressive disorder, recurrent, in partial remission: Secondary | ICD-10-CM | POA: Diagnosis not present

## 2019-12-25 MED ORDER — FLUOXETINE HCL 40 MG PO CAPS: 40.0000 mg | ORAL_CAPSULE | Freq: Every day | ORAL | 0 refills | Status: DC

## 2019-12-25 NOTE — Patient Instructions (Signed)
1.Continuefluoxetine 40 mg daily 2. Next appointment:11/16 at 1 PM

## 2019-12-31 ENCOUNTER — Telehealth: Payer: Self-pay

## 2019-12-31 ENCOUNTER — Other Ambulatory Visit: Payer: Self-pay

## 2019-12-31 DIAGNOSIS — I10 Essential (primary) hypertension: Secondary | ICD-10-CM

## 2019-12-31 DIAGNOSIS — E1165 Type 2 diabetes mellitus with hyperglycemia: Secondary | ICD-10-CM

## 2019-12-31 DIAGNOSIS — Z708 Other sex counseling: Secondary | ICD-10-CM

## 2019-12-31 DIAGNOSIS — E785 Hyperlipidemia, unspecified: Secondary | ICD-10-CM

## 2019-12-31 DIAGNOSIS — Z114 Encounter for screening for human immunodeficiency virus [HIV]: Secondary | ICD-10-CM

## 2019-12-31 DIAGNOSIS — E559 Vitamin D deficiency, unspecified: Secondary | ICD-10-CM

## 2019-12-31 NOTE — Telephone Encounter (Signed)
Pt is scheduled for CPE w\pap on 9-16 @ 3pm, and would like to know if she needs blood work, and she wants to be tested for everything, even HIV

## 2019-12-31 NOTE — Telephone Encounter (Signed)
Pls order fasting lipid, cmp and EGFR, cBC, tSH, hBA1c, vit D,  microalb, HIV and hSV 2

## 2020-01-01 NOTE — Telephone Encounter (Signed)
Called patient to make her aware that her labs have been ordered for [facility]. No answer, no vm

## 2020-01-03 NOTE — Telephone Encounter (Signed)
Sent messge via [facility] with lab information and addresses for both sites.

## 2020-01-05 ENCOUNTER — Ambulatory Visit
Admission: EM | Admit: 2020-01-05 | Discharge: 2020-01-05 | Disposition: A | Discharge status: Home / Self Care | Payer: BC Managed Care – PPO | Attending: Emergency Medicine | Admitting: Emergency Medicine

## 2020-01-05 ENCOUNTER — Encounter: Payer: Self-pay | Admitting: Emergency Medicine

## 2020-01-05 DIAGNOSIS — Z8249 Family history of ischemic heart disease and other diseases of the circulatory system: Secondary | ICD-10-CM | POA: Insufficient documentation

## 2020-01-05 DIAGNOSIS — Z113 Encounter for screening for infections with a predominantly sexual mode of transmission: Secondary | ICD-10-CM | POA: Diagnosis not present

## 2020-01-05 DIAGNOSIS — Z79899 Other long term (current) drug therapy: Secondary | ICD-10-CM | POA: Insufficient documentation

## 2020-01-05 DIAGNOSIS — Z760 Encounter for issue of repeat prescription: Secondary | ICD-10-CM | POA: Diagnosis not present

## 2020-01-05 DIAGNOSIS — E785 Hyperlipidemia, unspecified: Secondary | ICD-10-CM | POA: Insufficient documentation

## 2020-01-05 DIAGNOSIS — N898 Other specified noninflammatory disorders of vagina: Secondary | ICD-10-CM | POA: Insufficient documentation

## 2020-01-05 DIAGNOSIS — E119 Type 2 diabetes mellitus without complications: Secondary | ICD-10-CM | POA: Diagnosis not present

## 2020-01-05 DIAGNOSIS — Z833 Family history of diabetes mellitus: Secondary | ICD-10-CM | POA: Insufficient documentation

## 2020-01-05 DIAGNOSIS — I10 Essential (primary) hypertension: Secondary | ICD-10-CM | POA: Diagnosis not present

## 2020-01-05 MED ORDER — LISINOPRIL-HYDROCHLOROTHIAZIDE 10-12.5 MG PO TABS: 1.0000 | ORAL_TABLET | Freq: Every day | ORAL | 0 refills | Status: DC

## 2020-01-05 MED ORDER — METRONIDAZOLE 500 MG PO TABS: 500.0000 mg | ORAL_TABLET | Freq: Two times a day (BID) | ORAL | 0 refills | Status: DC

## 2020-01-05 NOTE — ED Triage Notes (Signed)
Vaginal odor and thick white discharge, would like to be screened for std's

## 2020-01-05 NOTE — Discharge Instructions (Addendum)
Vaginal self-swab obtained.  We will follow up with you regarding abnormal results Prescribed metronidazole Take medications as prescribed and to completion If tests results are positive, please abstain from sexual activity until you and your partner(s) have been treated Follow up with PCP or [facility] if symptoms persists Return here or go to ER if you have any new or worsening symptoms fever, chills, nausea, vomiting, abdominal or pelvic pain, painful intercourse, vaginal discharge, vaginal bleeding, persistent symptoms despite treatment, etc..Marland Kitchen

## 2020-01-05 NOTE — ED Provider Notes (Addendum)
[facility]   [id number] 01/05/20 Arrival Time: 0849   SH:[id number] DISCHARGE  SUBJECTIVE:  [name] Fernandez is a 44 y.o. female who presents with a complaint of vaginal odor and vaginal discharge for the past few days.  She denies a precipitating event, recent sexual encounter or recent antibiotic use.  Patient is sexually active with 1 of female partners.  Describes discharge as thick /white.  She has tried OTC medications without relief.  Denies aggravating factors.  Denies similar symptoms in the past.  She denies fever, chills, nausea, vomiting, abdominal or pelvic pain, urinary symptoms, vaginal itching,  vaginal bleeding, dyspareunia, vaginal rashes or lesions.   Patient would like to have her blood pressure medication refill.   Patient's last menstrual period was 11/21/2019. Current birth control method: Compliant with BC:  ROS: As per HPI.  All other pertinent ROS negative.     Past Medical History:  Diagnosis Date   Anxiety    Arthritis    Chlamydia infection 03/15/2016   Depression    Diabetes mellitus    GERD (gastroesophageal reflux disease)    Headache(784.0)    Hyperlipidemia    Hypertension    Past Surgical History:  Procedure Laterality Date   BREAST SURGERY     right lumpectomy, benign   CHOLECYSTECTOMY N/A 12/27/2013   Procedure: LAPAROSCOPIC CHOLECYSTECTOMY ;  Surgeon: Marlane Hatcher, MD;  Location: [facility];  Service: General;  Laterality: N/A;   Allergies  Allergen Reactions   Janumet [Sitagliptin-Metformin Hcl] Nausea And Vomiting   Current Facility-Administered Medications on File Prior to Encounter  Medication Dose Route Frequency Provider Last Rate Last Admin   pramoxine-hydrocortisone (PROCTOCREAM-HC) rectal cream   Rectal TID Tilda Burrow, MD       Current Outpatient Medications on File Prior to Encounter  Medication Sig Dispense Refill   FLUoxetine (PROZAC) 20 MG capsule Take 1 capsule (20 mg total) by mouth  daily. 30 capsule 3   FLUoxetine (PROZAC) 40 MG capsule Take 1 capsule (40 mg total) by mouth daily. 90 capsule 0   folic acid (FOLVITE) 0.5 MG tablet Take 0.5 mg by mouth daily.     glipiZIDE (GLUCOTROL XL) 5 MG 24 hr tablet Take 1 tablet (5 mg total) by mouth daily with breakfast. 30 tablet 3   ibuprofen (ADVIL) 800 MG tablet Take 1 tablet (800 mg total) by mouth every 8 (eight) hours as needed. 30 tablet 0   Insulin Glargine (LANTUS) 100 UNIT/ML Solostar Pen Inject 30 Units into the skin at bedtime.     methyldopa (ALDOMET) 250 MG tablet Take one tablet by mouth two times daily for blood pressure, 12 hours apart 60 tablet 1   misoprostol (CYTOTEC) 200 MCG tablet Take 4 tablets (800 mcg total) by mouth once for 1 dose. 4 tablet 0   Multiple Vitamin (MULTIVITAMIN) capsule Take 1 capsule by mouth daily.     ondansetron (ZOFRAN ODT) 8 MG disintegrating tablet Take 1 tablet (8 mg total) by mouth every 8 (eight) hours as needed for nausea or vomiting. 20 tablet 0   oxyCODONE-acetaminophen (PERCOCET/ROXICET) 5-325 MG tablet Take 1-2 tablets by mouth every 6 (six) hours as needed for severe pain. 20 tablet 0   rosuvastatin (CRESTOR) 40 MG tablet Take 1 tablet (40 mg total) by mouth daily. 90 tablet 1    Social History   Socioeconomic History   Marital status: Divorced    Spouse name: Not on file   Number of children: 3  Years of education: Not on file   Highest education level: Not on file  Occupational History   Not on file  Tobacco Use   Smoking status: Never Smoker   Smokeless tobacco: Never Used  Substance and Sexual Activity   Alcohol use: Not Currently    Alcohol/week: 0.0 standard drinks    Comment: occ. wine a glass   Drug use: No   Sexual activity: Yes    Birth control/protection: None  Other Topics Concern   Not on file  Social History Narrative   Not on file   Social Determinants of Health   Financial Resource Strain:    Difficulty of Paying  Living Expenses: Not on file  Food Insecurity:    Worried About Programme researcher, broadcasting/film/video in the Last Year: Not on file   [employer] of Food in the Last Year: Not on file  Transportation Needs:    Lack of Transportation (Medical): Not on file   Lack of Transportation (Non-Medical): Not on file  Physical Activity:    Days of Exercise per Week: Not on file   Minutes of Exercise per Session: Not on file  Stress:    Feeling of Stress : Not on file  Social Connections:    Frequency of Communication with Friends and Family: Not on file   Frequency of Social Gatherings with Friends and Family: Not on file   Attends Religious Services: Not on file   Active Member of Clubs or Organizations: Not on file   Attends Banker Meetings: Not on file   Marital Status: Not on file  Intimate Partner Violence:    Fear of Current or Ex-Partner: Not on file   Emotionally Abused: Not on file   Physically Abused: Not on file   Sexually Abused: Not on file   Family History  Problem Relation Age of Onset   Depression Mother    Alcohol abuse Mother    Depression Brother    Sickle cell anemia Brother    Hypertension Brother    Diabetes Father    COPD Father    Anxiety disorder Maternal Aunt     OBJECTIVE:  Vitals:   01/05/20 0858 01/05/20 0900  BP:  (!) 186/116  Pulse:  93  Resp:  18  Temp:  98.6 F (37 C)  TempSrc:  Oral  SpO2:  98%  Weight: 116 lb (52.6 kg)   Height: 5' (1.524 m)      General appearance: Alert, NAD, appears stated age Head: NCAT Throat: lips, mucosa, and tongue normal; teeth and gums normal Lungs: CTA bilaterally without adventitious breath sounds Heart: regular rate and rhythm.  Radial pulses 2+ symmetrical bilaterally Back: no CVA tenderness Abdomen: soft, non-tender; bowel sounds normal; no masses or organomegaly; no guarding or rebound tenderness GU:  Cervical self swab obtained Skin: warm and dry Psychological:  Alert and  cooperative. Normal mood and affect.  LABS:  Results for orders placed or performed in visit on 11/27/18  B-HCG Quant  Result Value Ref Range   HCG, Total, QN <3 mIU/mL    Labs Reviewed  CERVICOVAGINAL ANCILLARY ONLY    ASSESSMENT & PLAN:  1. Vaginal odor   2. Vaginal discharge   3. Screening for STD (sexually transmitted disease)   4. Encounter for medication refill     Meds ordered this encounter  Medications   metroNIDAZOLE (FLAGYL) 500 MG tablet    Sig: Take 1 tablet (500 mg total) by mouth 2 (two) times daily.  Dispense:  14 tablet    Refill:  0   lisinopril-hydrochlorothiazide (ZESTORETIC) 10-12.5 MG tablet    Sig: Take 1 tablet by mouth daily.    Dispense:  30 tablet    Refill:  0    Pending: Labs Reviewed  CERVICOVAGINAL ANCILLARY ONLY   Patient is stable at discharge.  Will be treated for possible BV.  Metronidazole was prescribed   Discharge Instructions Vaginal self-swab obtained.  We will follow up with you regarding abnormal results Prescribed metronidazole take medications as prescribed and to completion If tests results are positive, please abstain from sexual activity until you and your partner(s) have been treated Follow up with PCP or [facility] if symptoms persists Return here or go to ER if you have any new or worsening symptoms fever, chills, nausea, vomiting, abdominal or pelvic pain, painful intercourse, vaginal discharge, vaginal bleeding, persistent symptoms despite treatment, etc...  Reviewed expectations re: course of current medical issues. Questions answered. Outlined signs and symptoms indicating need for more acute intervention. Patient verbalized understanding. After Visit Summary given.    Note: This document was prepared using Dragon voice recognition software and may include unintentional dictation errors.    Durward Parcel, FNP 01/05/20 0924    Durward Parcel, FNP 01/05/20 [phone]

## 2020-01-08 LAB — CERVICOVAGINAL ANCILLARY ONLY
Bacterial Vaginitis (gardnerella): POSITIVE — AB
Candida Glabrata: NEGATIVE
Candida Vaginitis: NEGATIVE
Chlamydia: NEGATIVE
Comment: NEGATIVE
Comment: NEGATIVE
Comment: NEGATIVE
Comment: NEGATIVE
Comment: NEGATIVE
Comment: NORMAL
Neisseria Gonorrhea: NEGATIVE
Trichomonas: NEGATIVE

## 2020-01-24 ENCOUNTER — Encounter: Payer: Self-pay | Admitting: Family Medicine
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
## 2020-03-25 ENCOUNTER — Other Ambulatory Visit: Payer: Self-pay

## 2020-03-25 ENCOUNTER — Telehealth (INDEPENDENT_AMBULATORY_CARE_PROVIDER_SITE_OTHER): Payer: BC Managed Care – PPO | Admitting: Psychiatry

## 2020-03-25 ENCOUNTER — Encounter: Payer: Self-pay | Admitting: Psychiatry

## 2020-03-25 ENCOUNTER — Telehealth (HOSPITAL_COMMUNITY): Payer: No Typology Code available for payment source | Admitting: Psychiatry

## 2020-03-25 DIAGNOSIS — F330 Major depressive disorder, recurrent, mild: Secondary | ICD-10-CM

## 2020-03-25 MED ORDER — FLUOXETINE HCL 20 MG PO CAPS: ORAL_CAPSULE | ORAL | 1 refills | Status: DC

## 2020-03-25 MED ORDER — FLUOXETINE HCL 40 MG PO CAPS: ORAL_CAPSULE | ORAL | 1 refills | Status: DC

## 2020-03-25 NOTE — Patient Instructions (Signed)
1.Increasefluoxetine 60 mg daily 2. Referred to sleep clinic  3. Next appointment:1/4 at 1 PM

## 2020-03-27 ENCOUNTER — Other Ambulatory Visit: Payer: Self-pay | Admitting: Family Medicine

## 2020-03-27 ENCOUNTER — Telehealth: Payer: Self-pay

## 2020-03-27 LAB — LIPID PANEL
Chol/HDL Ratio: 6.8 ratio — ABNORMAL HIGH (ref 0.0–4.4)
Cholesterol, Total: 313 mg/dL — ABNORMAL HIGH (ref 100–199)
HDL: 46 mg/dL (ref >39)
LDL Chol Calc (NIH): 224 mg/dL — ABNORMAL HIGH (ref 0–99)
Triglycerides: 216 mg/dL — ABNORMAL HIGH (ref 0–149)
VLDL Cholesterol Cal: 43 mg/dL — ABNORMAL HIGH (ref 5–40)

## 2020-03-27 LAB — CBC
Hematocrit: 38.5 % (ref 34.0–46.6)
Hemoglobin: 12.8 g/dL (ref 11.1–15.9)
MCH: 26.8 pg (ref 26.6–33.0)
MCHC: 33.2 g/dL (ref 31.5–35.7)
MCV: 81 fL (ref 79–97)
Platelets: 366 10*3/uL (ref 150–450)
RBC: 4.78 x10E6/uL (ref 3.77–5.28)
RDW: 13.1 % (ref 11.7–15.4)
WBC: 6.2 10*3/uL (ref 3.4–10.8)

## 2020-03-27 LAB — CMP14+EGFR
ALT: 10 IU/L (ref 0–32)
AST: 11 IU/L (ref 0–40)
Albumin/Globulin Ratio: 1.1 — ABNORMAL LOW (ref 1.2–2.2)
Albumin: 4.1 g/dL (ref 3.8–4.8)
Alkaline Phosphatase: 69 IU/L (ref 44–121)
BUN/Creatinine Ratio: 10 (ref 9–23)
BUN: 8 mg/dL (ref 6–24)
Bilirubin Total: 0.2 mg/dL (ref 0.0–1.2)
CO2: 23 mmol/L (ref 20–29)
Calcium: 10 mg/dL (ref 8.7–10.2)
Chloride: 99 mmol/L (ref 96–106)
Creatinine, Ser: 0.8 mg/dL (ref 0.57–1.00)
GFR calc Af Amer: 106 mL/min/{1.73_m2} (ref >59)
GFR calc non Af Amer: 92 mL/min/{1.73_m2} (ref >59)
Globulin, Total: 3.7 g/dL (ref 1.5–4.5)
Glucose: 189 mg/dL — ABNORMAL HIGH (ref 65–99)
Potassium: 4.4 mmol/L (ref 3.5–5.2)
Sodium: 139 mmol/L (ref 134–144)
Total Protein: 7.8 g/dL (ref 6.0–8.5)

## 2020-03-27 LAB — HSV-2 IGG SUPPLEMENTAL TEST: HSV-2 IgG Supplemental Test: NEGATIVE

## 2020-03-27 LAB — HIV ANTIBODY (ROUTINE TESTING W REFLEX): HIV Screen 4th Generation wRfx: NONREACTIVE

## 2020-03-27 LAB — HEMOGLOBIN A1C
Est. average glucose Bld gHb Est-mCnc: 269 mg/dL
Hgb A1c MFr Bld: 11 % — ABNORMAL HIGH (ref 4.8–5.6)

## 2020-03-27 LAB — TSH: TSH: 1.7 u[IU]/mL (ref 0.450–4.500)

## 2020-03-27 LAB — HSV 2 ANTIBODY, IGG: HSV 2 IgG, Type Spec: 3.6 index — ABNORMAL HIGH (ref 0.00–0.90)

## 2020-03-27 LAB — VITAMIN D 25 HYDROXY (VIT D DEFICIENCY, FRACTURES): Vit D, 25-Hydroxy: 15.4 ng/mL — ABNORMAL LOW (ref 30.0–100.0)

## 2020-03-27 MED ORDER — FLUCONAZOLE 150 MG PO TABS: ORAL_TABLET | ORAL | 0 refills | Status: DC

## 2020-03-27 NOTE — Telephone Encounter (Signed)
Pls see result note.

## 2020-03-27 NOTE — Telephone Encounter (Signed)
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 She doesn't understand the results

## 2020-03-28 LAB — MICROALBUMIN, URINE: Microalbumin, Urine: 208.8 ug/mL

## 2020-04-02 ENCOUNTER — Telehealth (INDEPENDENT_AMBULATORY_CARE_PROVIDER_SITE_OTHER): Payer: BC Managed Care – PPO | Admitting: Family Medicine

## 2020-04-02 ENCOUNTER — Other Ambulatory Visit: Payer: Self-pay

## 2020-04-02 VITALS — Ht 60.0 in | Wt 118.0 lb

## 2020-04-02 DIAGNOSIS — E1165 Type 2 diabetes mellitus with hyperglycemia: Secondary | ICD-10-CM | POA: Diagnosis not present

## 2020-04-02 DIAGNOSIS — I10 Essential (primary) hypertension: Secondary | ICD-10-CM

## 2020-04-02 DIAGNOSIS — E785 Hyperlipidemia, unspecified: Secondary | ICD-10-CM | POA: Diagnosis not present

## 2020-04-02 DIAGNOSIS — E559 Vitamin D deficiency, unspecified: Secondary | ICD-10-CM | POA: Diagnosis not present

## 2020-04-02 MED ORDER — GLIPIZIDE ER 10 MG PO TB24: 10.0000 mg | ORAL_TABLET | Freq: Every day | ORAL | 3 refills | Status: DC

## 2020-04-02 MED ORDER — LISINOPRIL-HYDROCHLOROTHIAZIDE 10-12.5 MG PO TABS: 1.0000 | ORAL_TABLET | Freq: Every day | ORAL | 1 refills | Status: DC

## 2020-04-02 MED ORDER — INSULIN GLARGINE 100 UNIT/ML SOLOSTAR PEN: 10.0000 [IU] | PEN_INJECTOR | Freq: Every day | SUBCUTANEOUS | 3 refills | Status: DC

## 2020-04-02 MED ORDER — VITAMIN D (ERGOCALCIFEROL) 1.25 MG (50000 UNIT) PO CAPS: 50000.0000 [IU] | ORAL_CAPSULE | ORAL | 1 refills | Status: DC

## 2020-04-02 MED ORDER — ROSUVASTATIN CALCIUM 40 MG PO TABS: 40.0000 mg | ORAL_TABLET | Freq: Every day | ORAL | 2 refills | Status: DC

## 2020-04-02 NOTE — Assessment & Plan Note (Signed)
Heart healthy diet encouraged restarting Crestor we will follow-up at next appointment.

## 2020-04-02 NOTE — Patient Instructions (Addendum)
  HAPPY FALL!  I appreciate the opportunity to provide you with care for your health and wellness. Today we discussed: recent labs and medication adjustments  Follow up: 3 months for A1c check in office   No labs or referrals today  HAPPY BIRTHDAY AND THANKSGIVING!  Please continue to practice social distancing to keep you, your family, and our community safe.  If you must go out, please wear a mask and practice good handwashing.  It was a pleasure to see you and I look forward to continuing to work together on your health and well-being. Please do not hesitate to call the office if you need care or have questions about your care.  Have a wonderful day and week. With Gratitude, Tereasa Coop, DNP, AGNP-BC

## 2020-04-02 NOTE — Assessment & Plan Note (Signed)
Uncontrolled A1c recently 11%.  Will restart glipizide and Lantus 10 units.  Most likely will need to be referred back to Endo for this.

## 2020-04-02 NOTE — Assessment & Plan Note (Signed)
Restarting of weekly administration of vitamin D.  We will follow-up at next appointment.  Encouraged that after she finishes her supplementation that she will have to be on a supplement most likely daily secondary to the low level in her not having enough vitamin D in her diet.

## 2020-04-02 NOTE — Assessment & Plan Note (Signed)
Will start lisinopril and hydrochlorothiazide again. Encouraged DASH diet and exercise.

## 2020-04-02 NOTE — Progress Notes (Signed)
Virtual Visit via Telephone Note   This visit type was conducted due to national recommendations for restrictions regarding the COVID-19 Pandemic (e.g. social distancing) in an effort to limit this patient's exposure and mitigate transmission in our community.  Due to her co-morbid illnesses, this patient is at least at moderate risk for complications without adequate follow up.  This format is felt to be most appropriate for this patient at this time.  The patient did not have access to video technology/had technical difficulties with video requiring transitioning to audio format only (telephone).  All issues noted in this document were discussed and addressed.  No physical exam could be performed with this format.    Evaluation Performed:  Follow-up visit  Date:  04/02/2020   ID:  [name] Fernandez, DOB 1975/07/02, MRN [id number]  Participants include: Nurse, provider, patient  Patient Location: Home Provider Location: Home Office  Location of Patient: Home Location of Provider: Telehealth Consent was obtain for visit to be over via telehealth. I verified that I am speaking with the correct person using two identifiers.  PCP:  Kerri Perches, MD   Chief Complaint: Follow-up on recent labs  History of Present Illness:    [name] Fernandez is a 44 y.o. female with history as stated below.  Reports sent for a phone visit secondary to having recent labs that demonstrated abnormal levels and the need to discuss and restart medications. Recent labs demonstrated good kidney function and liver function.  However she had a significant increase in her cholesterol levels.  Her A1c was 11%.  Her vitamin D was 15.4%.  She has been recently pregnant and therefore had stopped some of her medications secondary to this.  And reports that she is now needing to get back on them.  She is willing to have all her medications restarted and to go back on Lantus to make sure that she can get her blood  sugar under control.  She mentions having increased problems with vaginal flora.  Discussion of boric acid in the near future and possible need for her to come in and leave a sample to make sure that we are treating the appropriate symptoms.  She reports that she will call if needed.  She denies having any other issues or concerns to discuss today.  The patient does not have symptoms concerning for COVID-19 infection (fever, chills, cough, or new shortness of breath).   Past Medical, Surgical, Social History, Allergies, and Medications have been Reviewed.  Past Medical History:  Diagnosis Date  . Abnormal bleeding in menstrual cycle 06/05/2016  . Anxiety   . Arthritis   . Chlamydia infection 03/15/2016  . Chronic hypertension affecting pregnancy 10/11/2018  . Depression   . Depression    Phreesia 04/02/2020  . Diabetes mellitus   . Diabetes mellitus without complication (HCC)    Phreesia 04/02/2020  . GERD (gastroesophageal reflux disease)   . Headache(784.0)   . Hyperlipidemia   . Hypertension   . Multigravida of advanced maternal age in first trimester 10/11/2018   Past Surgical History:  Procedure Laterality Date  . BREAST SURGERY     right lumpectomy, benign  . CHOLECYSTECTOMY N/A 12/27/2013   Procedure: LAPAROSCOPIC CHOLECYSTECTOMY ;  Surgeon: Marlane Hatcher, MD;  Location: [facility];  Service: General;  Laterality: N/A;     Current Meds  Medication Sig  . FLUoxetine (PROZAC) 20 MG capsule 60 mg daily, take along with 40 mg cap  . FLUoxetine (  PROZAC) 40 MG capsule 60 mg daily, take along with 20 mg cap  . glipiZIDE (GLUCOTROL XL) 10 MG 24 hr tablet Take 1 tablet (10 mg total) by mouth daily with breakfast.  . insulin glargine (LANTUS) 100 UNIT/ML Solostar Pen Inject 10 Units into the skin at bedtime.  Marland Kitchen lisinopril-hydrochlorothiazide (ZESTORETIC) 10-12.5 MG tablet Take 1 tablet by mouth daily.  . Multiple Vitamin (MULTIVITAMIN) capsule Take 1 capsule by mouth daily.  .  rosuvastatin (CRESTOR) 40 MG tablet Take 1 tablet (40 mg total) by mouth daily.  . [DISCONTINUED] fluconazole (DIFLUCAN) 150 MG tablet Take one tablet by mouth one time only  . [DISCONTINUED] FLUoxetine (PROZAC) 40 MG capsule Take 1 capsule (40 mg total) by mouth daily.  . [DISCONTINUED] folic acid (FOLVITE) 0.5 MG tablet Take 0.5 mg by mouth daily.  . [DISCONTINUED] glipiZIDE (GLUCOTROL XL) 5 MG 24 hr tablet Take 1 tablet (5 mg total) by mouth daily with breakfast.  . [DISCONTINUED] ibuprofen (ADVIL) 800 MG tablet Take 1 tablet (800 mg total) by mouth every 8 (eight) hours as needed.  . [DISCONTINUED] Insulin Glargine (LANTUS) 100 UNIT/ML Solostar Pen Inject 30 Units into the skin at bedtime.  . [DISCONTINUED] lisinopril-hydrochlorothiazide (ZESTORETIC) 10-12.5 MG tablet Take 1 tablet by mouth daily.  . [DISCONTINUED] methyldopa (ALDOMET) 250 MG tablet Take one tablet by mouth two times daily for blood pressure, 12 hours apart  . [DISCONTINUED] metroNIDAZOLE (FLAGYL) 500 MG tablet Take 1 tablet (500 mg total) by mouth 2 (two) times daily.  . [DISCONTINUED] ondansetron (ZOFRAN ODT) 8 MG disintegrating tablet Take 1 tablet (8 mg total) by mouth every 8 (eight) hours as needed for nausea or vomiting.  . [DISCONTINUED] oxyCODONE-acetaminophen (PERCOCET/ROXICET) 5-325 MG tablet Take 1-2 tablets by mouth every 6 (six) hours as needed for severe pain.  . [DISCONTINUED] rosuvastatin (CRESTOR) 40 MG tablet Take 1 tablet (40 mg total) by mouth daily.   Current Facility-Administered Medications for the 04/02/20 encounter (Video Visit) with Freddy Finner, NP  Medication  . pramoxine-hydrocortisone (PROCTOCREAM-HC) rectal cream     Allergies:   Janumet [sitagliptin-metformin hcl]   ROS:   Please see the history of present illness.    All other systems reviewed and are negative.   Labs/Other Tests and Data Reviewed:    Recent Labs: 03/26/2020: ALT 10; BUN 8; Creatinine, Ser 0.80; Hemoglobin 12.8;  Platelets 366; Potassium 4.4; Sodium 139; TSH 1.700   Recent Lipid Panel Lab Results  Component Value Date/Time   CHOL 313 (H) 03/26/2020 08:23 AM   TRIG 216 (H) 03/26/2020 08:23 AM   HDL 46 03/26/2020 08:23 AM   CHOLHDL 6.8 (H) 03/26/2020 08:23 AM   CHOLHDL 6.9 (H) 10/30/2018 11:50 AM   LDLCALC 224 (H) 03/26/2020 08:23 AM   LDLCALC 185 (H) 10/30/2018 11:50 AM    Wt Readings from Last 3 Encounters:  04/02/20 118 lb (53.5 kg)  01/05/20 116 lb (52.6 kg)  11/04/18 125 lb 14.4 oz (57.1 kg)     Objective:    Vital Signs:  Ht 5' (1.524 m)   Wt 118 lb (53.5 kg)   LMP 03/10/2020   BMI 23.05 kg/m    VITAL SIGNS:  reviewed GEN:  Alert and oriented RESPIRATORY:  No shortness of breath noted in conversation PSYCH:  Normal affect and mood  ASSESSMENT & PLAN:    1. Uncontrolled type 2 diabetes mellitus with hyperglycemia (HCC)  - lisinopril-hydrochlorothiazide (ZESTORETIC) 10-12.5 MG tablet; Take 1 tablet by mouth daily.  Dispense: 90 tablet; Refill:  1 - insulin glargine (LANTUS) 100 UNIT/ML Solostar Pen; Inject 10 Units into the skin at bedtime.  Dispense: 15 mL; Refill: 3 - glipiZIDE (GLUCOTROL XL) 10 MG 24 hr tablet; Take 1 tablet (10 mg total) by mouth daily with breakfast.  Dispense: 30 tablet; Refill: 3 - rosuvastatin (CRESTOR) 40 MG tablet; Take 1 tablet (40 mg total) by mouth daily.  Dispense: 30 tablet; Refill: 2  2. Essential hypertension  - lisinopril-hydrochlorothiazide (ZESTORETIC) 10-12.5 MG tablet; Take 1 tablet by mouth daily.  Dispense: 90 tablet; Refill: 1  3. Hyperlipidemia LDL goal <100 - rosuvastatin (CRESTOR) 40 MG tablet; Take 1 tablet (40 mg total) by mouth daily.  Dispense: 30 tablet; Refill: 2  4. Vitamin D deficiency  - Vitamin D, Ergocalciferol, (DRISDOL) 1.25 MG (50000 UNIT) CAPS capsule; Take 1 capsule (50,000 Units total) by mouth every 7 (seven) days.  Dispense: 12 capsule; Refill: 1   Time:   Today, I have spent 10 minutes with the patient  with telehealth technology discussing the above problems.     Medication Adjustments/Labs and Tests Ordered: Current medicines are reviewed at length with the patient today.  Concerns regarding medicines are outlined above.   Tests Ordered: No orders of the defined types were placed in this encounter.   Medication Changes: Meds ordered this encounter  Medications  . lisinopril-hydrochlorothiazide (ZESTORETIC) 10-12.5 MG tablet    Sig: Take 1 tablet by mouth daily.    Dispense:  90 tablet    Refill:  1    Order Specific Question:   Supervising Provider    Answer:   SIMPSON, MARGARET E [[id number]]  . insulin glargine (LANTUS) 100 UNIT/ML Solostar Pen    Sig: Inject 10 Units into the skin at bedtime.    Dispense:  15 mL    Refill:  3    Order Specific Question:   Supervising Provider    Answer:   SIMPSON, MARGARET E [[id number]]  . glipiZIDE (GLUCOTROL XL) 10 MG 24 hr tablet    Sig: Take 1 tablet (10 mg total) by mouth daily with breakfast.    Dispense:  30 tablet    Refill:  3    Order Specific Question:   Supervising Provider    Answer:   SIMPSON, MARGARET E [[id number]]  . rosuvastatin (CRESTOR) 40 MG tablet    Sig: Take 1 tablet (40 mg total) by mouth daily.    Dispense:  30 tablet    Refill:  2    Order Specific Question:   Supervising Provider    Answer:   SIMPSON, MARGARET E [[id number]]  . Vitamin D, Ergocalciferol, (DRISDOL) 1.25 MG (50000 UNIT) CAPS capsule    Sig: Take 1 capsule (50,000 Units total) by mouth every 7 (seven) days.    Dispense:  12 capsule    Refill:  1    Order Specific Question:   Supervising Provider    Answer:   Kerri Perches [[id number]]     Note: This dictation was prepared with Dragon dictation along with smaller phrase technology. Similar sounding words can be transcribed inadequately or may not be corrected upon review. Any transcriptional errors that result from this process are unintentional.      Disposition:  Follow up 3 months Signed, Freddy Finner,  NP  04/02/2020 4:18 PM     [facility]

## 2020-04-07 ENCOUNTER — Ambulatory Visit: Payer: BC Managed Care – PPO | Admitting: Family Medicine

## 2020-04-17 ENCOUNTER — Other Ambulatory Visit (HOSPITAL_COMMUNITY)
Admission: RE | Admit: 2020-04-17 | Discharge: 2020-04-17 | Disposition: A | Discharge status: Home / Self Care | Payer: BC Managed Care – PPO | Source: Ambulatory Visit | Attending: Family Medicine | Admitting: Family Medicine

## 2020-04-17 ENCOUNTER — Ambulatory Visit (INDEPENDENT_AMBULATORY_CARE_PROVIDER_SITE_OTHER): Payer: BC Managed Care – PPO | Admitting: Family Medicine

## 2020-04-17 ENCOUNTER — Other Ambulatory Visit: Payer: Self-pay

## 2020-04-17 VITALS — BP 159/91 | HR 77 | Resp 16 | Ht 61.0 in | Wt 121.0 lb

## 2020-04-17 DIAGNOSIS — N760 Acute vaginitis: Secondary | ICD-10-CM | POA: Insufficient documentation

## 2020-04-17 DIAGNOSIS — A5402 Gonococcal vulvovaginitis, unspecified: Secondary | ICD-10-CM | POA: Insufficient documentation

## 2020-04-17 DIAGNOSIS — Z23 Encounter for immunization: Secondary | ICD-10-CM | POA: Diagnosis not present

## 2020-04-17 DIAGNOSIS — E1165 Type 2 diabetes mellitus with hyperglycemia: Secondary | ICD-10-CM

## 2020-04-17 DIAGNOSIS — I10 Essential (primary) hypertension: Secondary | ICD-10-CM

## 2020-04-17 DIAGNOSIS — F324 Major depressive disorder, single episode, in partial remission: Secondary | ICD-10-CM

## 2020-04-17 DIAGNOSIS — R0683 Snoring: Secondary | ICD-10-CM

## 2020-04-17 DIAGNOSIS — F322 Major depressive disorder, single episode, severe without psychotic features: Secondary | ICD-10-CM

## 2020-04-17 DIAGNOSIS — R5382 Chronic fatigue, unspecified: Secondary | ICD-10-CM

## 2020-04-17 DIAGNOSIS — B9689 Other specified bacterial agents as the cause of diseases classified elsewhere: Secondary | ICD-10-CM | POA: Insufficient documentation

## 2020-04-17 MED ORDER — AMLODIPINE BESYLATE 5 MG PO TABS: 5.0000 mg | ORAL_TABLET | Freq: Every day | ORAL | 3 refills | Status: DC

## 2020-04-17 NOTE — Patient Instructions (Signed)
Physical and Pap with MD 3rd week in  January call if you need me sooner.  Flu vaccine in office today.  You are referred for evaluation by lung specialist in [place] for possible sleep apnea.  You are referred to [facility] for therapy to treat your depression and anxiety.  You are referred to Dr. Fransico Him for management of uncontrolled diabetes.  Continue current medication you are taking at the same dose for your blood pressure.  New additional medicine is amlodipine 5 mg 1 daily since your blood pressure is high.  You will be contacted with results of specimen sent.   It is important that you exercise regularly at least 30 minutes 5 times a week. If you develop chest pain, have severe difficulty breathing, or feel very tired, stop exercising immediately and seek medical attention  Think about what you will eat, plan ahead. Choose " clean, green, fresh or frozen" over canned, processed or packaged foods which are more sugary, salty and fatty. 70 to 75% of food eaten should be vegetables and fruit. Three meals at set times with snacks allowed between meals, but they must be fruit or vegetables. Aim to eat over a 12 hour period , example 7 am to 7 pm, and STOP after  your last meal of the day. Drink water,generally about 64 ounces per day, no other drink is as healthy. Fruit juice is best enjoyed in a healthy way, by EATING the fruit. Thanks for choosing [facility], we consider it a privelige to serve you.

## 2020-04-18 ENCOUNTER — Encounter: Payer: Self-pay | Admitting: Emergency Medicine

## 2020-04-18 ENCOUNTER — Other Ambulatory Visit: Payer: Self-pay

## 2020-04-18 ENCOUNTER — Ambulatory Visit
Admission: EM | Admit: 2020-04-18 | Discharge: 2020-04-18 | Disposition: A | Discharge status: Home / Self Care | Payer: BC Managed Care – PPO | Attending: Family Medicine | Admitting: Family Medicine

## 2020-04-18 DIAGNOSIS — A549 Gonococcal infection, unspecified: Secondary | ICD-10-CM | POA: Diagnosis not present

## 2020-04-18 DIAGNOSIS — B9689 Other specified bacterial agents as the cause of diseases classified elsewhere: Secondary | ICD-10-CM

## 2020-04-18 DIAGNOSIS — N760 Acute vaginitis: Secondary | ICD-10-CM

## 2020-04-18 LAB — CERVICOVAGINAL ANCILLARY ONLY
Bacterial Vaginitis (gardnerella): POSITIVE — AB
Chlamydia: NEGATIVE
Comment: NEGATIVE
Comment: NEGATIVE
Comment: NEGATIVE
Comment: NORMAL
Neisseria Gonorrhea: POSITIVE — AB
Trichomonas: NEGATIVE

## 2020-04-18 MED ORDER — FLUCONAZOLE 150 MG PO TABS: 150.0000 mg | ORAL_TABLET | Freq: Once | ORAL | 0 refills | Status: AC

## 2020-04-18 MED ORDER — CEFTRIAXONE SODIUM 500 MG IJ SOLR
500.0000 mg | Freq: Once | INTRAMUSCULAR | Status: AC
  Administered 2020-04-18: 500 mg via INTRAMUSCULAR

## 2020-04-18 MED ORDER — METRONIDAZOLE 500 MG PO TABS: 500.0000 mg | ORAL_TABLET | Freq: Two times a day (BID) | ORAL | 0 refills | Status: DC

## 2020-04-18 NOTE — ED Provider Notes (Signed)
[facility]    CSN: [id number] Arrival date & time: 04/18/20  1743      History   Chief Complaint Chief Complaint  Patient presents with  . SEXUALLY TRANSMITTED DISEASE    HPI [name] Fernandez is a 44 y.o. female.   HPI  Patient presents today for treatment of gonorrhea and bacterial vaginosis.  Patient was seen by her PCP yesterday and had a vaginal cytology test performed to rule out vaginitis and screening was positive for gonorrhea and BV.  Patient only here for treatment today.  Past Medical History:  Diagnosis Date  . Abnormal bleeding in menstrual cycle 06/05/2016  . Anxiety   . Arthritis   . Chlamydia infection 03/15/2016  . Chronic hypertension affecting pregnancy 10/11/2018  . Depression   . Depression    Phreesia 04/02/2020  . Diabetes mellitus   . Diabetes mellitus without complication (HCC)    Phreesia 04/02/2020  . GERD (gastroesophageal reflux disease)   . Headache(784.0)   . Hyperlipidemia   . Hypertension   . Multigravida of advanced maternal age in first trimester 10/11/2018    Patient Active Problem List   Diagnosis Date Noted  . Depression, major, single episode, severe (HCC) 11/01/2018  . Allergic urticaria 06/05/2016  . Vitamin D deficiency 06/18/2011  . Hyperlipidemia LDL goal <100 05/19/2008  . Essential hypertension 05/19/2008  . DM (diabetes mellitus), type 2, uncontrolled (HCC) 05/14/2008    Past Surgical History:  Procedure Laterality Date  . BREAST SURGERY     right lumpectomy, benign  . CHOLECYSTECTOMY N/A 12/27/2013   Procedure: LAPAROSCOPIC CHOLECYSTECTOMY ;  Surgeon: Marlane Hatcher, MD;  Location: [facility];  Service: General;  Laterality: N/A;    OB History    Gravida  5   Para  3   Term      Preterm      AB  1   Living  3     SAB  1   IAB      Ectopic      Multiple      Live Births               Home Medications    Prior to Admission medications   Medication Sig Start Date End Date  Taking? Authorizing Provider  amLODipine (NORVASC) 5 MG tablet Take 1 tablet (5 mg total) by mouth daily. 04/17/20   Kerri Perches, MD  FLUoxetine (PROZAC) 20 MG capsule 60 mg daily, take along with 40 mg cap 03/25/20   Hisada, Barbee Cough, MD  FLUoxetine (PROZAC) 40 MG capsule 60 mg daily, take along with 20 mg cap 03/25/20   Hisada, Barbee Cough, MD  glipiZIDE (GLUCOTROL XL) 10 MG 24 hr tablet Take 1 tablet (10 mg total) by mouth daily with breakfast. 04/02/20   Freddy Finner, NP  insulin glargine (LANTUS) 100 UNIT/ML Solostar Pen Inject 10 Units into the skin at bedtime. 04/02/20   Freddy Finner, NP  lisinopril-hydrochlorothiazide (ZESTORETIC) 10-12.5 MG tablet Take 1 tablet by mouth daily. 04/02/20   Freddy Finner, NP  Multiple Vitamin (MULTIVITAMIN) capsule Take 1 capsule by mouth daily.    [provider]  rosuvastatin (CRESTOR) 40 MG tablet Take 1 tablet (40 mg total) by mouth daily. 04/02/20   Freddy Finner, NP  Vitamin D, Ergocalciferol, (DRISDOL) 1.25 MG (50000 UNIT) CAPS capsule Take 1 capsule (50,000 Units total) by mouth every 7 (seven) days. 04/02/20   Freddy Finner, NP    Family  History Family History  Problem Relation Age of Onset  . Depression Mother   . Alcohol abuse Mother   . Depression Brother   . Sickle cell anemia Brother   . Hypertension Brother   . Diabetes Father   . COPD Father   . Anxiety disorder Maternal Aunt     Social History Social History   Tobacco Use  . Smoking status: Never Smoker  . Smokeless tobacco: Never Used  Substance Use Topics  . Alcohol use: Not Currently    Alcohol/week: 0.0 standard drinks    Comment: occ. wine a glass  . Drug use: No     Allergies   Janumet [sitagliptin-metformin hcl]   Review of Systems Review of Systems Pertinent negatives listed in HPI   Physical Exam Triage Vital Signs ED Triage Vitals  Enc Vitals Group     BP 04/18/20 1810 (!) 181/100     Pulse Rate 04/18/20 1810 83     Resp  04/18/20 1810 18     Temp 04/18/20 1810 99.3 F (37.4 C)     Temp Source 04/18/20 1810 Oral     SpO2 04/18/20 1810 99 %     Weight 04/18/20 1809 121 lb (54.9 kg)     Height 04/18/20 1809 5\' 1"  (1.549 m)     Head Circumference --      Peak Flow --      Pain Score 04/18/20 1809 0     Pain Loc --      Pain Edu? --      Excl. in GC? --    No data found.  Updated Vital Signs BP (!) 181/100 (BP Location: Right Arm) Comment: pcp placed pt on additional bp medication but pt has not picked it up yet. took hctz this morning  Pulse 83   Temp 99.3 F (37.4 C) (Oral)   Resp 18   Ht 5\' 1"  (1.549 m)   Wt 121 lb (54.9 kg)   LMP 03/18/2020   SpO2 99%   BMI 22.86 kg/m   Visual Acuity Right Eye Distance:   Left Eye Distance:   Bilateral Distance:    Right Eye Near:   Left Eye Near:    Bilateral Near:     Physical Exam General appearance: alert, well developed, well nourished, cooperative  Head: Normocephalic, without obvious abnormality, atraumatic Respiratory: Respirations even and unlabored, normal respiratory rate Heart: rate and rhythm normal. No gallop or murmurs noted on exam  Extremities: No gross deformities Skin: Skin color, texture, turgor normal. No rashes seen  Psych: Appropriate mood and affect. UC Treatments / Results  Labs (all labs ordered are listed, but only abnormal results are displayed) Labs Reviewed - No data to display  EKG   Radiology No results found.  Procedures Procedures (including critical care time)  Medications Ordered in UC Medications  cefTRIAXone (ROCEPHIN) injection 500 mg (has no administration in time range)    Initial Impression / Assessment and Plan / UC Course  I have reviewed the triage vital signs and the nursing notes.  Pertinent labs & imaging results that were available during my care of the patient were reviewed by me and considered in my medical decision making (see chart for details).     Rocephin 500 mg IM  administered here in clinic today.  Metronidazole 500 mg twice daily x7 days.  Diflucan as needed for prophylactic against these due to metronidazole treatment.  Follow-up with PCP as needed Final Clinical Impressions(s) / UC Diagnoses  Final diagnoses:  Gonorrhea  Bacterial vaginosis   Discharge Instructions   None    ED Prescriptions    Medication Sig Dispense Auth. Provider   metroNIDAZOLE (FLAGYL) 500 MG tablet Take 1 tablet (500 mg total) by mouth 2 (two) times daily. 14 tablet Bing Neighbors, FNP   fluconazole (DIFLUCAN) 150 MG tablet Take 1 tablet (150 mg total) by mouth once for 1 dose. Repeat if needed 2 tablet Bing Neighbors, FNP     PDMP not reviewed this encounter.   Bing Neighbors, FNP 04/18/20 1859

## 2020-04-18 NOTE — ED Triage Notes (Signed)
Pt seen at pcp office on Thursday, had swab for poss BV.  Pt's swab came back + for gonorrhea and BV. Pt would like tx today.

## 2020-04-20 ENCOUNTER — Encounter: Payer: Self-pay | Admitting: Family Medicine

## 2020-04-20 DIAGNOSIS — R0683 Snoring: Secondary | ICD-10-CM | POA: Insufficient documentation

## 2020-04-20 NOTE — Assessment & Plan Note (Signed)
Snoring and chronic fatigue referred to pulmonary for evaluation for sleep apnea

## 2020-04-20 NOTE — Assessment & Plan Note (Signed)
[name] is reminded of the importance of commitment to daily physical activity for 30 minutes or more, as able and the need to limit carbohydrate intake to 30 to 60 grams per meal to help with blood sugar control.   The need to take medication as prescribed, test blood sugar as directed, and to call between visits if there is a concern that blood sugar is uncontrolled is also discussed.  Refer to Endo  [name] is reminded of the importance of daily foot exam, annual eye examination, and good blood sugar, blood pressure and cholesterol control.  Diabetic Labs Latest Ref Rng & Units 03/26/2020 10/30/2018 01/16/2018 12/22/2017 06/04/2016  HbA1c 4.8 - 5.6 % 11.0(H) 10.5(H) - - 12.1(H)  Microalbumin mg/dL - 2.2 426.4(H) - -  Micro/Creat Ratio 0.0 - 30.0 mg/g creat - - 178.3(H) - -  Chol 100 - 199 mg/dL 834(H) 962(I) - - -  HDL >39 mg/dL 46 29(N) - - -  Calc LDL 0 - 99 mg/dL 989(Q) 119(E) - - -  Triglycerides 0 - 149 mg/dL 174(Y) 814(G) - - -  Creatinine 0.57 - 1.00 mg/dL 8.18 5.63 - 1.49 7.02   BP/Weight 04/18/2020 04/17/2020 04/02/2020 01/05/2020 11/04/2018 11/02/2018 11/01/2018  Systolic BP 181 159 - 186 176 637 118  Diastolic BP 100 91 - 116 81 70 64  Wt. (Lbs) 121 121.04 118 116 125.9 126 124.12  BMI 22.86 22.87 23.05 22.65 27.24 27.27 26.86  Some encounter information is confidential and restricted. Go to Review Flowsheets activity to see all data.   Foot/eye exam completion dates Latest Ref Rng & Units 03/03/2015 01/24/2013  Eye Exam No Retinopathy No Retinopathy -  Foot Form Completion - - Done  Some encounter information is confidential and restricted. Go to Review Flowsheets activity to see all data.

## 2020-04-20 NOTE — Assessment & Plan Note (Signed)
Self colllecte specimens sent for testing, will treat based on result

## 2020-04-20 NOTE — Assessment & Plan Note (Addendum)
Elevated at visit, meds revirewed amlodipine added DASH diet and commitment to daily physical activity for a minimum of 30 minutes discussed and encouraged, as a part of hypertension management. The importance of attaining a healthy weight is also discussed.  BP/Weight 04/18/2020 04/17/2020 04/02/2020 01/05/2020 11/04/2018 11/02/2018 11/01/2018  Systolic BP 181 159 - 186 176 767 118  Diastolic BP 100 91 - 116 81 70 64  Wt. (Lbs) 121 121.04 118 116 125.9 126 124.12  BMI 22.86 22.87 23.05 22.65 27.24 27.27 26.86  Some encounter information is confidential and restricted. Go to Review Flowsheets activity to see all data.

## 2020-04-20 NOTE — Progress Notes (Signed)
[name] Fernandez     MRN: [id number]      DOB: Jul 01, 1975   HPI [name] Fernandez is here with c/o recurrent foul smelling vaginal d/c recently treated for BV but recurred Denies urinary symptoms , fever or chill Blood sugar uncontrolled ,not testing, states has no supplies, plans to keep Endo appointment C/o snoring and excessive fatigue ROS Denies recent fever or chills. Denies sinus pressure, nasal congestion, ear pain or sore throat. Denies chest congestion, productive cough or wheezing. Denies chest pains, palpitations and leg swelling Denies abdominal pain, nausea, vomiting,diarrhea or constipation.    Denies joint pain, swelling and limitation in mobility. Denies headaches, seizures, numbness, or tingling. Denies uncontrolled  depression, anxiety or insomnia. Denies skin break down or rash.   PE  BP (!) 159/91   Pulse 77   Resp 16   Ht 5\' 1"  (1.549 m)   Wt 121 lb 0.6 oz (54.9 kg)   LMP 03/18/2020   SpO2 99%   BMI 22.87 kg/m   Patient alert and oriented and in no cardiopulmonary distress.  HEENT: No facial asymmetry, EOMI,     Neck supple .  Chest: Clear to auscultation bilaterally.  CVS: S1, S2 no murmurs, no S3.Regular rate.  ABD: Soft non tender.   Ext: No edema  MS: Adequate ROM spine, shoulders, hips and knees.  Skin: Intact, no ulcerations or rash noted.  Psych: Good eye contact, normal affect. Memory intact not anxious or depressed appearing.  CNS: CN 2-12 intact, power,  normal throughout.no focal deficits noted.   Assessment & Plan  Vulvovaginitis Self colllecte specimens sent for testing, will treat based on result  Snoring Snoring and chronic fatigue referred to pulmonary for evaluation for sleep apnea  Essential hypertension Elevated at visit, meds revirewed amlodipine added DASH diet and commitment to daily physical activity for a minimum of 30 minutes discussed and encouraged, as a part of hypertension management. The importance of  attaining a healthy weight is also discussed.  BP/Weight 04/18/2020 04/17/2020 04/02/2020 01/05/2020 11/04/2018 11/02/2018 11/01/2018  Systolic BP 181 159 - 186 176 11/03/2018 118  Diastolic BP 100 91 - 116 81 70 64  Wt. (Lbs) 121 121.04 118 116 125.9 126 124.12  BMI 22.86 22.87 23.05 22.65 27.24 27.27 26.86  Some encounter information is confidential and restricted. Go to Review Flowsheets activity to see all data.       DM (diabetes mellitus), type 2, uncontrolled [name] Fernandez is reminded of the importance of commitment to daily physical activity for 30 minutes or more, as able and the need to limit carbohydrate intake to 30 to 60 grams per meal to help with blood sugar control.   The need to take medication as prescribed, test blood sugar as directed, and to call between visits if there is a concern that blood sugar is uncontrolled is also discussed.  Refer to Endo  [name] Fernandez is reminded of the importance of daily foot exam, annual eye examination, and good blood sugar, blood pressure and cholesterol control.  Diabetic Labs Latest Ref Rng & Units 03/26/2020 10/30/2018 01/16/2018 12/22/2017 06/04/2016  HbA1c 4.8 - 5.6 % 11.0(H) 10.5(H) - - 12.1(H)  Microalbumin mg/dL - 2.2 06/06/2016.4(H) - -  Micro/Creat Ratio 0.0 - 30.0 mg/g creat - - 178.3(H) - -  Chol 100 - 199 mg/dL 347) 425(Z) - - -  HDL >39 mg/dL 46 563(O) - - -  Calc LDL 0 - 99 mg/dL 75(I) 433(I) - - -  Triglycerides 0 -  149 mg/dL 831(D) 176(H) - - -  Creatinine 0.57 - 1.00 mg/dL 6.07 3.71 - 0.62 6.94   BP/Weight 04/18/2020 04/17/2020 04/02/2020 01/05/2020 11/04/2018 11/02/2018 11/01/2018  Systolic BP 181 159 - 186 176 854 118  Diastolic BP 100 91 - 116 81 70 64  Wt. (Lbs) 121 121.04 118 116 125.9 126 124.12  BMI 22.86 22.87 23.05 22.65 27.24 27.27 26.86  Some encounter information is confidential and restricted. Go to Review Flowsheets activity to see all data.   Foot/eye exam completion dates Latest Ref Rng & Units 03/03/2015 01/24/2013  Eye  Exam No Retinopathy No Retinopathy -  Foot Form Completion - - Done  Some encounter information is confidential and restricted. Go to Review Flowsheets activity to see all data.        Depression, major, single episode, severe (HCC) Uncontrolled , not suicidal ,refer to therapy, has benefited in the past

## 2020-04-20 NOTE — Assessment & Plan Note (Signed)
Uncontrolled , not suicidal ,refer to therapy, has benefited in the past

## 2020-04-25 ENCOUNTER — Ambulatory Visit
Admission: EM | Admit: 2020-04-25 | Discharge: 2020-04-25 | Disposition: A | Discharge status: Home / Self Care | Payer: BC Managed Care – PPO

## 2020-04-25 NOTE — ED Triage Notes (Signed)
Pt returns to be retested for std after treatment last week, pt denies any recent sexual activity, advised re testing is not warranted
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
## 2020-05-13 ENCOUNTER — Other Ambulatory Visit: Payer: Self-pay

## 2020-05-13 ENCOUNTER — Telehealth: Payer: BC Managed Care – PPO | Admitting: Psychiatry

## 2020-05-13 ENCOUNTER — Telehealth: Payer: Self-pay | Admitting: Psychiatry

## 2020-05-13 NOTE — Telephone Encounter (Signed)
Sent link for video visit through Epic. Patient did not sign in. Called the patient for appointment scheduled today. The patient states that she is sick and would like to reschedule the appointment. She will contact us back for reschedule.

## 2020-05-14 ENCOUNTER — Ambulatory Visit: Payer: Self-pay | Admitting: "Endocrinology

## 2020-06-04 ENCOUNTER — Encounter: Payer: BC Managed Care – PPO | Admitting: Family Medicine

## 2020-06-10 ENCOUNTER — Telehealth (INDEPENDENT_AMBULATORY_CARE_PROVIDER_SITE_OTHER): Payer: BC Managed Care – PPO | Admitting: Family Medicine

## 2020-06-10 ENCOUNTER — Other Ambulatory Visit: Payer: Self-pay

## 2020-06-10 DIAGNOSIS — R059 Cough, unspecified: Secondary | ICD-10-CM

## 2020-06-10 DIAGNOSIS — F322 Major depressive disorder, single episode, severe without psychotic features: Secondary | ICD-10-CM

## 2020-06-10 DIAGNOSIS — N760 Acute vaginitis: Secondary | ICD-10-CM | POA: Diagnosis not present

## 2020-06-10 DIAGNOSIS — I10 Essential (primary) hypertension: Secondary | ICD-10-CM

## 2020-06-10 DIAGNOSIS — E785 Hyperlipidemia, unspecified: Secondary | ICD-10-CM

## 2020-06-10 DIAGNOSIS — E1165 Type 2 diabetes mellitus with hyperglycemia: Secondary | ICD-10-CM | POA: Diagnosis not present

## 2020-06-10 MED ORDER — DEXTROMETHORPHAN HBR 15 MG/5ML PO SYRP: 10.0000 mL | ORAL_SOLUTION | Freq: Four times a day (QID) | ORAL | 0 refills | Status: DC | PRN

## 2020-06-10 NOTE — Progress Notes (Signed)
Virtual Visit via Telephone Note  I connected with [name] Fernandez on 06/10/20 at  4:00 PM EST by telephone and verified that I am speaking with the correct person using two identifiers.  Location: Patient: work Provider: office   I discussed the limitations, risks, security and privacy concerns of performing an evaluation and management service by telephone and the availability of in person appointments. I also discussed with the patient that there may be a patient responsible charge related to this service. The patient expressed understanding and agreed to proceed.   History of Present Illness: F/U chronic problems and address any new or current concerns. Review and update medications and allergies. Review recent lab and radiologic data . Update routine health maintainace. Review an encourage improved health habits to include nutrition, exercise and  sleep . C/o cough, no fever , chills or discolored sputum Not testing blood sugar , denies polyuria, polydipsia or blurred vision, does have fatigue Denies recent fever or chills. Denies sinus pressure, nasal congestion, ear pain or sore throat. Denies chest pains, palpitations and leg swelling Denies abdominal pain, nausea, vomiting,diarrhea or constipation.   Denies dysuria, frequency, hesitancy or incontinence. Denies joint pain, swelling and limitation in mobility. Denies headaches, seizures, numbness, or tingling. Denies uncontrolled  depression, anxiety or insomnia. Denies skin break down or rash.         Observations/Objective: There were no vitals taken for this visit. Good communication with no confusion and intact memory. Alert and oriented x 3 No signs of respiratory distress during speech    Assessment and Plan: Essential hypertension Uncontrolled, needs in office eval in next 2 weeks DASH diet and commitment to daily physical activity for a minimum of 30 minutes discussed and encouraged, as a part of  hypertension management. The importance of attaining a healthy weight is also discussed.  BP/Weight 04/18/2020 04/17/2020 04/02/2020 01/05/2020 11/04/2018 11/02/2018 11/01/2018  Systolic BP 181 159 - 186 176 854 118  Diastolic BP 100 91 - 116 81 70 64  Wt. (Lbs) 121 121.04 118 116 125.9 126 124.12  BMI 22.86 22.87 23.05 22.65 27.24 27.27 26.86  Some encounter information is confidential and restricted. Go to Review Flowsheets activity to see all data.       Vulvovaginitis Re test for TOC, recent Chlamydia , rept positive for trich, needs screening for syphilis and HIV also safe sex practices esp condom use  Hyperlipidemia LDL goal <100 Hyperlipidemia:Low fat diet discussed and encouraged.   Lipid Panel  Lab Results  Component Value Date   CHOL 313 (H) 03/26/2020   HDL 46 03/26/2020   LDLCALC 224 (H) 03/26/2020   TRIG 216 (H) 03/26/2020   CHOLHDL 6.8 (H) 03/26/2020   Uncontrolled, needs to lower fat intake Updated lab mid to end Feb    DM (diabetes mellitus), type 2, uncontrolled Needs to reschedule appointment with Endo, recently had   Depression, major, single episode, severe (HCC) Improved on current meds, management per psych  Cough Dextromethorphan prescribed    Follow Up Instructions:    I discussed the assessment and treatment plan with the patient. The patient was provided an opportunity to ask questions and all were answered. The patient agreed with the plan and demonstrated an understanding of the instructions.   The patient was advised to call back or seek an in-person evaluation if the symptoms worsen or if the condition fails to improve as anticipated.  I provided 22 minutes of non-face-to-face time during this encounter.   Syliva Overman, MD

## 2020-06-10 NOTE — Patient Instructions (Signed)
In office visit for blood pressure management with Dr Allena Katz in next 1 to 2 weeks  F/U with Dr Lodema Hong, video in 4 months, call if you need me sooner   Please reschedule your appointment with Dr Fransico Him, vital that you get your blood sugar controlled  Arrange with Nurse to submit specimen for STD testing as a test of cure following recent infection

## 2020-06-11 ENCOUNTER — Other Ambulatory Visit: Payer: Self-pay

## 2020-06-11 ENCOUNTER — Other Ambulatory Visit (HOSPITAL_COMMUNITY)
Admission: RE | Admit: 2020-06-11 | Discharge: 2020-06-11 | Disposition: A | Discharge status: Home / Self Care | Payer: BC Managed Care – PPO | Source: Ambulatory Visit | Attending: Family Medicine | Admitting: Family Medicine

## 2020-06-11 ENCOUNTER — Ambulatory Visit: Payer: BC Managed Care – PPO

## 2020-06-11 DIAGNOSIS — Z113 Encounter for screening for infections with a predominantly sexual mode of transmission: Secondary | ICD-10-CM | POA: Insufficient documentation

## 2020-06-12 ENCOUNTER — Other Ambulatory Visit: Payer: Self-pay | Admitting: Family Medicine

## 2020-06-12 LAB — CERVICOVAGINAL ANCILLARY ONLY
Bacterial Vaginitis (gardnerella): POSITIVE — AB
Candida Glabrata: NEGATIVE
Candida Vaginitis: NEGATIVE
Chlamydia: NEGATIVE
Comment: NEGATIVE
Comment: NEGATIVE
Comment: NEGATIVE
Comment: NEGATIVE
Comment: NEGATIVE
Comment: NORMAL
Neisseria Gonorrhea: NEGATIVE
Trichomonas: POSITIVE — AB

## 2020-06-12 MED ORDER — METRONIDAZOLE 500 MG PO TABS: 500.0000 mg | ORAL_TABLET | Freq: Two times a day (BID) | ORAL | 0 refills | Status: DC

## 2020-06-15 ENCOUNTER — Encounter: Payer: Self-pay | Admitting: Family Medicine

## 2020-06-15 ENCOUNTER — Telehealth: Payer: Self-pay | Admitting: Family Medicine

## 2020-06-15 DIAGNOSIS — I10 Essential (primary) hypertension: Secondary | ICD-10-CM

## 2020-06-15 DIAGNOSIS — Z1159 Encounter for screening for other viral diseases: Secondary | ICD-10-CM

## 2020-06-15 DIAGNOSIS — Z113 Encounter for screening for infections with a predominantly sexual mode of transmission: Secondary | ICD-10-CM

## 2020-06-15 DIAGNOSIS — R059 Cough, unspecified: Secondary | ICD-10-CM

## 2020-06-15 DIAGNOSIS — E785 Hyperlipidemia, unspecified: Secondary | ICD-10-CM

## 2020-06-15 HISTORY — DX: Cough, unspecified: R05.9

## 2020-06-15 NOTE — Assessment & Plan Note (Signed)
Improved on current meds, management per psych

## 2020-06-15 NOTE — Assessment & Plan Note (Signed)
Re test for TOC, recent Chlamydia , rept positive for trich, needs screening for syphilis and HIV also safe sex practices esp condom use

## 2020-06-15 NOTE — Assessment & Plan Note (Signed)
Needs to reschedule appointment with Endo, recently had

## 2020-06-15 NOTE — Assessment & Plan Note (Signed)
Uncontrolled, needs in office eval in next 2 weeks DASH diet and commitment to daily physical activity for a minimum of 30 minutes discussed and encouraged, as a part of hypertension management. The importance of attaining a healthy weight is also discussed.  BP/Weight 04/18/2020 04/17/2020 04/02/2020 01/05/2020 11/04/2018 11/02/2018 11/01/2018  Systolic BP 181 159 - 186 176 481 118  Diastolic BP 100 91 - 116 81 70 64  Wt. (Lbs) 121 121.04 118 116 125.9 126 124.12  BMI 22.86 22.87 23.05 22.65 27.24 27.27 26.86  Some encounter information is confidential and restricted. Go to Review Flowsheets activity to see all data.

## 2020-06-15 NOTE — Assessment & Plan Note (Signed)
Dextromethorphan prescribed

## 2020-06-15 NOTE — Assessment & Plan Note (Addendum)
Hyperlipidemia:Low fat diet discussed and encouraged.   Lipid Panel  Lab Results  Component Value Date   CHOL 313 (H) 03/26/2020   HDL 46 03/26/2020   LDLCALC 224 (H) 03/26/2020   TRIG 216 (H) 03/26/2020   CHOLHDL 6.8 (H) 03/26/2020   Uncontrolled, needs to lower fat intake Updated lab mid to end Feb

## 2020-06-15 NOTE — Telephone Encounter (Signed)
Needs fasting lipid and hepatic panel and hepatitis C screen end Feb, also had neg HIV test in Nov, just do RPR for syphilis testing only, because of STD frequency

## 2020-06-16 NOTE — Telephone Encounter (Signed)
Pt aware.

## 2020-06-16 NOTE — Addendum Note (Signed)
Addended by: Abner Greenspan on: 06/16/2020 09:59 AM   Modules accepted: Orders

## 2020-06-24 ENCOUNTER — Ambulatory Visit: Payer: BC Managed Care – PPO | Admitting: Internal Medicine

## 2020-07-01 ENCOUNTER — Other Ambulatory Visit: Payer: Self-pay | Admitting: Nurse Practitioner

## 2020-07-01 DIAGNOSIS — N760 Acute vaginitis: Secondary | ICD-10-CM

## 2020-07-01 NOTE — Progress Notes (Signed)
uswab

## 2020-07-03 ENCOUNTER — Other Ambulatory Visit: Payer: Self-pay

## 2020-07-03 ENCOUNTER — Encounter: Payer: Self-pay | Admitting: Family Medicine

## 2020-07-03 ENCOUNTER — Telehealth (INDEPENDENT_AMBULATORY_CARE_PROVIDER_SITE_OTHER): Payer: BC Managed Care – PPO | Admitting: Family Medicine

## 2020-07-03 DIAGNOSIS — A530 Latent syphilis, unspecified as early or late: Secondary | ICD-10-CM

## 2020-07-03 DIAGNOSIS — Z7251 High risk heterosexual behavior: Secondary | ICD-10-CM | POA: Diagnosis not present

## 2020-07-03 LAB — LIPID PANEL
Chol/HDL Ratio: 5.5 ratio — ABNORMAL HIGH (ref 0.0–4.4)
Cholesterol, Total: 203 mg/dL — ABNORMAL HIGH (ref 100–199)
HDL: 37 mg/dL — ABNORMAL LOW (ref >39)
LDL Chol Calc (NIH): 139 mg/dL — ABNORMAL HIGH (ref 0–99)
Triglycerides: 149 mg/dL (ref 0–149)
VLDL Cholesterol Cal: 27 mg/dL (ref 5–40)

## 2020-07-03 LAB — HEPATIC FUNCTION PANEL
ALT: 7 IU/L (ref 0–32)
AST: 7 IU/L (ref 0–40)
Albumin: 3.9 g/dL (ref 3.8–4.8)
Alkaline Phosphatase: 80 IU/L (ref 44–121)
Bilirubin Total: 0.3 mg/dL (ref 0.0–1.2)
Bilirubin, Direct: <0.1 mg/dL (ref 0.00–0.40)
Total Protein: 7.4 g/dL (ref 6.0–8.5)

## 2020-07-03 LAB — HEPATITIS C ANTIBODY: Hep C Virus Ab: <0.1 s/co ratio (ref 0.0–0.9)

## 2020-07-03 LAB — RPR, QUANT+TP ABS (REFLEX)
Rapid Plasma Reagin, Quant: 1:1 {titer} — ABNORMAL HIGH
T Pallidum Abs: NONREACTIVE

## 2020-07-03 LAB — RPR: RPR Ser Ql: REACTIVE — AB

## 2020-07-03 MED ORDER — PENICILLIN G BENZATHINE 2400000 UNIT/4ML IM SUSP
2.4000 10*6.[IU] | Freq: Once | INTRAMUSCULAR | Status: AC
  Administered 2020-07-03: 2400000 [IU] via INTRAMUSCULAR

## 2020-07-03 NOTE — Patient Instructions (Addendum)
F/U in 4 weeks, call if you need me sooner  Please come in today for Benzathine penicillin 2.4 MU intramuscularly  Appropriate follow up for test of cure will be arranged Syphilis Syphilis is an infection that can spread through sexual contact. The infection can cause serious complications, so it is important to get treatment right away. There are four stages of syphilis:  Primary stage. During this stage sores may form where the disease entered your body.  Secondary stage. During this stage skin rashes and lesions will form.  Latent stage. During this stage there are no symptoms, but the infection may still be contagious.  Tertiary stage. This stage happens 10-30 years after the infection starts. During this stage, the disease damages organs and can lead to death. Most people do not develop this stage of syphilis. What are the causes? This condition is caused by bacteria called Treponema pallidum. The condition can spread during sexual activity, such as during oral, anal, or vaginal sex. It can also be spread from mother to fetus during pregnancy. What increases the risk? You are more likely to develop this condition if:  You do not use a condom.  You have or had another sexually transmitted infection (STI).  You have multiple sex partners.  You use illegal drugs through an IV. What are the signs or symptoms? Symptoms of this condition depend on the stage of the disease. Primary stage  Painless sores (chancres) in and around the genital organs, mouth, or hands. The sores are usually firm and round. Secondary stage  A rash or sores. The rash usually does not itch.  A fever.  A headache.  A sore throat.  Swollen lymph nodes.  New sores in the mouth or on the genitals.  A feeling of being ill.  Pain in the joints.  Patchy hair loss.  Weight loss.  Fatigue. Latent stage There are no symptoms during this stage. Tertiary stage  Dementia.  Personality and mood  changes.  Difficulty walking and coordinating movements.  Muscle weakness or paralysis.  Problems with coordination.  Heart failure.  Trouble breathing.  Fainting.  Soft, rubbery growths on the skin, bones, or liver (gummas).  Sudden "lightning" pains, numbness, or tingling.  Vision changes.  Hearing changes.  Trouble controlling your urine and bowel movements. How is this diagnosed? This condition is diagnosed with:  A physical exam.  Blood tests.  Tests of the fluid (drainage) from a sore or rash.  Tests of the fluid around the spine (lumbar puncture). These tests are done to check for an infection in the brain or nervous system.  Imaging tests. These may be done to check for damage to the heart, aorta, or brain if the condition is in the tertiary stage. Tests may include: ? An X-ray. ? A CT scan. ? An MRI. ? An echocardiogram. This test takes a picture of the heart. ? An ultrasound.   How is this treated? This condition can be cured with antibiotic medicine. During the first day of treatment, the medicine may cause you to experience fever, chills, headache, nausea, or aching all over your body. This is normal and should go away within 24 hours. Follow these instructions at home: Medicines  Take over-the-counter and prescription medicines only as told by your health care provider.  Take your antibiotic medicine as told by your health care provider. Do not stop taking the antibiotic even if you start to feel better. Incomplete treatment will put you at risk for continued infection  and could be life threatening.   General instructions  Do not have sex until your treatment is completed, or as directed by your health care provider.  Tell your recent sexual partners that you were diagnosed with syphilis. It is important that they get treatment, even if they do not have symptoms.  Keep all follow-up visits as told by your health care provider. This is important. How  is this prevented?  Use latex condoms correctly whenever you have sex.  Before you have sex, ask your partner if he or she has been tested for STIs. Ask about the test results.  Avoid having multiple sexual partners. Contact a health care provider if:  You continue to have any of the following symptoms 24 hours after beginning treatment: ? Fever. ? Chills. ? Headache. ? Nausea. ? Aching all over your body.  Your symptoms do not improve, even with treatment. Get help right away if:  You have severe chest pain.  You have trouble walking or coordinating movements.  You are confused.  You lose vision or hearing.  You have numbness in your arms or legs.  You have a seizure.  You faint.  You have a severe headache that does not go away with medicine. Summary  Syphilis is an infection that can spread through sexual contact.  This condition can cause serious complications, so it is best to get treatment right away. The condition can be cured with antibiotic medicine.  This condition can also be spread from mother to fetus during pregnancy.  Take your antibiotic medicine as told by your health care provider.  Tell your recent sexual partners that you were diagnosed with syphilis. It is important that they get treatment, even if they do not have symptoms. This information is not intended to replace advice given to you by your health care provider. Make sure you discuss any questions you have with your health care provider. Document Revised: 11/07/2018 Document Reviewed: 06/22/2016 Elsevier Patient Education  2021 [employer].  Safe Sex Practicing safe sex means taking steps before and during sex to reduce your risk of:  Getting an STI (sexually transmitted infection).  Giving your partner an STI.  Unwanted or unplanned pregnancy. How to practice safe sex Ways you can practice safe sex  Limit your sexual partners to only one partner who is having sex with only  you.  Avoid using alcohol and drugs before having sex. Alcohol and drugs can affect your judgment.  Before having sex with a new partner: ? Talk to your partner about past partners, past STIs, and drug use. ? Get screened for STIs and discuss the results with your partner. Ask your partner to get screened too.  Check your body regularly for sores, blisters, rashes, or unusual discharge. If you notice any of these problems, visit your health care provider.  Avoid sexual contact if you have symptoms of an infection or you are being treated for an STI.  While having sex, use a condom. Make sure to: ? Use a condom every time you have vaginal, oral, or anal sex. Both females and males should wear condoms during oral sex. ? Keep condoms in place from the beginning to the end of sexual activity. ? Use a latex condom, if possible. Latex condoms offer the best protection. ? Use only water-based lubricants with a condom. Using petroleum-based lubricants or oils will weaken the condom and increase the chance that it will break.   Ways your health care provider can help you  practice safe sex  See your health care provider for regular screenings, exams, and tests for STIs.  Talk with your health care provider about what kind of birth control (contraception) is best for you.  Get vaccinated against hepatitis B and human papillomavirus (HPV).  If you are at risk of being infected with HIV (human immunodeficiency virus), talk with your health care provider about taking a prescription medicine to prevent HIV infection. You are at risk for HIV if you: ? Are a man who has sex with other men. ? Are sexually active with more than one partner. ? Take drugs by injection. ? Have a sex partner who has HIV. ? Have unprotected sex. ? Have sex with someone who has sex with both men and women. ? Have had an STI.   Follow these instructions at home:  Take over-the-counter and prescription medicines only as told  by your health care provider.  Keep all follow-up visits. This is important. Where to find more information  Centers for Disease Control and Prevention: FootballExhibition.com.br  Planned Parenthood: www.plannedparenthood.org  Office on [employer] [facility]: http://hoffman.com/ Summary  Practicing safe sex means taking steps before and during sex to reduce your risk getting an STI, giving your partner an STI, and having an unwanted or unplanned pregnancy.  Before having sex with a new partner, talk to your partner about past partners, past STIs, and drug use.  Use a condom every time you have vaginal, oral, or anal sex. Both females and males should wear condoms during oral sex.  Check your body regularly for sores, blisters, rashes, or unusual discharge. If you notice any of these problems, visit your health care provider.  See your health care provider for regular screenings, exams, and tests for STIs. This information is not intended to replace advice given to you by your health care provider. Make sure you discuss any questions you have with your health care provider. Document Revised: 10/01/2019 Document Reviewed: 10/01/2019 Elsevier Patient Education  2021 [employer].

## 2020-07-04 ENCOUNTER — Encounter: Payer: Self-pay | Admitting: Family Medicine

## 2020-07-04 DIAGNOSIS — Z7251 High risk heterosexual behavior: Secondary | ICD-10-CM | POA: Insufficient documentation

## 2020-07-04 LAB — HIV-1 RNA QUANT-NO REFLEX-BLD

## 2020-07-04 LAB — SPECIMEN STATUS REPORT

## 2020-07-04 NOTE — Assessment & Plan Note (Signed)
Treat with 2.4 MU Benzathine PCN and report to HD Needs non treponemal titer and this will be repeated every 6 months for 24 months if seroconvert in that time Safe sex counselling verbally and with printed info

## 2020-07-04 NOTE — Assessment & Plan Note (Signed)
3 confirmed different STD's in 1 2 month period. Conseled re safe sex practices for 10 minutes and pt engagement in change in behavior encouraged and facilitated, she seems motivated to change with latest dx of syphillis

## 2020-07-04 NOTE — Progress Notes (Signed)
Virtual Visit via Telephone Note  I connected with [name] Fernandez on 07/04/20 at 10:00 AM EST by telephone and verified that I am speaking with the correct person using two identifiers.  Location: Patient: work Provider: office   I discussed the limitations, risks, security and privacy concerns of performing an evaluation and management service by telephone and the availability of in person appointments. I also discussed with the patient that there may be a patient responsible charge related to this service. The patient expressed understanding and agreed to proceed.   History of Present Illness: vist is because of positive RPR test result, with recent positive GC in 04/2020 and trichomonas in in 06/2020, now RPR positive in 07/01/2020. I discussed sexual practice and safety with Ms Zingale , now reporting 2 current/ recent  partners and not using condoms. States she is disturbed by her positive syphillis test, and will now take more responsibility for safe sexual practice.Has no h/o genital ulcer   Observations/Objective: There were no vitals taken for this visit. Good communication with no confusion and intact memory. Alert and oriented x 3 No signs of respiratory distress during speech    Assessment and Plan: Positive RPR test Treat with 2.4 MU Benzathine PCN and report to HD Needs non treponemal titer and this will be repeated every 6 months for 24 months if seroconvert in that time Safe sex counselling verbally and with printed info  High risk sexual behavior 3 confirmed different STD's in 1 2 month period. Conseled re safe sex practices for 10 minutes and pt engagement in change in behavior encouraged and facilitated, she seems motivated to change with latest dx of syphillis     Follow Up Instructions:    I discussed the assessment and treatment plan with the patient. The patient was provided an opportunity to ask questions and all were answered. The patient agreed with  the plan and demonstrated an understanding of the instructions.   The patient was advised to call back or seek an in-person evaluation if the symptoms worsen or if the condition fails to improve as anticipated.  I provided 22  minutes of non-face-to-face time during this encounter.   Syliva Overman, MD

## 2020-07-06 LAB — NUSWAB VAGINITIS PLUS (VG+)
Candida albicans, NAA: NEGATIVE
Candida glabrata, NAA: NEGATIVE
Chlamydia trachomatis, NAA: NEGATIVE
Neisseria gonorrhoeae, NAA: NEGATIVE
Trich vag by NAA: POSITIVE — AB

## 2020-07-07 ENCOUNTER — Other Ambulatory Visit: Payer: Self-pay | Admitting: Nurse Practitioner

## 2020-07-07 ENCOUNTER — Telehealth: Payer: Self-pay

## 2020-07-07 ENCOUNTER — Other Ambulatory Visit: Payer: Self-pay

## 2020-07-07 DIAGNOSIS — Z7251 High risk heterosexual behavior: Secondary | ICD-10-CM

## 2020-07-07 MED ORDER — METRONIDAZOLE 500 MG PO TABS: 500.0000 mg | ORAL_TABLET | Freq: Two times a day (BID) | ORAL | 0 refills | Status: DC

## 2020-07-07 NOTE — Telephone Encounter (Signed)
HIV was unable to be added on . Will come back and have test redrawn today

## 2020-07-07 NOTE — Telephone Encounter (Signed)
Patient called lvm she had lab test done, she said wrong test was sent. Does she need to come in to have more blood work done. Please contact patient [phone].

## 2020-07-07 NOTE — Progress Notes (Signed)
I put in the usual order for HIV with reflex, so the new order shouldn't be a frozen specimen.

## 2020-07-07 NOTE — Progress Notes (Signed)
The Nuswab came back positive for trichomoniasis, so I sent in flagyl to take twice a day for a week.

## 2020-07-08 ENCOUNTER — Telehealth: Payer: Self-pay

## 2020-07-08 ENCOUNTER — Other Ambulatory Visit: Payer: Self-pay

## 2020-07-08 DIAGNOSIS — I10 Essential (primary) hypertension: Secondary | ICD-10-CM

## 2020-07-08 DIAGNOSIS — E1165 Type 2 diabetes mellitus with hyperglycemia: Secondary | ICD-10-CM

## 2020-07-08 MED ORDER — LISINOPRIL-HYDROCHLOROTHIAZIDE 10-12.5 MG PO TABS: 1.0000 | ORAL_TABLET | Freq: Every day | ORAL | 1 refills | Status: DC

## 2020-07-08 NOTE — Telephone Encounter (Signed)
Med refill:  lisinopril-hydrochlorothiazide (ZESTORETIC) 10-12.5 MG tablet   Pharmacy: [facility]

## 2020-07-08 NOTE — Telephone Encounter (Signed)
Refilled

## 2020-07-09 ENCOUNTER — Other Ambulatory Visit: Payer: Self-pay | Admitting: Nurse Practitioner

## 2020-07-09 LAB — HIV ANTIBODY (ROUTINE TESTING W REFLEX): HIV Screen 4th Generation wRfx: NONREACTIVE

## 2020-07-09 MED ORDER — FLUCONAZOLE 150 MG PO TABS: ORAL_TABLET | ORAL | 0 refills | Status: DC

## 2020-07-09 NOTE — Progress Notes (Signed)
HIV was negative

## 2020-07-09 NOTE — Progress Notes (Signed)
Sent to [facility]

## 2020-07-11 ENCOUNTER — Ambulatory Visit (INDEPENDENT_AMBULATORY_CARE_PROVIDER_SITE_OTHER): Payer: BC Managed Care – PPO | Admitting: Pulmonary Disease

## 2020-07-11 ENCOUNTER — Encounter: Payer: Self-pay | Admitting: Pulmonary Disease

## 2020-07-11 ENCOUNTER — Other Ambulatory Visit: Payer: Self-pay

## 2020-07-11 VITALS — BP 136/86 | HR 83 | Temp 97.4°F | Ht 60.0 in | Wt 118.4 lb

## 2020-07-11 DIAGNOSIS — G4710 Hypersomnia, unspecified: Secondary | ICD-10-CM

## 2020-07-11 DIAGNOSIS — R0683 Snoring: Secondary | ICD-10-CM

## 2020-07-11 DIAGNOSIS — T733XXA Exhaustion due to excessive exertion, initial encounter: Secondary | ICD-10-CM

## 2020-07-11 NOTE — Patient Instructions (Signed)
Call if you feel like your sleep is getting worse and you would like to arrange for a home sleep study

## 2020-07-11 NOTE — Progress Notes (Signed)
[facility]  Chief Complaint  Patient presents with   Consult    Needs to see sleep doctor due to insomnia    Constitutional:  BP 136/86 (BP Location: Left Arm, Cuff Size: Normal)    Pulse 83    Temp (!) 97.4 F (36.3 C) (Other (Comment)) Comment (Src): wrist   Ht 5' (1.524 m)    Wt 118 lb 6.4 oz (53.7 kg)    SpO2 100% Comment: Room air   BMI 23.12 kg/m   Past Medical History:  Anxiety, OA, HTN, STD, Depression, DM type 2, GERD, HA, HLD, HTN  Past Surgical History:  She  has a past surgical history that includes Breast surgery and Cholecystectomy (N/A, 12/27/2013).  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      Subjective:   She has history of depression and is followed by psychiatry.  She was noted to have some difficulties with her sleep and daytime fatigue.  She feels this is more related to her work schedule and child care duties.  She works 9 to 5, Monday to Friday.  She has a special needs daughter, and she is a single parent.    She starts to wind down in the even around 9 pm, but she goes to sleep at different times depending on what she needs to wrap up at home.  She falls asleep quickly.  She wakes up 1 or 2 times to use the bathroom.  She gets out of bed at 530 am work days, and 7 am on weekends.  She feels okay in the morning.  She denies morning headache.  She does not use anything to help her stay awake.  She tried trazodone to help her sleep, but this had a hangover effect.  She snores some, but thinks this improved after she lost some weight.  She sometimes takes naps on the weekend.  She gets stress at times related to her finances and caring for her daughter.  However, overall she feels her mood is okay at present.  She denies sleep walking, sleep talking, bruxism, or nightmares.  There is no history of restless legs.  She denies sleep hallucinations, sleep paralysis, or  cataplexy.  The Epworth score is 7 out of 24.    Physical Exam:   Appearance - well kempt   ENMT - no sinus tenderness, no oral exudate, no LAN, Mallampati 3 airway, no stridor  Respiratory - equal breath sounds bilaterally, no wheezing or rales  CV - s1s2 regular rate and rhythm, no murmurs  Ext - no clubbing, no edema  Skin - no rashes  Psych - normal mood and affect   Sleep Tests:    Social History:  She  reports that she has never smoked. She has never used smokeless tobacco. She reports previous alcohol use. She reports that she does not use drugs.  Family History:  Her family history includes Alcohol abuse in her mother; Anxiety disorder in her maternal aunt; COPD in her father; Depression in her brother and mother; Diabetes in her father; Hypertension in her brother; Sickle cell anemia in her brother.    Discussion:  She has daytime fatigue and sleep disruption.  She reports mild snoring that has improved after recent weight loss.  She feels her issues currently are related to her work and child care duties, and the stress these bring on.  She doesn't feel like assessment for  sleep apnea or other sleep disorder is warranted at this time.  Plan:  Advised her to contact our office if her sleep issues get worse and she would like to consider undergoing a home sleep study.  Time Spent Involved in Patient Care on Day of Examination:  31 minutes  Follow up:  Patient Instructions  Call if you feel like your sleep is getting worse and you would like to arrange for a home sleep study   Medication List:   Allergies as of 07/11/2020      Reactions   Janumet [sitagliptin-metformin Hcl] Nausea And Vomiting      Medication List       Accurate as of July 11, 2020  9:29 AM. If you have any questions, ask your nurse or doctor.        STOP taking these medications   dextromethorphan 15 MG/5ML syrup Stopped by: Coralyn Helling, MD   Vitamin D (Ergocalciferol) 1.25 MG  (50000 UNIT) Caps capsule Commonly known as: DRISDOL Stopped by: Coralyn Helling, MD     TAKE these medications   amLODipine 5 MG tablet Commonly known as: NORVASC Take 1 tablet (5 mg total) by mouth daily.   fluconazole 150 MG tablet Commonly known as: DIFLUCAN Take 1 tablet now, and repeat dose in 72 hours   FLUoxetine 40 MG capsule Commonly known as: PROZAC 60 mg daily, take along with 20 mg cap   FLUoxetine 20 MG capsule Commonly known as: PROzac 60 mg daily, take along with 40 mg cap   glipiZIDE 10 MG 24 hr tablet Commonly known as: GLUCOTROL XL Take 1 tablet (10 mg total) by mouth daily with breakfast.   insulin glargine 100 UNIT/ML Solostar Pen Commonly known as: LANTUS Inject 10 Units into the skin at bedtime.   lisinopril-hydrochlorothiazide 10-12.5 MG tablet Commonly known as: ZESTORETIC Take 1 tablet by mouth daily.   metroNIDAZOLE 500 MG tablet Commonly known as: FLAGYL Take 1 tablet (500 mg total) by mouth 2 (two) times daily.   multivitamin capsule Take 1 capsule by mouth daily.   rosuvastatin 40 MG tablet Commonly known as: Crestor Take 1 tablet (40 mg total) by mouth daily.       Signature:  Coralyn Helling, MD [facility] Pager - [phone] 07/11/2020, 9:29 AM

## 2020-07-18 ENCOUNTER — Ambulatory Visit
Admission: EM | Admit: 2020-07-18 | Discharge: 2020-07-18 | Disposition: A | Discharge status: Home / Self Care | Payer: BC Managed Care – PPO | Attending: Emergency Medicine | Admitting: Emergency Medicine

## 2020-07-18 ENCOUNTER — Other Ambulatory Visit: Payer: Self-pay

## 2020-07-18 ENCOUNTER — Encounter: Payer: Self-pay | Admitting: Emergency Medicine

## 2020-07-18 DIAGNOSIS — Z113 Encounter for screening for infections with a predominantly sexual mode of transmission: Secondary | ICD-10-CM | POA: Insufficient documentation

## 2020-07-18 DIAGNOSIS — Z202 Contact with and (suspected) exposure to infections with a predominantly sexual mode of transmission: Secondary | ICD-10-CM | POA: Diagnosis not present

## 2020-07-18 DIAGNOSIS — Z79899 Other long term (current) drug therapy: Secondary | ICD-10-CM | POA: Diagnosis not present

## 2020-07-18 NOTE — Discharge Instructions (Signed)
  Take medications as prescribed and to completion We will follow up with you regarding the results of your test If tests are positive, please abstain from sexual activity until you and your partner(s) are treated Follow up with PCP or [facility] if symptoms persists Return here or go to ER if you have any new or worsening

## 2020-07-18 NOTE — ED Provider Notes (Signed)
[facility]   [id number] 07/18/20 Arrival Time: 1509   IR:JJOACZY FOR STD  SUBJECTIVE:  [name] Fernandez is a 45 y.o. female who presents requesting STI screening.  Currently asymptomatic.  Partner asymptomatic.  Last unprotected sexual encounter within few days.  Sexually active with 1 female partner.  Denies similar symptoms in the past.  Denies fever, chills, nausea, vomiting, abdominal or pelvic pain, urinary symptoms, vaginal itching, vaginal odor, vaginal bleeding, dyspareunia, vaginal rashes or lesions.     Patient's last menstrual period was 06/30/2020.  ROS: As per HPI.  All other pertinent ROS negative.     Past Medical History:  Diagnosis Date  . Abnormal bleeding in menstrual cycle 06/05/2016  . Anxiety   . Arthritis   . Chlamydia infection 03/15/2016  . Chronic hypertension affecting pregnancy 10/11/2018  . Depression   . Depression    Phreesia 04/02/2020  . Diabetes mellitus   . Diabetes mellitus without complication (HCC)    Phreesia 04/02/2020  . GERD (gastroesophageal reflux disease)   . Headache(784.0)   . Hyperlipidemia   . Hypertension   . Multigravida of advanced maternal age in first trimester 10/11/2018   Past Surgical History:  Procedure Laterality Date  . BREAST SURGERY     right lumpectomy, benign  . CHOLECYSTECTOMY N/A 12/27/2013   Procedure: LAPAROSCOPIC CHOLECYSTECTOMY ;  Surgeon: Marlane Hatcher, MD;  Location: [facility];  Service: General;  Laterality: N/A;   Allergies  Allergen Reactions  . Janumet [Sitagliptin-Metformin Hcl] Nausea And Vomiting   Current Facility-Administered Medications on File Prior to Encounter  Medication Dose Route Frequency Provider Last Rate Last Admin  . pramoxine-hydrocortisone (PROCTOCREAM-HC) rectal cream   Rectal TID Tilda Burrow, MD       Current Outpatient Medications on File Prior to Encounter  Medication Sig Dispense Refill  . amLODipine (NORVASC) 5 MG tablet Take 1 tablet (5 mg total) by  mouth daily. 30 tablet 3  . fluconazole (DIFLUCAN) 150 MG tablet Take 1 tablet now, and repeat dose in 72 hours 2 tablet 0  . FLUoxetine (PROZAC) 20 MG capsule 60 mg daily, take along with 40 mg cap 30 capsule 1  . FLUoxetine (PROZAC) 40 MG capsule 60 mg daily, take along with 20 mg cap 30 capsule 1  . glipiZIDE (GLUCOTROL XL) 10 MG 24 hr tablet Take 1 tablet (10 mg total) by mouth daily with breakfast. 30 tablet 3  . insulin glargine (LANTUS) 100 UNIT/ML Solostar Pen Inject 10 Units into the skin at bedtime. 15 mL 3  . lisinopril-hydrochlorothiazide (ZESTORETIC) 10-12.5 MG tablet Take 1 tablet by mouth daily. 90 tablet 1  . metroNIDAZOLE (FLAGYL) 500 MG tablet Take 1 tablet (500 mg total) by mouth 2 (two) times daily. 14 tablet 0  . Multiple Vitamin (MULTIVITAMIN) capsule Take 1 capsule by mouth daily.    . rosuvastatin (CRESTOR) 40 MG tablet Take 1 tablet (40 mg total) by mouth daily. 30 tablet 2   Social History   Socioeconomic History  . Marital status: Divorced    Spouse name: Not on file  . Number of children: 3  . Years of education: Not on file  . Highest education level: Not on file  Occupational History  . Not on file  Tobacco Use  . Smoking status: Never Smoker  . Smokeless tobacco: Never Used  Substance and Sexual Activity  . Alcohol use: Not Currently    Alcohol/week: 0.0 standard drinks    Comment: occ. wine a glass  .  Drug use: No  . Sexual activity: Yes    Birth control/protection: None  Other Topics Concern  . Not on file  Social History Narrative  . Not on file   Social Determinants of Health   Financial Resource Strain: Not on file  Food Insecurity: Not on file  Transportation Needs: Not on file  Physical Activity: Not on file  Stress: Not on file  Social Connections: Not on file  Intimate Partner Violence: Not on file   Family History  Problem Relation Age of Onset  . Depression Mother   . Alcohol abuse Mother   . Depression Brother   . Sickle  cell anemia Brother   . Hypertension Brother   . Diabetes Father   . COPD Father   . Anxiety disorder Maternal Aunt     OBJECTIVE:  Vitals:   07/18/20 1517  BP: 134/86  Pulse: 84  Resp: 18  Temp: 98.8 F (37.1 C)  TempSrc: Oral  SpO2: 98%     General appearance: alert, NAD, appears stated age Head: NCAT Throat: lips, mucosa, and tongue normal; teeth and gums normal Lungs: CTA bilaterally without adventitious breath sounds Heart: regular rate and rhythm.  Radial pulses 2+ symmetrical bilaterally Back: no CVA tenderness Abdomen: soft, non-tender; bowel sounds normal; no masses or organomegaly; no guarding or rebound tenderness GU: deferred Skin: warm and dry Psychological:  Alert and cooperative. Normal mood and affect.  LABS:  Results for orders placed or performed in visit on 07/07/20  HIV Antibody (routine testing w rflx)  Result Value Ref Range   HIV Screen 4th Generation wRfx Non Reactive Non Reactive    Labs Reviewed  RPR  CERVICOVAGINAL ANCILLARY ONLY    ASSESSMENT & PLAN:  1. STD exposure     No orders of the defined types were placed in this encounter.   Pending: Labs Reviewed  RPR  CERVICOVAGINAL ANCILLARY ONLY    Discharge instructions  Take medications as prescribed and to completion We will follow up with you regarding the results of your test If tests are positive, please abstain from sexual activity until you and your partner(s) are treated Follow up with PCP or [facility] if symptoms persists Return here or go to ER if you have any new or worsening symptoms    Reviewed expectations re: course of current medical issues. Questions answered. Outlined signs and symptoms indicating need for more acute intervention. Patient verbalized understanding. After Visit Summary given.       Durward Parcel, FNP 07/18/20 1542

## 2020-07-18 NOTE — ED Triage Notes (Signed)
History of STD wants a retest to make sure infection is gone.

## 2020-07-19 LAB — RPR: RPR Ser Ql: NONREACTIVE

## 2020-07-20 LAB — CERVICOVAGINAL ANCILLARY ONLY
Bacterial Vaginitis (gardnerella): NEGATIVE
Candida Glabrata: NEGATIVE
Candida Vaginitis: NEGATIVE
Chlamydia: NEGATIVE
Comment: NEGATIVE
Comment: NEGATIVE
Comment: NEGATIVE
Comment: NEGATIVE
Comment: NEGATIVE
Comment: NORMAL
Neisseria Gonorrhea: NEGATIVE
Trichomonas: POSITIVE — AB

## 2020-07-21 ENCOUNTER — Telehealth (HOSPITAL_COMMUNITY): Payer: Self-pay | Admitting: Emergency Medicine

## 2020-07-22 ENCOUNTER — Telehealth: Payer: Self-pay

## 2020-07-22 MED ORDER — TINIDAZOLE 500 MG PO TABS: 2.0000 g | ORAL_TABLET | Freq: Once | ORAL | 0 refills | Status: AC

## 2020-07-22 NOTE — Telephone Encounter (Signed)
Patient called asking for lab results. Please contact patient at [phone].

## 2020-07-28 ENCOUNTER — Other Ambulatory Visit: Payer: Self-pay

## 2020-07-28 ENCOUNTER — Telehealth (INDEPENDENT_AMBULATORY_CARE_PROVIDER_SITE_OTHER): Payer: BC Managed Care – PPO | Admitting: Internal Medicine

## 2020-07-28 ENCOUNTER — Other Ambulatory Visit (HOSPITAL_COMMUNITY)
Admission: RE | Admit: 2020-07-28 | Discharge: 2020-07-28 | Disposition: A | Discharge status: Home / Self Care | Payer: BC Managed Care – PPO | Source: Ambulatory Visit | Attending: Internal Medicine | Admitting: Internal Medicine

## 2020-07-28 VITALS — Ht 60.0 in | Wt 116.0 lb

## 2020-07-28 DIAGNOSIS — N760 Acute vaginitis: Secondary | ICD-10-CM

## 2020-07-28 MED ORDER — FLUCONAZOLE 150 MG PO TABS: 150.0000 mg | ORAL_TABLET | Freq: Once | ORAL | 0 refills | Status: AC

## 2020-07-28 NOTE — Progress Notes (Signed)
Virtual Visit via Telephone Note   This visit type was conducted due to national recommendations for restrictions regarding the COVID-19 Pandemic (e.g. social distancing) in an effort to limit this patient's exposure and mitigate transmission in our community.  Due to her co-morbid illnesses, this patient is at least at moderate risk for complications without adequate follow up.  This format is felt to be most appropriate for this patient at this time.  The patient did not have access to video technology/had technical difficulties with video requiring transitioning to audio format only (telephone).  All issues noted in this document were discussed and addressed.  No physical exam could be performed with this format.  Please refer to the patient's chart for her  consent to telehealth for [facility].   Evaluation Performed:  Follow-up visit  Date:  07/28/2020   ID:  [name] Fernandez, DOB [birth year], MRN [id number]  Patient Location: Home Provider Location: Office/Clinic  Location of Patient: Home Location of Provider: Telehealth Consent was obtain for visit to be over via telehealth. I verified that I am speaking with the correct person using two identifiers.  PCP:  Kerri Perches, MD   Chief Complaint:  Frequent STD  History of Present Illness:    [name] Fernandez is a 45 y.o. female who has a televisit for c/o perineal area itching. She recently completed Flagyl and later Tinidazole for Trichomonas vaginitis. She denies any vaginal discharge now. Denies any fever, chills, dysuria, hematuria, or pelvic pain.  The patient does not have symptoms concerning for COVID-19 infection (fever, chills, cough, or new shortness of breath).   Past Medical, Surgical, Social History, Allergies, and Medications have been Reviewed.  Past Medical History:  Diagnosis Date  . Abnormal bleeding in menstrual cycle 06/05/2016  . Anxiety   . Arthritis   . Chlamydia infection 03/15/2016  .  Chronic hypertension affecting pregnancy 10/11/2018  . Depression   . Depression    Phreesia 04/02/2020  . Diabetes mellitus   . Diabetes mellitus without complication (HCC)    Phreesia 04/02/2020  . GERD (gastroesophageal reflux disease)   . Headache(784.0)   . Hyperlipidemia   . Hypertension   . Multigravida of advanced maternal age in first trimester 10/11/2018   Past Surgical History:  Procedure Laterality Date  . BREAST SURGERY     right lumpectomy, benign  . CHOLECYSTECTOMY N/A 12/27/2013   Procedure: LAPAROSCOPIC CHOLECYSTECTOMY ;  Surgeon: Marlane Hatcher, MD;  Location: [facility];  Service: General;  Laterality: N/A;     Current Meds  Medication Sig  . amLODipine (NORVASC) 5 MG tablet Take 1 tablet (5 mg total) by mouth daily.  Marland Kitchen FLUoxetine (PROZAC) 20 MG capsule 60 mg daily, take along with 40 mg cap  . FLUoxetine (PROZAC) 40 MG capsule 60 mg daily, take along with 20 mg cap  . glipiZIDE (GLUCOTROL XL) 10 MG 24 hr tablet Take 1 tablet (10 mg total) by mouth daily with breakfast.  . insulin glargine (LANTUS) 100 UNIT/ML Solostar Pen Inject 10 Units into the skin at bedtime.  Marland Kitchen lisinopril-hydrochlorothiazide (ZESTORETIC) 10-12.5 MG tablet Take 1 tablet by mouth daily.  . Multiple Vitamin (MULTIVITAMIN) capsule Take 1 capsule by mouth daily.  . rosuvastatin (CRESTOR) 40 MG tablet Take 1 tablet (40 mg total) by mouth daily.  Marland Kitchen tinidazole (TINDAMAX) 500 MG tablet Take 2,000 mg by mouth once.  . [DISCONTINUED] fluconazole (DIFLUCAN) 150 MG tablet Take 1 tablet now, and repeat dose in 72  hours  . [DISCONTINUED] metroNIDAZOLE (FLAGYL) 500 MG tablet Take 1 tablet (500 mg total) by mouth 2 (two) times daily.   Current Facility-Administered Medications for the 07/28/20 encounter (Video Visit) with Anabel Halon, MD  Medication  . pramoxine-hydrocortisone (PROCTOCREAM-HC) rectal cream     Allergies:   Janumet [sitagliptin-metformin hcl]   ROS:   Please see the history of present  illness.     All other systems reviewed and are negative.   Labs/Other Tests and Data Reviewed:    Recent Labs: 03/26/2020: BUN 8; Creatinine, Ser 0.80; Hemoglobin 12.8; Platelets 366; Potassium 4.4; Sodium 139; TSH 1.700 07/01/2020: ALT 7   Recent Lipid Panel Lab Results  Component Value Date/Time   CHOL 203 (H) 07/01/2020 08:05 AM   TRIG 149 07/01/2020 08:05 AM   HDL 37 (L) 07/01/2020 08:05 AM   CHOLHDL 5.5 (H) 07/01/2020 08:05 AM   CHOLHDL 6.9 (H) 10/30/2018 11:50 AM   LDLCALC 139 (H) 07/01/2020 08:05 AM   LDLCALC 185 (H) 10/30/2018 11:50 AM    Wt Readings from Last 3 Encounters:  07/28/20 116 lb (52.6 kg)  07/11/20 118 lb 6.4 oz (53.7 kg)  04/18/20 121 lb (54.9 kg)      ASSESSMENT & PLAN:    Vulvovaginitis Perineal area rash Recently completed Flagyl and Tinidazole for trichomonas infection Prescribed Fluconazole Check cervicovaginal smear   Time:   Today, I have spent 9 minutes reviewing the chart, including problem list, medications, and with the patient with telehealth technology discussing the above problems.   Medication Adjustments/Labs and Tests Ordered: Current medicines are reviewed at length with the patient today.  Concerns regarding medicines are outlined above.   Tests Ordered: No orders of the defined types were placed in this encounter.   Medication Changes: No orders of the defined types were placed in this encounter.    Note: This dictation was prepared with Dragon dictation along with smaller phrase technology. Similar sounding words can be transcribed inadequately or may not be corrected upon review. Any transcriptional errors that result from this process are unintentional.      Disposition:  Follow up  Signed, Anabel Halon, MD  07/28/2020 1:20 PM     [facility]

## 2020-07-28 NOTE — Patient Instructions (Signed)
Please stop by at office to get tested for vaginitis.  Please keep the perineal area clean and dry.  Please continue to use barrier contraceptives.

## 2020-07-28 NOTE — Telephone Encounter (Signed)
The patient does not have any lab results from here. She was seen at urgent care and per mychart has viewed those results

## 2020-07-28 NOTE — Addendum Note (Signed)
Addended by: Abner Greenspan on: 07/28/2020 04:59 PM   Modules accepted: Orders

## 2020-07-29 NOTE — Telephone Encounter (Signed)
Came yesterday and was retested

## 2020-07-30 ENCOUNTER — Other Ambulatory Visit: Payer: Self-pay | Admitting: Internal Medicine

## 2020-07-30 DIAGNOSIS — A5901 Trichomonal vulvovaginitis: Secondary | ICD-10-CM

## 2020-07-30 LAB — CERVICOVAGINAL ANCILLARY ONLY
Chlamydia: NEGATIVE
Comment: NEGATIVE
Comment: NEGATIVE
Comment: NORMAL
Neisseria Gonorrhea: NEGATIVE
Trichomonas: POSITIVE — AB

## 2020-07-30 MED ORDER — METRONIDAZOLE 500 MG PO TABS
500.0000 mg | ORAL_TABLET | Freq: Two times a day (BID) | ORAL | 0 refills | Status: AC
Start: 2020-07-30 — End: 2020-08-06

## 2020-07-31 ENCOUNTER — Ambulatory Visit: Payer: BC Managed Care – PPO | Admitting: Family Medicine

## 2020-07-31 ENCOUNTER — Other Ambulatory Visit: Payer: Self-pay

## 2020-07-31 DIAGNOSIS — E1165 Type 2 diabetes mellitus with hyperglycemia: Secondary | ICD-10-CM

## 2020-07-31 DIAGNOSIS — I10 Essential (primary) hypertension: Secondary | ICD-10-CM

## 2020-07-31 DIAGNOSIS — E785 Hyperlipidemia, unspecified: Secondary | ICD-10-CM

## 2020-07-31 MED ORDER — ROSUVASTATIN CALCIUM 40 MG PO TABS: 40.0000 mg | ORAL_TABLET | Freq: Every day | ORAL | 1 refills | Status: DC

## 2020-07-31 MED ORDER — LISINOPRIL-HYDROCHLOROTHIAZIDE 10-12.5 MG PO TABS: 1.0000 | ORAL_TABLET | Freq: Every day | ORAL | 1 refills | Status: DC

## 2020-07-31 MED ORDER — INSULIN GLARGINE 100 UNIT/ML SOLOSTAR PEN: 10.0000 [IU] | PEN_INJECTOR | Freq: Every day | SUBCUTANEOUS | 1 refills | Status: DC

## 2020-07-31 MED ORDER — AMLODIPINE BESYLATE 5 MG PO TABS: 5.0000 mg | ORAL_TABLET | Freq: Every day | ORAL | 1 refills | Status: DC

## 2020-07-31 MED ORDER — GLIPIZIDE ER 10 MG PO TB24: 10.0000 mg | ORAL_TABLET | Freq: Every day | ORAL | 1 refills | Status: DC

## 2020-08-06 LAB — HM DIABETES EYE EXAM

## 2020-08-12 ENCOUNTER — Ambulatory Visit: Payer: BC Managed Care – PPO | Admitting: Family Medicine

## 2020-08-25 ENCOUNTER — Encounter: Payer: Self-pay | Admitting: Family Medicine

## 2020-08-25 ENCOUNTER — Other Ambulatory Visit: Payer: Self-pay

## 2020-08-25 ENCOUNTER — Ambulatory Visit: Payer: BC Managed Care – PPO | Admitting: Family Medicine

## 2020-08-25 ENCOUNTER — Other Ambulatory Visit (HOSPITAL_COMMUNITY)
Admission: RE | Admit: 2020-08-25 | Discharge: 2020-08-25 | Disposition: A | Discharge status: Home / Self Care | Payer: BC Managed Care – PPO | Source: Ambulatory Visit | Attending: Family Medicine | Admitting: Family Medicine

## 2020-08-25 VITALS — BP 160/90 | HR 72 | Resp 14 | Ht 60.0 in | Wt 120.0 lb

## 2020-08-25 DIAGNOSIS — N760 Acute vaginitis: Secondary | ICD-10-CM

## 2020-08-25 DIAGNOSIS — E1165 Type 2 diabetes mellitus with hyperglycemia: Secondary | ICD-10-CM | POA: Diagnosis not present

## 2020-08-25 DIAGNOSIS — E785 Hyperlipidemia, unspecified: Secondary | ICD-10-CM | POA: Diagnosis not present

## 2020-08-25 DIAGNOSIS — I10 Essential (primary) hypertension: Secondary | ICD-10-CM | POA: Diagnosis not present

## 2020-08-25 DIAGNOSIS — A5901 Trichomonal vulvovaginitis: Secondary | ICD-10-CM | POA: Diagnosis present

## 2020-08-25 DIAGNOSIS — Z1231 Encounter for screening mammogram for malignant neoplasm of breast: Secondary | ICD-10-CM

## 2020-08-25 DIAGNOSIS — Z7251 High risk heterosexual behavior: Secondary | ICD-10-CM

## 2020-08-25 LAB — POCT GLYCOSYLATED HEMOGLOBIN (HGB A1C): HbA1c, POC (controlled diabetic range): 8.3 % — AB (ref 0.0–7.0)

## 2020-08-25 MED ORDER — INSULIN GLARGINE 100 UNITS/ML SOLOSTAR PEN: 25.0000 [IU] | PEN_INJECTOR | Freq: Every day | SUBCUTANEOUS | 1 refills | Status: DC

## 2020-08-25 NOTE — Assessment & Plan Note (Signed)
Uncontrolled , has not taken med today, re eval next visit DASH diet and commitment to daily physical activity for a minimum of 30 minutes discussed and encouraged, as a part of hypertension management. The importance of attaining a healthy weight is also discussed.  BP/Weight 07/28/2020 07/18/2020 07/11/2020 04/18/2020 04/17/2020 04/02/2020 01/05/2020  Systolic BP - 134 136 181 159 - 186  Diastolic BP - 86 86 100 91 - 116  Wt. (Lbs) 116 - 118.4 121 121.04 118 116  BMI 22.65 - 23.12 22.86 22.87 23.05 22.65  Some encounter information is confidential and restricted. Go to Review Flowsheets activity to see all data.

## 2020-08-25 NOTE — Assessment & Plan Note (Addendum)
Hyperlipidemia:Low fat diet discussed and encouraged.  Not at goal needs to lowe fat intake  Updated lab needed at/ before next visit.   Lipid Panel  Lab Results  Component Value Date   CHOL 203 (H) 07/01/2020   HDL 37 (L) 07/01/2020   LDLCALC 139 (H) 07/01/2020   TRIG 149 07/01/2020   CHOLHDL 5.5 (H) 07/01/2020

## 2020-08-25 NOTE — Assessment & Plan Note (Addendum)
Re test with self collected swab, was recently positive for trichomonas

## 2020-08-25 NOTE — Assessment & Plan Note (Signed)
Counseled re needs to change behavior, limit activity to one partner and use condoms

## 2020-08-25 NOTE — Progress Notes (Signed)
 [name] Fernandez     MRN: [id number]      DOB: 09/29/1975   HPI [name] Fernandez is here for follow up and re-evaluation of chronic medical conditions, medication management and review of any available recent lab and radiology data.  Preventive health is updated, specifically  Cancer screening and Immunization.   Questions or concerns regarding consultations or procedures which the PT has had in the interim are  addressed. The PT denies any adverse reactions to current medications since the last visit.  Taking blood sugar meds, but 20 units insulin, not testing  Denies polyuria, polydipsia, blurred vision , or hypoglycemic episodes. Has not had BP meds today Currently sexually active but with one partner and trying yo avoid recurrent STd  ROS Denies recent fever or chills. Denies sinus pressure, nasal congestion, ear pain or sore throat. Denies chest congestion, productive cough or wheezing. Denies chest pains, palpitations and leg swelling Denies abdominal pain, nausea, vomiting,diarrhea or constipation.   Denies dysuria, frequency, hesitancy or incontinence. Denies joint pain, swelling and limitation in mobility. Denies headaches, seizures, numbness, or tingling. Denies depression, anxiety or insomnia. Denies skin break down or rash.   PE  BP (!) 160/90   Pulse 72   Resp 14   Ht 5' (1.524 m)   Wt 120 lb (54.4 kg)   SpO2 98%   BMI 23.44 kg/m    Patient alert and oriented and in no cardiopulmonary distress.  HEENT: No facial asymmetry, EOMI,     Neck supple .  Chest: Clear to auscultation bilaterally.  CVS: S1, S2 no murmurs, no S3.Regular rate.  ABD: Soft non tender.   Ext: No edema  MS: Adequate ROM spine, shoulders, hips and knees.  Skin: Intact, no ulcerations or rash noted.  Psych: Good eye contact, normal affect. Memory intact not anxious or depressed appearing.  CNS: CN 2-12 intact, power,  normal throughout.no focal deficits noted.   Assessment &  Plan  DM (diabetes mellitus), type 2, uncontrolled Improved, increase lantus to 25 units DASH diet and commitment to daily physical activity for a minimum of 30 minutes discussed and encouraged, as a part of hypertension management. The importance of attaining a healthy weight is also discussed.  BP/Weight 07/28/2020 07/18/2020 07/11/2020 04/18/2020 04/17/2020 04/02/2020 01/05/2020  Systolic BP - 134 136 181 159 - 186  Diastolic BP - 86 86 100 91 - 116  Wt. (Lbs) 116 - 118.4 121 121.04 118 116  BMI 22.65 - 23.12 22.86 22.87 23.05 22.65  Some encounter information is confidential and restricted. Go to Review Flowsheets activity to see all data.       Essential hypertension Uncontrolled , has not taken med today, re eval next visit DASH diet and commitment to daily physical activity for a minimum of 30 minutes discussed and encouraged, as a part of hypertension management. The importance of attaining a healthy weight is also discussed.  BP/Weight 07/28/2020 07/18/2020 07/11/2020 04/18/2020 04/17/2020 04/02/2020 01/05/2020  Systolic BP - 134 136 181 159 - 186  Diastolic BP - 86 86 100 91 - 116  Wt. (Lbs) 116 - 118.4 121 121.04 118 116  BMI 22.65 - 23.12 22.86 22.87 23.05 22.65  Some encounter information is confidential and restricted. Go to Review Flowsheets activity to see all data.       Vulvovaginitis Re test with self collected swab, was recently positive for trichomonas  Hyperlipidemia LDL goal <100 Hyperlipidemia:Low fat diet discussed and encouraged.  Not at goal needs to lowe   fat intake  Updated lab needed at/ before next visit.   Lipid Panel  Lab Results  Component Value Date   CHOL 203 (H) 07/01/2020   HDL 37 (L) 07/01/2020   LDLCALC 139 (H) 07/01/2020   TRIG 149 07/01/2020   CHOLHDL 5.5 (H) 07/01/2020       High risk sexual behavior Counseled re needs to change behavior, limit activity to one partner and use condoms

## 2020-08-25 NOTE — Assessment & Plan Note (Signed)
Improved, increase lantus to 25 units DASH diet and commitment to daily physical activity for a minimum of 30 minutes discussed and encouraged, as a part of hypertension management. The importance of attaining a healthy weight is also discussed.  BP/Weight 07/28/2020 07/18/2020 07/11/2020 04/18/2020 04/17/2020 04/02/2020 01/05/2020  Systolic BP - 134 136 181 159 - 186  Diastolic BP - 86 86 100 91 - 116  Wt. (Lbs) 116 - 118.4 121 121.04 118 116  BMI 22.65 - 23.12 22.86 22.87 23.05 22.65  Some encounter information is confidential and restricted. Go to Review Flowsheets activity to see all data.

## 2020-08-25 NOTE — Patient Instructions (Addendum)
Annual exam with pap and testing in 6 weeks , call if you need mme sooner  Increase lantus to 25 units daily and test and record twice daily  Self collected specimen for wet prep testing , and lifestyle changes as discussed   nEED to take bP meds daily as prescribed  Fasting lipid, cmp and eGFR 1 week before next visit  Please schedule mammogram at checkout \ Thanks for choosing [facility], we consider it a privelige to serve you.

## 2020-08-25 NOTE — Progress Notes (Signed)
[name] Fernandez     MRN: [id number]      DOB: [birth year]   HPI [name] Fernandez is here for follow up and re-evaluation of chronic medical conditions, medication management and review of any available recent lab and radiology data.  Preventive health is updated, specifically  Cancer screening and Immunization.   Questions or concerns regarding consultations or procedures which the PT has had in the interim are  addressed. The PT denies any adverse reactions to current medications since the last visit.  Taking blood sugar meds, but 20 units insulin, not testing  Denies polyuria, polydipsia, blurred vision , or hypoglycemic episodes. Has not had BP meds today Currently sexually active but with one partner and trying yo avoid recurrent STd  ROS Denies recent fever or chills. Denies sinus pressure, nasal congestion, ear pain or sore throat. Denies chest congestion, productive cough or wheezing. Denies chest pains, palpitations and leg swelling Denies abdominal pain, nausea, vomiting,diarrhea or constipation.   Denies dysuria, frequency, hesitancy or incontinence. Denies joint pain, swelling and limitation in mobility. Denies headaches, seizures, numbness, or tingling. Denies depression, anxiety or insomnia. Denies skin break down or rash.   PE  BP (!) 160/90   Pulse 72   Resp 14   Ht 5' (1.524 m)   Wt 120 lb (54.4 kg)   SpO2 98%   BMI 23.44 kg/m    Patient alert and oriented and in no cardiopulmonary distress.  HEENT: No facial asymmetry, EOMI,     Neck supple .  Chest: Clear to auscultation bilaterally.  CVS: S1, S2 no murmurs, no S3.Regular rate.  ABD: Soft non tender.   Ext: No edema  MS: Adequate ROM spine, shoulders, hips and knees.  Skin: Intact, no ulcerations or rash noted.  Psych: Good eye contact, normal affect. Memory intact not anxious or depressed appearing.  CNS: CN 2-12 intact, power,  normal throughout.no focal deficits noted.   Assessment &  Plan  DM (diabetes mellitus), type 2, uncontrolled Improved, increase lantus to 25 units DASH diet and commitment to daily physical activity for a minimum of 30 minutes discussed and encouraged, as a part of hypertension management. The importance of attaining a healthy weight is also discussed.  BP/Weight 07/28/2020 07/18/2020 07/11/2020 04/18/2020 04/17/2020 04/02/2020 01/05/2020  Systolic BP - 134 136 181 159 - 186  Diastolic BP - 86 86 100 91 - 116  Wt. (Lbs) 116 - 118.4 121 121.04 118 116  BMI 22.65 - 23.12 22.86 22.87 23.05 22.65  Some encounter information is confidential and restricted. Go to Review Flowsheets activity to see all data.       Essential hypertension Uncontrolled , has not taken med today, re eval next visit DASH diet and commitment to daily physical activity for a minimum of 30 minutes discussed and encouraged, as a part of hypertension management. The importance of attaining a healthy weight is also discussed.  BP/Weight 07/28/2020 07/18/2020 07/11/2020 04/18/2020 04/17/2020 04/02/2020 01/05/2020  Systolic BP - 134 136 181 159 - 186  Diastolic BP - 86 86 100 91 - 116  Wt. (Lbs) 116 - 118.4 121 121.04 118 116  BMI 22.65 - 23.12 22.86 22.87 23.05 22.65  Some encounter information is confidential and restricted. Go to Review Flowsheets activity to see all data.       Vulvovaginitis Re test with self collected swab, was recently positive for trichomonas  Hyperlipidemia LDL goal <100 Hyperlipidemia:Low fat diet discussed and encouraged.  Not at goal needs to lowe  fat intake  Updated lab needed at/ before next visit.   Lipid Panel  Lab Results  Component Value Date   CHOL 203 (H) 07/01/2020   HDL 37 (L) 07/01/2020   LDLCALC 139 (H) 07/01/2020   TRIG 149 07/01/2020   CHOLHDL 5.5 (H) 07/01/2020       High risk sexual behavior Counseled re needs to change behavior, limit activity to one partner and use condoms

## 2020-08-27 LAB — CERVICOVAGINAL ANCILLARY ONLY
Comment: NEGATIVE
Trichomonas: NEGATIVE

## 2020-09-18 ENCOUNTER — Inpatient Hospital Stay (HOSPITAL_COMMUNITY): Admission: RE | Admit: 2020-09-18 | Payer: BC Managed Care – PPO | Source: Ambulatory Visit

## 2020-10-08 ENCOUNTER — Encounter: Payer: BC Managed Care – PPO | Admitting: Family Medicine

## 2020-10-14 ENCOUNTER — Telehealth: Payer: BC Managed Care – PPO | Admitting: Family Medicine

## 2020-10-21 ENCOUNTER — Ambulatory Visit
Admission: EM | Admit: 2020-10-21 | Discharge: 2020-10-21 | Disposition: A | Discharge status: Home / Self Care | Payer: BC Managed Care – PPO | Attending: Family Medicine | Admitting: Family Medicine

## 2020-10-21 ENCOUNTER — Encounter: Payer: Self-pay | Admitting: Emergency Medicine

## 2020-10-21 DIAGNOSIS — N898 Other specified noninflammatory disorders of vagina: Secondary | ICD-10-CM | POA: Insufficient documentation

## 2020-10-21 LAB — POCT URINALYSIS DIP (MANUAL ENTRY)
Bilirubin, UA: NEGATIVE
Blood, UA: NEGATIVE
Glucose, UA: 250 mg/dL — AB
Ketones, POC UA: NEGATIVE mg/dL
Leukocytes, UA: NEGATIVE
Nitrite, UA: NEGATIVE
Protein Ur, POC: NEGATIVE mg/dL
Spec Grav, UA: 1.01 (ref 1.010–1.025)
Urobilinogen, UA: NEGATIVE E.U./dL — AB
pH, UA: 6 (ref 5.0–8.0)

## 2020-10-21 LAB — POCT URINE PREGNANCY: Preg Test, Ur: NEGATIVE

## 2020-10-21 MED ORDER — METRONIDAZOLE 500 MG PO TABS: 500.0000 mg | ORAL_TABLET | Freq: Two times a day (BID) | ORAL | 0 refills | Status: DC

## 2020-10-21 MED ORDER — FLUCONAZOLE 150 MG PO TABS: ORAL_TABLET | ORAL | 0 refills | Status: DC

## 2020-10-21 NOTE — Discharge Instructions (Addendum)
We have sent testing for various causes of vaginal infections. We will notify you of any positive results once they are received. If required, we will prescribe any medications you might need.   

## 2020-10-21 NOTE — ED Triage Notes (Signed)
Milky vaginal discharge, itching and burning with urination x 2 months

## 2020-10-21 NOTE — ED Provider Notes (Signed)
[facility]   [id number] 10/21/20 Arrival Time: 1808  ASSESSMENT & PLAN:  1. Vaginal discharge   2. Vaginal itching    Begin: Meds ordered this encounter  Medications   metroNIDAZOLE (FLAGYL) 500 MG tablet    Sig: Take 1 tablet (500 mg total) by mouth 2 (two) times daily.    Dispense:  14 tablet    Refill:  0   fluconazole (DIFLUCAN) 150 MG tablet    Sig: Take one tablet by mouth as a single dose. May repeat in 3 days if symptoms persist.    Dispense:  2 tablet    Refill:  0      Discharge Instructions      We have sent testing for various causes of vaginal infections. We will notify you of any positive results once they are received. If required, we will prescribe any medications you might need.     Without s/s of PID.  Labs Reviewed  POCT URINALYSIS DIP (MANUAL ENTRY) - Abnormal; Notable for the following components:      Result Value   Glucose, UA =250 (*)    Urobilinogen, UA negative (*)    All other components within normal limits  POCT URINE PREGNANCY  CERVICOVAGINAL ANCILLARY ONLY   Will notify of any positive results. Instructed to refrain from sexual activity for at least seven days.  Reviewed expectations re: course of current medical issues. Questions answered. Outlined signs and symptoms indicating need for more acute intervention. Patient verbalized understanding. After Visit Summary given.   SUBJECTIVE:  [name] Fernandez is a 45 y.o. female who presents with complaint of vaginal discharge/itching. "Feels like BV". Onset gradual. First noticed  1-2 mo ago; off/on; currently sev days . Describes discharge as thin and clear; without odor. No specific aggravating or alleviating factors reported. Denies: urinary frequency, dysuria, and gross hematuria. Afebrile. No abdominal or pelvic pain. Normal PO intake wihout n/v. No genital rashes or lesions. Reports that she is sexually active with single female partner. OTC treatment:  none.   OBJECTIVE:  Vitals:   10/21/20 1832  BP: 129/78  Pulse: 74  Resp: 19  Temp: 98.7 F (37.1 C)  TempSrc: Oral  SpO2: 98%     General appearance: alert, cooperative, appears stated age and no distress Lungs: unlabored respirations; speaks full sentences without difficulty Back: no CVA tenderness; FROM at waist Abdomen: soft, non-tender GU: deferred Skin: warm and dry Psychological: alert and cooperative; normal mood and affect.  Results for orders placed or performed during the hospital encounter of 10/21/20  POCT urinalysis dipstick  Result Value Ref Range   Color, UA yellow yellow   Clarity, UA clear clear   Glucose, UA =250 (A) negative mg/dL   Bilirubin, UA negative negative   Ketones, POC UA negative negative mg/dL   Spec Grav, UA 6.195 0.932 - 1.025   Blood, UA negative negative   pH, UA 6.0 5.0 - 8.0   Protein Ur, POC negative negative mg/dL   Urobilinogen, UA negative (A) 0.2 or 1.0 E.U./dL   Nitrite, UA Negative Negative   Leukocytes, UA Negative Negative  POCT urine pregnancy  Result Value Ref Range   Preg Test, Ur Negative Negative    Labs Reviewed  POCT URINALYSIS DIP (MANUAL ENTRY) - Abnormal; Notable for the following components:      Result Value   Glucose, UA =250 (*)    Urobilinogen, UA negative (*)    All other components within normal limits  POCT URINE  PREGNANCY  CERVICOVAGINAL ANCILLARY ONLY    Allergies  Allergen Reactions   Janumet [Sitagliptin-Metformin Hcl] Nausea And Vomiting    Past Medical History:  Diagnosis Date   Abnormal bleeding in menstrual cycle 06/05/2016   Anxiety    Arthritis    Chlamydia infection 03/15/2016   Chronic hypertension affecting pregnancy 10/11/2018   Depression    Depression    Phreesia 04/02/2020   Diabetes mellitus    Diabetes mellitus without complication (HCC)    Phreesia 04/02/2020   GERD (gastroesophageal reflux disease)    Headache(784.0)    Hyperlipidemia    Hypertension     Multigravida of advanced maternal age in first trimester 10/11/2018   Family History  Problem Relation Age of Onset   Depression Mother    Alcohol abuse Mother    Depression Brother    Sickle cell anemia Brother    Hypertension Brother    Diabetes Father    COPD Father    Anxiety disorder Maternal Aunt    Social History   Socioeconomic History   Marital status: Divorced    Spouse name: Not on file   Number of children: 3   Years of education: Not on file   Highest education level: Not on file  Occupational History   Not on file  Tobacco Use   Smoking status: Never   Smokeless tobacco: Never  Substance and Sexual Activity   Alcohol use: Not Currently    Alcohol/week: 0.0 standard drinks    Comment: occ. wine a glass   Drug use: No   Sexual activity: Yes    Birth control/protection: None  Other Topics Concern   Not on file  Social History Narrative   Not on file   Social Determinants of Health   Financial Resource Strain: Not on file  Food Insecurity: Not on file  Transportation Needs: Not on file  Physical Activity: Not on file  Stress: Not on file  Social Connections: Not on file  Intimate Partner Violence: Not on file           [place], MD 10/22/20 1119

## 2020-10-22 LAB — CERVICOVAGINAL ANCILLARY ONLY
Bacterial Vaginitis (gardnerella): POSITIVE — AB
Candida Glabrata: NEGATIVE
Candida Vaginitis: NEGATIVE
Chlamydia: NEGATIVE
Comment: NEGATIVE
Comment: NEGATIVE
Comment: NEGATIVE
Comment: NEGATIVE
Comment: NEGATIVE
Comment: NORMAL
Neisseria Gonorrhea: NEGATIVE
Trichomonas: NEGATIVE

## 2020-11-06 ENCOUNTER — Encounter: Payer: BC Managed Care – PPO | Admitting: Family Medicine

## 2020-12-03 ENCOUNTER — Ambulatory Visit (INDEPENDENT_AMBULATORY_CARE_PROVIDER_SITE_OTHER): Payer: BC Managed Care – PPO | Admitting: Psychiatry

## 2020-12-03 ENCOUNTER — Encounter (HOSPITAL_COMMUNITY): Payer: Self-pay | Admitting: Psychiatry

## 2020-12-03 ENCOUNTER — Other Ambulatory Visit: Payer: Self-pay

## 2020-12-03 DIAGNOSIS — F330 Major depressive disorder, recurrent, mild: Secondary | ICD-10-CM | POA: Diagnosis not present
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
## 2020-12-17 ENCOUNTER — Other Ambulatory Visit: Payer: Self-pay

## 2020-12-17 ENCOUNTER — Ambulatory Visit (INDEPENDENT_AMBULATORY_CARE_PROVIDER_SITE_OTHER): Payer: BC Managed Care – PPO | Admitting: Psychiatry

## 2020-12-17 DIAGNOSIS — F330 Major depressive disorder, recurrent, mild: Secondary | ICD-10-CM

## 2020-12-17 NOTE — Progress Notes (Signed)
Virtual Visit via Telephone Note  I connected with Ardeen Garland on 12/17/20 at  1:00 PM EDT by telephone and verified that I am speaking with the correct person using two identifiers.  Location: Patient: work office  Provider: [facility] [place] office    I discussed the limitations, risks, security and privacy concerns of performing an evaluation and management service by telephone and the availability of in person appointments. I also discussed with the patient that there may be a patient responsible charge related to this service. The patient expressed understanding and agreed to proceed.    I provided 42 minutes of non-face-to-face time during this encounter.   Adah Salvage, LCSW    THERAPIST PROGRESS NOTE  Session Time: Wednesday 12/17/2020 1:18 PM - 2:00 PM   Participation Level: Active  Behavioral Response: AlertAnxious  Type of Therapy: Individual Therapy  Treatment Goals addressed: Establish therapeutic alliance, learn and implement cognitive and behavioral strategies to cope with anxiety so that it does not interfere with daily functioning  Interventions: CBT and Supportive  Summary: [name] Fernandez is a 45 y.o. female who is referred for services by PCP Dr. Lodema Hong due to patient experiencing symptoms of anxiety and depression.  She denies any psychiatric hospitalizations.  Patient is a returning patient to this practice that she participated in therapy briefly in 2017.  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  Patient reports panic attacks, anxiety, feeling down, being irritable, sleeping excessively and decreased appetite.    Patient last was seen via virtual visit about 2 weeks ago for the assessment appointment.  She reports little to no change in symptoms.  She poor concentration, fatigue,  decrease in appetite, panic attacks, irritability, and feeling down.  She continues to have multiple stressors including adjusting to her divorce in 2018, managing household responsibilities, financial issues, and taking care of her 27 year old daughter who has autism.  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  Per patient's report, her agency is short staffed and patient has had to take on additional responsibilities. She is hopeful that some of this will be alleviated as she is interviewing a candidate for one of the vacant positions tomorrow.  Patient last saw psychiatrist Dr. Vanetta Shawl in November 2021.  Per patient's report, she discontinued taking Prozac as the medicine caused her to crash at the end of the day.  Per patient report, this was problematic due to her parenting household responsibilities at work.  She is planning to schedule a medication evaluation appointment with psychiatrist Dr. Vanetta Shawl.   Suicidal/Homicidal: Nowithout intent/plan  Therapist Response: Reviewed symptoms, discussed stressors, facilitated expression of thoughts and feelings, validated feelings, courage patient to follow through with plans to schedule an appointment with psychiatrist Dr. Vanetta Shawl, discussed the stress response, discussed rationale for using deep breathing to trigger relaxation response, develop plan with patient to practice deep breathing 5 to 10 minutes 2 times per day, will send patient handout in the mail, assisted patient began to examine expectations of self  Plan: Return again in 2  Diagnosis: Axis I: MDD, Recurrent, Mild       Adah Salvage, LCSW 12/17/2020

## 2020-12-26 ENCOUNTER — Ambulatory Visit (INDEPENDENT_AMBULATORY_CARE_PROVIDER_SITE_OTHER): Payer: BC Managed Care – PPO | Admitting: Family Medicine

## 2020-12-26 ENCOUNTER — Encounter: Payer: Self-pay | Admitting: Family Medicine

## 2020-12-26 ENCOUNTER — Other Ambulatory Visit (HOSPITAL_COMMUNITY)
Admission: RE | Admit: 2020-12-26 | Discharge: 2020-12-26 | Disposition: A | Discharge status: Home / Self Care | Payer: BC Managed Care – PPO | Source: Ambulatory Visit | Attending: Family Medicine | Admitting: Family Medicine

## 2020-12-26 ENCOUNTER — Other Ambulatory Visit: Payer: Self-pay

## 2020-12-26 VITALS — BP 192/97 | HR 66 | Temp 98.8°F | Resp 18 | Ht 60.0 in | Wt 125.0 lb

## 2020-12-26 DIAGNOSIS — F322 Major depressive disorder, single episode, severe without psychotic features: Secondary | ICD-10-CM

## 2020-12-26 DIAGNOSIS — Z0001 Encounter for general adult medical examination with abnormal findings: Secondary | ICD-10-CM

## 2020-12-26 DIAGNOSIS — N760 Acute vaginitis: Secondary | ICD-10-CM | POA: Diagnosis not present

## 2020-12-26 DIAGNOSIS — I10 Essential (primary) hypertension: Secondary | ICD-10-CM | POA: Diagnosis not present

## 2020-12-26 DIAGNOSIS — Z1231 Encounter for screening mammogram for malignant neoplasm of breast: Secondary | ICD-10-CM

## 2020-12-26 DIAGNOSIS — E1165 Type 2 diabetes mellitus with hyperglycemia: Secondary | ICD-10-CM

## 2020-12-26 DIAGNOSIS — F411 Generalized anxiety disorder: Secondary | ICD-10-CM

## 2020-12-26 DIAGNOSIS — Z124 Encounter for screening for malignant neoplasm of cervix: Secondary | ICD-10-CM | POA: Insufficient documentation

## 2020-12-26 DIAGNOSIS — Z0000 Encounter for general adult medical examination without abnormal findings: Secondary | ICD-10-CM

## 2020-12-26 DIAGNOSIS — E785 Hyperlipidemia, unspecified: Secondary | ICD-10-CM

## 2020-12-26 MED ORDER — BUSPIRONE HCL 5 MG PO TABS
5.0000 mg | ORAL_TABLET | Freq: Three times a day (TID) | ORAL | 2 refills | Status: DC
Start: 2020-12-26 — End: 2021-08-18

## 2020-12-26 MED ORDER — AMLODIPINE BESYLATE 10 MG PO TABS: 10.0000 mg | ORAL_TABLET | Freq: Every day | ORAL | 1 refills | Status: DC

## 2020-12-26 MED ORDER — FLUOXETINE HCL 20 MG PO TABS: 20.0000 mg | ORAL_TABLET | Freq: Every day | ORAL | 3 refills | Status: DC

## 2020-12-26 MED ORDER — FLUCONAZOLE 150 MG PO TABS: 150.0000 mg | ORAL_TABLET | Freq: Once | ORAL | 0 refills | Status: AC

## 2020-12-26 NOTE — Patient Instructions (Addendum)
F/U in 6 weeks re evaluate blood pressure and flu vaccine , call if you need me before  Please reschedule mammogram at checkout   New higher dose of amlodipine 10 mg once daily  New for depression and anxiety are fluoxetine 20 mg and buspar 7.5 mg  You are referred to Psychiatry  Fasting lipid, cmp and eGFr and HBa1C next week, asap  Please get your covid booster at the pharmacy, overdue  It is important that you exercise regularly at least 30 minutes 5 times a week. If you develop chest pain, have severe difficulty breathing, or feel very tired, stop exercising immediately and seek medical attention  Think about what you will eat, plan ahead. Choose " clean, green, fresh or frozen" over canned, processed or packaged foods which are more sugary, salty and fatty. 70 to 75% of food eaten should be vegetables and fruit. Three meals at set times with snacks allowed between meals, but they must be fruit or vegetables. Aim to eat over a 12 hour period , example 7 am to 7 pm, and STOP after  your last meal of the day. Drink water,generally about 64 ounces per day, no other drink is as healthy. Fruit juice is best enjoyed in a healthy way, by EATING the fruit. Thanks for choosing [facility], we consider it a privelige to serve you.

## 2020-12-28 ENCOUNTER — Encounter: Payer: Self-pay | Admitting: Family Medicine

## 2020-12-28 NOTE — Assessment & Plan Note (Signed)
Uncontrolled resume buspar and refer to therapy

## 2020-12-28 NOTE — Assessment & Plan Note (Signed)
Specimens sent for STD testing 

## 2020-12-28 NOTE — Assessment & Plan Note (Signed)
Uncontrolled , medication adjustd , re eval in 4 to 6 weeks DASH diet and commitment to daily physical activity for a minimum of 30 minutes discussed and encouraged, as a part of hypertension management. The importance of attaining a healthy weight is also discussed.  BP/Weight 12/26/2020 10/21/2020 08/25/2020 07/28/2020 07/18/2020 07/11/2020 04/18/2020  Systolic BP 192 129 160 - 134 505 181  Diastolic BP 97 78 90 - 86 86 100  Wt. (Lbs) 125 - 120 116 - 118.4 121  BMI 24.41 - 23.44 22.65 - 23.12 22.86  Some encounter information is confidential and restricted. Go to Review Flowsheets activity to see all data.

## 2020-12-28 NOTE — Assessment & Plan Note (Signed)

## 2020-12-28 NOTE — Assessment & Plan Note (Signed)
Uncontrolled Updated lab needed at/ before next visit. [name] Fernandez is reminded of the importance of commitment to daily physical activity for 30 minutes or more, as able and the need to limit carbohydrate intake to 30 to 60 grams per meal to help with blood sugar control.   The need to take medication as prescribed, test blood sugar as directed, and to call between visits if there is a concern that blood sugar is uncontrolled is also discussed.   [name] Fernandez is reminded of the importance of daily foot exam, annual eye examination, and good blood sugar, blood pressure and cholesterol control. Was referred to Endo, did not follow through  Diabetic Labs Latest Ref Rng & Units 08/25/2020 07/01/2020 03/26/2020 10/30/2018 01/16/2018  HbA1c 0.0 - 7.0 % 8.3(A) - 11.0(H) 10.5(H) -  Microalbumin mg/dL - - - 2.2 211.4(H)  Micro/Creat Ratio 0.0 - 30.0 mg/g creat - - - - 178.3(H)  Chol 100 - 199 mg/dL - 155(M) 080(E) 233(K) -  HDL >39 mg/dL - 12(A) 46 44(L) -  Calc LDL 0 - 99 mg/dL - 753(Y) 051(T) 021(R) -  Triglycerides 0 - 149 mg/dL - 173 567(O) 141(C) -  Creatinine 0.57 - 1.00 mg/dL - - 3.01 3.14 -   BP/Weight 12/26/2020 10/21/2020 08/25/2020 07/28/2020 07/18/2020 07/11/2020 04/18/2020  Systolic BP 192 129 160 - 134 388 181  Diastolic BP 97 78 90 - 86 86 100  Wt. (Lbs) 125 - 120 116 - 118.4 121  BMI 24.41 - 23.44 22.65 - 23.12 22.86  Some encounter information is confidential and restricted. Go to Review Flowsheets activity to see all data.   Foot/eye exam completion dates Latest Ref Rng & Units 08/25/2020 08/06/2020  Eye Exam No Retinopathy - No Retinopathy  Foot Form Completion - Done -  Some encounter information is confidential and restricted. Go to Review Flowsheets activity to see all data.

## 2020-12-28 NOTE — Progress Notes (Signed)
[name] Fernandez     MRN: [id number]      DOB: [birth year]  HPI: Patient is in for annual physical exam. C/o uncontrolled anxiety and depression, needs to find new Psych, not suicidal or homicidal Not testing bG Denies polyuria, polydipsia, blurred vision , or hypoglycemic episodes. Needs STD testing  Immunization is reviewed , and  updated if needed.   PE: BP (!) 192/97 (BP Location: Right Arm, Patient Position: Sitting, Cuff Size: Normal)   Pulse 66   Temp 98.8 F (37.1 C)   Resp 18   Ht 5' (1.524 m)   Wt 125 lb (56.7 kg)   SpO2 98%   BMI 24.41 kg/m   Pleasant  female, alert and oriented x 3 Afebrile. HEENT No facial trauma or asymetry. Sinuses non tender.  Extra occullar muscles intact.. External ears normal, . Neck: supple, no adenopathy,JVD or thyromegaly.No bruits.  Chest: Clear to ascultation bilaterally.No crackles or wheezes. Non tender to palpation  Breast: No asymetry,no masses or lumps. No tenderness. No nipple discharge or inversion. No axillary or supraclavicular adenopathy  Cardiovascular system; Heart sounds normal,  S1 and  S2 ,no S3.  No murmur, or thrill. Apical beat not displaced Peripheral pulses normal.  Abdomen: Soft, non tender, no organomegaly or masses. No bruits. Bowel sounds normal. No guarding, tenderness or rebound.   GU: External genitalia normal female genitalia , normal female distribution of hair. No lesions. Urethral meatus normal in size, no  Prolapse, no lesions visibly  Present. Bladder non tender. Vagina pink and moist , with no visible lesions , discharge present . Adequate pelvic support no  cystocele or rectocele noted Cervix pink a, no lesions or ulcerations noted,  discharge noted from os Uterus normal size, no adnexal masses, no cervical motion or adnexal tenderness.   Musculoskeletal exam: Full ROM of spine, hips , shoulders and knees. No deformity ,swelling or crepitus noted. No muscle wasting or  atrophy.   Neurologic: Cranial nerves 2 to 12 intact. Power, tone ,sensation and reflexes normal throughout. No disturbance in gait. No tremor.  Skin: Intact, no ulceration, erythema , scaling or rash noted. Pigmentation normal throughout  Psych; Anxious  mood a.  concentration normal   Assessment & Plan:  Annual physical exam Annual exam as documented. Counseling done  re healthy lifestyle involving commitment to 150 minutes exercise per week, heart healthy diet, and attaining healthy weight.The importance of adequate sleep also discussed. Regular seat belt use and home safety, is also discussed. Changes in health habits are decided on by the patient with goals and time frames  set for achieving them. Immunization and cancer screening needs are specifically addressed at this visit.   Depression, major, single episode, severe (HCC) Start fluoxetine 20 mg daily and refer to therapy  GAD (generalized anxiety disorder) Uncontrolled resume buspar and refer to therapy  Essential hypertension Uncontrolled , medication adjustd , re eval in 4 to 6 weeks DASH diet and commitment to daily physical activity for a minimum of 30 minutes discussed and encouraged, as a part of hypertension management. The importance of attaining a healthy weight is also discussed.  BP/Weight 12/26/2020 10/21/2020 08/25/2020 07/28/2020 07/18/2020 07/11/2020 04/18/2020  Systolic BP 192 129 160 - 134 782 181  Diastolic BP 97 78 90 - 86 86 100  Wt. (Lbs) 125 - 120 116 - 118.4 121  BMI 24.41 - 23.44 22.65 - 23.12 22.86  Some encounter information is confidential and restricted. Go to Review Flowsheets activity to  see all data.       DM (diabetes mellitus), type 2, uncontrolled (HCC) Uncontrolled Updated lab needed at/ before next visit. [name] Fernandez is reminded of the importance of commitment to daily physical activity for 30 minutes or more, as able and the need to limit carbohydrate intake to 30 to 60 grams  per meal to help with blood sugar control.   The need to take medication as prescribed, test blood sugar as directed, and to call between visits if there is a concern that blood sugar is uncontrolled is also discussed.   [name] Fernandez is reminded of the importance of daily foot exam, annual eye examination, and good blood sugar, blood pressure and cholesterol control. Was referred to Endo, did not follow through  Diabetic Labs Latest Ref Rng & Units 08/25/2020 07/01/2020 03/26/2020 10/30/2018 01/16/2018  HbA1c 0.0 - 7.0 % 8.3(A) - 11.0(H) 10.5(H) -  Microalbumin mg/dL - - - 2.2 382.4(H)  Micro/Creat Ratio 0.0 - 30.0 mg/g creat - - - - 178.3(H)  Chol 100 - 199 mg/dL - 505(L) 976(B) 341(P) -  HDL >39 mg/dL - 37(T) 46 02(I) -  Calc LDL 0 - 99 mg/dL - 097(D) 532(D) 924(Q) -  Triglycerides 0 - 149 mg/dL - 683 419(Q) 222(L) -  Creatinine 0.57 - 1.00 mg/dL - - 7.98 9.21 -   BP/Weight 12/26/2020 10/21/2020 08/25/2020 07/28/2020 07/18/2020 07/11/2020 04/18/2020  Systolic BP 192 129 160 - 134 194 181  Diastolic BP 97 78 90 - 86 86 100  Wt. (Lbs) 125 - 120 116 - 118.4 121  BMI 24.41 - 23.44 22.65 - 23.12 22.86  Some encounter information is confidential and restricted. Go to Review Flowsheets activity to see all data.   Foot/eye exam completion dates Latest Ref Rng & Units 08/25/2020 08/06/2020  Eye Exam No Retinopathy - No Retinopathy  Foot Form Completion - Done -  Some encounter information is confidential and restricted. Go to Review Flowsheets activity to see all data.        Vulvovaginitis Specimens sent for STD testing

## 2020-12-28 NOTE — Assessment & Plan Note (Signed)
Start fluoxetine 20 mg daily and refer to therapy 

## 2020-12-30 LAB — CERVICOVAGINAL ANCILLARY ONLY
Bacterial Vaginitis (gardnerella): NEGATIVE
Candida Glabrata: NEGATIVE
Candida Vaginitis: NEGATIVE
Chlamydia: NEGATIVE
Comment: NEGATIVE
Comment: NEGATIVE
Comment: NEGATIVE
Comment: NEGATIVE
Comment: NEGATIVE
Comment: NORMAL
Neisseria Gonorrhea: NEGATIVE
Trichomonas: NEGATIVE

## 2020-12-31 ENCOUNTER — Other Ambulatory Visit: Payer: Self-pay

## 2020-12-31 ENCOUNTER — Ambulatory Visit (INDEPENDENT_AMBULATORY_CARE_PROVIDER_SITE_OTHER): Payer: BC Managed Care – PPO | Admitting: Psychiatry

## 2020-12-31 DIAGNOSIS — F330 Major depressive disorder, recurrent, mild: Secondary | ICD-10-CM | POA: Diagnosis not present

## 2020-12-31 DIAGNOSIS — F411 Generalized anxiety disorder: Secondary | ICD-10-CM | POA: Diagnosis not present

## 2020-12-31 NOTE — Progress Notes (Signed)
Virtual Visit via Video Note  I connected with [name] Fernandez on 12/31/20 at  1:00 PM EDT by a video enabled telemedicine application and verified that I am speaking with the correct person using two identifiers.  Location: Patient: Home Provider: [facility] [place] office    I discussed the limitations of evaluation and management by telemedicine and the availability of in person appointments. The patient expressed understanding and agreed to proceed.  I provided 29 minutes of non-face-to-face time during this encounter.  Adah Salvage, LCSW   THERAPIST PROGRESS NOTE  Session Time: Wednesday 12/31/2020 1:30 PM - 1:59 PM   Participation Level: Active  Behavioral Response: AlertAnxious  Type of Therapy: Individual Therapy  Treatment Goals addressed: Reduce overall level, frequency, intensity of the anxiety so that daily functioning is not impaired AEB decreasing panic episodes to 1 time per week for 3 consecutive weeks  Interventions: CBT and Supportive  Summary: [name] Fernandez is a 45 y.o. female who is referred for services by PCP Dr. Lodema Hong due to patient experiencing symptoms of anxiety and depression.  She denies any psychiatric hospitalizations.  Patient is a returning patient to this practice that she participated in therapy briefly in 2017.  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  Patient reports panic attacks, anxiety, feeling down, being irritable, sleeping excessively and decreased appetite.    Patient last was seen via virtual visit about 2 weeks ago.  She reports continued symptoms of anxiety and depression including panic attacks, poor concentration, irritability, sleeping excessively, and feeling down.  However she reports anxiety seems to be more problematic.  She continues to report multiple  stressors including her job, adjusting to her divorce in 2018, managing household responsibilities, financial issues, and taking care of her 84 year old daughter who has autism.  She reports recently seeing PCP who prescribed patient BuSpar 5 mg 3 times per day.  Patient reports beginning this dosage yesterday and says that she continues to take Prozac.  She reports this medication regimen seems to be helping as she reports not being as overwhelmed with anxiety provoking situations yesterday as she has been in the past.  Patient is being referred by PCP to a psychiatrist for future medication management per patient's report. She reports experiencing some stress relief after expressing her job concerns to nurse management who exhibited understanding and support.  Patient is hopeful about receiving staff to help with some of her job duties.  She reports that she has been using deep breathing as an intervention try to cope with panic attacks   Suicidal/Homicidal: Nowithout intent/plan  Therapist Response: Reviewed symptoms, discussed stressors, facilitated expression of thoughts and feelings, validated feelings, praised and reinforced patient's efforts to communicate concerns to nursing management, discussed effects, praised and reinforced patient's efforts to use deep breathing as an intervention, discussed effects, reviewed rationale for practicing deep breathing regularly, developed plan with patient to practice deep breathing 5 to 10 minutes twice per day, developed treatment plan, obtained patient's verbal consent to initial plan for patient as this was a virtual visit,   Plan: Return again in 2 weeks  Diagnosis: Axis I: MDD, Recurrent, Mild    Generalized Anxiety Disorder        Adah Salvage, LCSW 12/31/2020

## 2021-01-02 LAB — CYTOLOGY - PAP
Comment: NEGATIVE
Comment: NEGATIVE
HPV 16: NEGATIVE
HPV 18 / 45: NEGATIVE
High risk HPV: POSITIVE — AB

## 2021-01-09 ENCOUNTER — Other Ambulatory Visit: Payer: Self-pay

## 2021-01-09 ENCOUNTER — Ambulatory Visit (HOSPITAL_COMMUNITY)
Admission: RE | Admit: 2021-01-09 | Discharge: 2021-01-09 | Disposition: A | Discharge status: Home / Self Care | Payer: BC Managed Care – PPO | Source: Ambulatory Visit | Attending: Family Medicine | Admitting: Family Medicine

## 2021-01-09 ENCOUNTER — Encounter (HOSPITAL_COMMUNITY): Payer: Self-pay

## 2021-01-09 DIAGNOSIS — Z1231 Encounter for screening mammogram for malignant neoplasm of breast: Secondary | ICD-10-CM

## 2021-01-14 ENCOUNTER — Other Ambulatory Visit: Payer: Self-pay

## 2021-01-14 ENCOUNTER — Telehealth (HOSPITAL_COMMUNITY): Payer: Self-pay | Admitting: Psychiatry

## 2021-01-14 ENCOUNTER — Ambulatory Visit (HOSPITAL_COMMUNITY): Payer: BC Managed Care – PPO | Admitting: Psychiatry

## 2021-01-14 NOTE — Telephone Encounter (Signed)
Therapist attempted to contact patient twice via text through caregility platform for scheduled appointment, no response.  Therapist left message indicating attempt and requesting patient call office. 

## 2021-01-30 ENCOUNTER — Encounter: Payer: Self-pay | Admitting: Family Medicine

## 2021-02-06 ENCOUNTER — Ambulatory Visit: Payer: BC Managed Care – PPO | Admitting: Family Medicine

## 2021-02-06 ENCOUNTER — Encounter: Payer: Self-pay | Admitting: Family Medicine

## 2021-02-06 ENCOUNTER — Other Ambulatory Visit: Payer: Self-pay

## 2021-02-06 VITALS — BP 200/100 | Wt 121.0 lb

## 2021-02-06 DIAGNOSIS — N760 Acute vaginitis: Secondary | ICD-10-CM

## 2021-02-06 DIAGNOSIS — E1165 Type 2 diabetes mellitus with hyperglycemia: Secondary | ICD-10-CM

## 2021-02-06 DIAGNOSIS — B9689 Other specified bacterial agents as the cause of diseases classified elsewhere: Secondary | ICD-10-CM

## 2021-02-06 DIAGNOSIS — R87810 Cervical high risk human papillomavirus (HPV) DNA test positive: Secondary | ICD-10-CM | POA: Diagnosis not present

## 2021-02-06 DIAGNOSIS — R87612 Low grade squamous intraepithelial lesion on cytologic smear of cervix (LGSIL): Secondary | ICD-10-CM

## 2021-02-06 DIAGNOSIS — Z794 Long term (current) use of insulin: Secondary | ICD-10-CM

## 2021-02-06 DIAGNOSIS — F322 Major depressive disorder, single episode, severe without psychotic features: Secondary | ICD-10-CM

## 2021-02-06 DIAGNOSIS — I10 Essential (primary) hypertension: Secondary | ICD-10-CM

## 2021-02-06 DIAGNOSIS — E785 Hyperlipidemia, unspecified: Secondary | ICD-10-CM

## 2021-02-06 DIAGNOSIS — F411 Generalized anxiety disorder: Secondary | ICD-10-CM

## 2021-02-06 MED ORDER — INSULIN GLARGINE 100 UNIT/ML ~~LOC~~ SOLN: SUBCUTANEOUS | 11 refills | Status: DC

## 2021-02-06 MED ORDER — METRONIDAZOLE 500 MG PO TABS
500.0000 mg | ORAL_TABLET | Freq: Two times a day (BID) | ORAL | 0 refills | Status: DC
Start: 2021-02-06 — End: 2021-08-18

## 2021-02-06 MED ORDER — METRONIDAZOLE 0.75 % VA GEL: VAGINAL | 4 refills | Status: DC

## 2021-02-06 NOTE — Assessment & Plan Note (Addendum)
Non compliant with med, states she will change this, stays stressed, neds I office eval in next 1 to 2 weeks DASH diet and commitment to daily physical activity for a minimum of 30 minutes discussed and encouraged, as a part of hypertension management. The importance of attaining a healthy weight is also discussed.  BP/Weight 02/06/2021 12/26/2020 10/21/2020 08/25/2020 07/28/2020 07/18/2020 07/11/2020  Systolic BP 200 192 129 160 - 518 136  Diastolic BP 100 97 78 90 - 86 86  Wt. (Lbs) 121 125 - 120 116 - 118.4  BMI 23.63 24.41 - 23.44 22.65 - 23.12  Some encounter information is confidential and restricted. Go to Review Flowsheets activity to see all data.  encouraged work excuse for next several days says unable

## 2021-02-06 NOTE — Assessment & Plan Note (Signed)
Reports recurrent infections, can't seem to clear will rx gel and tabs

## 2021-02-06 NOTE — Progress Notes (Signed)
Virtual Visit via Telephone Note  I connected with [name] Fernandez on 02/06/21 at  2:00 PM EDT by telephone and verified that I am speaking with the correct person using two identifiers.  Location: Patient: work Provider: office   I discussed the limitations, risks, security and privacy concerns of performing an evaluation and management service by telephone and the availability of in person appointments. I also discussed with the patient that there may be a patient responsible charge related to this service. The patient expressed understanding and agreed to proceed.   History of Present Illness: F/u uncontrolled diabetes and hypertension, not taking meds as prescribed and reports recent high BP, states unable to take time off work though I recommend on medical grounds Review abnormal pap and refer to gyne for follow up Uncontrolled depression and anxiety, needs to re connect with therapist C/o recurrent/ persistent BV   Observations/Objective: BP (!) 200/100   Wt 121 lb (54.9 kg)   BMI 23.63 kg/m  Good communication with no confusion and intact memory. Alert and oriented x 3 No signs of respiratory distress during speech, anxious , overwhelmed   Assessment and Plan: LGSIL on Pap smear of cervix Discussed with pt, refer gyne   Cervical high risk HPV (human papillomavirus) test positive Discussed with pt , refer gyne, advised condom use  Essential hypertension Non compliant with med, states she will change this, stays stressed, neds I office eval in next 1 to 2 weeks DASH diet and commitment to daily physical activity for a minimum of 30 minutes discussed and encouraged, as a part of hypertension management. The importance of attaining a healthy weight is also discussed.  BP/Weight 02/06/2021 12/26/2020 10/21/2020 08/25/2020 07/28/2020 07/18/2020 07/11/2020  Systolic BP 200 192 129 160 - 485 136  Diastolic BP 100 97 78 90 - 86 86  Wt. (Lbs) 121 125 - 120 116 - 118.4  BMI 23.63  24.41 - 23.44 22.65 - 23.12  Some encounter information is confidential and restricted. Go to Review Flowsheets activity to see all data.  encouraged work excuse for next several days says unable    Type 2 diabetes mellitus with hyperglycemia (HCC) Non compliant with med and not testing regularly, will change to visl as requested , stressed importance of self care , needs time out of work, states unable [name] Fernandez is reminded of the importance of commitment to daily physical activity for 30 minutes or more, as able and the need to limit carbohydrate intake to 30 to 60 grams per meal to help with blood sugar control.   The need to take medication as prescribed, test blood sugar as directed, and to call between visits if there is a concern that blood sugar is uncontrolled is also discussed.  Updated lab overdue.   [name] Fernandez is reminded of the importance of daily foot exam, annual eye examination, and good blood sugar, blood pressure and cholesterol control.  Diabetic Labs Latest Ref Rng & Units 08/25/2020 07/01/2020 03/26/2020 10/30/2018 01/16/2018  HbA1c 0.0 - 7.0 % 8.3(A) - 11.0(H) 10.5(H) -  Microalbumin mg/dL - - - 2.2 462.4(H)  Micro/Creat Ratio 0.0 - 30.0 mg/g creat - - - - 178.3(H)  Chol 100 - 199 mg/dL - 703(J) 009(F) 818(E) -  HDL >39 mg/dL - 99(B) 46 71(I) -  Calc LDL 0 - 99 mg/dL - 967(E) 938(B) 017(P) -  Triglycerides 0 - 149 mg/dL - 102 585(I) 778(E) -  Creatinine 0.57 - 1.00 mg/dL - - 4.23 5.36 -  BP/Weight 02/06/2021 12/26/2020 10/21/2020 08/25/2020 07/28/2020 07/18/2020 07/11/2020  Systolic BP 200 192 129 160 - 960 136  Diastolic BP 100 97 78 90 - 86 86  Wt. (Lbs) 121 125 - 120 116 - 118.4  BMI 23.63 24.41 - 23.44 22.65 - 23.12  Some encounter information is confidential and restricted. Go to Review Flowsheets activity to see all data.   Foot/eye exam completion dates Latest Ref Rng & Units 08/25/2020 08/06/2020  Eye Exam No Retinopathy - No Retinopathy  Foot Form Completion -  Done -  Some encounter information is confidential and restricted. Go to Review Flowsheets activity to see all data.        BV (bacterial vaginosis) Reports recurrent infections, can't seem to clear will rx gel and tabs  Depression, major, single episode, severe (HCC) Not being treated as ptreports medication non compliance, states she will be calling therapist for appointment as soon as she completes the visit, not suicidal or homicidal  GAD (generalized anxiety disorder) Untreated due to medical non compliance, states she will start taking her meds  Hyperlipidemia LDL goal <100 Hyperlipidemia:Low fat diet discussed and encouraged.   Lipid Panel  Lab Results  Component Value Date   CHOL 203 (H) 07/01/2020   HDL 37 (L) 07/01/2020   LDLCALC 139 (H) 07/01/2020   TRIG 149 07/01/2020   CHOLHDL 5.5 (H) 07/01/2020  Updated lab needed at/ before next visit.      Follow Up Instructions:    I discussed the assessment and treatment plan with the patient. The patient was provided an opportunity to ask questions and all were answered. The patient agreed with the plan and demonstrated an understanding of the instructions.   The patient was advised to call back or seek an in-person evaluation if the symptoms worsen or if the condition fails to improve as anticipated.  I provided 35 minutes of non-face-to-face time during this encounter.   Syliva Overman, MD

## 2021-02-06 NOTE — Patient Instructions (Addendum)
F/U in office in 4 to 6 weeks, re evaluate blood pressure, and for flu vaccine, call if you need me sooner  YOU MAY SCHEDULE A FLU VACCINE IN THE OFFICE WITH THE NURSE SOONER IF YOU WISH  You are referred urgently to gyne as discussed re abnormal pap  VERY important that you take ALL medication as prescribed  Metronidazole is prescribed , first take the tablets for 1 week, then the following week , start weekly metrogel  Nurse please prescribe the insulin in a vial dose at 30 units daily  IMPORTANT THAT YOU KEEP APPOINTMENT WITH  THERAPIST, RESCHEDULE AS YOU JUST TOLD ME THAT YOU WOULD  REMEMBER , YOUR HEALTH IS THE MOST IMPORTANT THING THAT YOU HAVE, SO FOR ILLNESSES THAT CAN BE MANAGED AND CONTROLLED IT IS VITAL YOU PUT THE TIME AND ENERGY INTP DOING THAT

## 2021-02-06 NOTE — Assessment & Plan Note (Addendum)
Non compliant with med and not testing regularly, will change to visl as requested , stressed importance of self care , needs time out of work, states unable [name] is reminded of the importance of commitment to daily physical activity for 30 minutes or more, as able and the need to limit carbohydrate intake to 30 to 60 grams per meal to help with blood sugar control.   The need to take medication as prescribed, test blood sugar as directed, and to call between visits if there is a concern that blood sugar is uncontrolled is also discussed.  Updated lab overdue.   [name] is reminded of the importance of daily foot exam, annual eye examination, and good blood sugar, blood pressure and cholesterol control.  Diabetic Labs Latest Ref Rng & Units 08/25/2020 07/01/2020 03/26/2020 10/30/2018 01/16/2018  HbA1c 0.0 - 7.0 % 8.3(A) - 11.0(H) 10.5(H) -  Microalbumin mg/dL - - - 2.2 027.4(H)  Micro/Creat Ratio 0.0 - 30.0 mg/g creat - - - - 178.3(H)  Chol 100 - 199 mg/dL - 741(O) 878(M) 767(M) -  HDL >39 mg/dL - 09(O) 46 70(J) -  Calc LDL 0 - 99 mg/dL - 628(Z) 662(H) 476(L) -  Triglycerides 0 - 149 mg/dL - 465 035(W) 656(C) -  Creatinine 0.57 - 1.00 mg/dL - - 1.27 5.17 -   BP/Weight 02/06/2021 12/26/2020 10/21/2020 08/25/2020 07/28/2020 07/18/2020 07/11/2020  Systolic BP 200 192 129 160 - 001 136  Diastolic BP 100 97 78 90 - 86 86  Wt. (Lbs) 121 125 - 120 116 - 118.4  BMI 23.63 24.41 - 23.44 22.65 - 23.12  Some encounter information is confidential and restricted. Go to Review Flowsheets activity to see all data.   Foot/eye exam completion dates Latest Ref Rng & Units 08/25/2020 08/06/2020  Eye Exam No Retinopathy - No Retinopathy  Foot Form Completion - Done -  Some encounter information is confidential and restricted. Go to Review Flowsheets activity to see all data.

## 2021-02-06 NOTE — Assessment & Plan Note (Signed)
Discussed with pt, refer gyne

## 2021-02-06 NOTE — Assessment & Plan Note (Addendum)
Discussed with pt , refer gyne, advised condom use

## 2021-02-08 ENCOUNTER — Encounter: Payer: Self-pay | Admitting: Family Medicine

## 2021-02-08 NOTE — Assessment & Plan Note (Signed)
Not being treated as ptreports medication non compliance, states she will be calling therapist for appointment as soon as she completes the visit, not suicidal or homicidal

## 2021-02-08 NOTE — Assessment & Plan Note (Signed)
Hyperlipidemia:Low fat diet discussed and encouraged.   Lipid Panel  Lab Results  Component Value Date   CHOL 203 (H) 07/01/2020   HDL 37 (L) 07/01/2020   LDLCALC 139 (H) 07/01/2020   TRIG 149 07/01/2020   CHOLHDL 5.5 (H) 07/01/2020  Updated lab needed at/ before next visit.

## 2021-02-08 NOTE — Assessment & Plan Note (Signed)
Untreated due to medical non compliance, states she will start taking her meds

## 2021-03-10 ENCOUNTER — Ambulatory Visit (INDEPENDENT_AMBULATORY_CARE_PROVIDER_SITE_OTHER): Payer: BC Managed Care – PPO | Admitting: Obstetrics & Gynecology

## 2021-03-10 ENCOUNTER — Other Ambulatory Visit: Payer: Self-pay

## 2021-03-10 ENCOUNTER — Encounter: Payer: Self-pay | Admitting: Obstetrics & Gynecology

## 2021-03-10 ENCOUNTER — Other Ambulatory Visit (HOSPITAL_COMMUNITY)
Admission: RE | Admit: 2021-03-10 | Discharge: 2021-03-10 | Disposition: A | Discharge status: Home / Self Care | Payer: BC Managed Care – PPO | Source: Ambulatory Visit | Attending: Obstetrics & Gynecology | Admitting: Obstetrics & Gynecology

## 2021-03-10 VITALS — BP 120/80 | Ht 60.0 in | Wt 126.0 lb

## 2021-03-10 DIAGNOSIS — R87612 Low grade squamous intraepithelial lesion on cytologic smear of cervix (LGSIL): Secondary | ICD-10-CM | POA: Diagnosis present

## 2021-03-10 NOTE — Progress Notes (Signed)
Referring Provider:  Dr Berenice Primas  HPI:  [name] Fernandez is a 45 y.o.  [id number]  who presents today for evaluation and management of abnormal cervical cytology.    Dysplasia History:  LGSIL recent PAP Last PAP normal  ROS:  Pertinent items are noted in HPI.  OB History  Gravida Para Term Preterm AB Living  5 3     1 3   SAB IAB Ectopic Multiple Live Births  1            # Outcome Date GA Lbr Len/2nd Weight Sex Delivery Anes PTL Lv  5 Gravida           4 SAB           3 Para           2 Para           1 Para             Past Medical History:  Diagnosis Date   Abnormal bleeding in menstrual cycle 06/05/2016   Anxiety    Arthritis    Chlamydia infection 03/15/2016   Chronic hypertension affecting pregnancy 10/11/2018   Depression    Depression    Phreesia 04/02/2020   Diabetes mellitus    Diabetes mellitus without complication (HCC)    Phreesia 04/02/2020   GERD (gastroesophageal reflux disease)    Headache(784.0)    Hyperlipidemia    Hypertension    Multigravida of advanced maternal age in first trimester 10/11/2018    Past Surgical History:  Procedure Laterality Date   BREAST BIOPSY     BREAST SURGERY     right lumpectomy, benign   CHOLECYSTECTOMY N/A 12/27/2013   Procedure: LAPAROSCOPIC CHOLECYSTECTOMY ;  Surgeon: 12/29/2013, MD;  Location: [facility];  Service: General;  Laterality: N/A;    SOCIAL HISTORY:  Social History   Substance and Sexual Activity  Alcohol Use Not Currently   Alcohol/week: 0.0 standard drinks   Comment: occ. wine a glass    Social History   Substance and Sexual Activity  Drug Use No     Family History  Problem Relation Age of Onset   Depression Mother    Alcohol abuse Mother    Depression Brother    Sickle cell anemia Brother    Hypertension Brother    Diabetes Father    COPD Father    Anxiety disorder Maternal Aunt     ALLERGIES:  Janumet [sitagliptin-metformin hcl]  Current Outpatient Medications on File  Prior to Visit  Medication Sig Dispense Refill   amLODipine (NORVASC) 10 MG tablet Take 1 tablet (10 mg total) by mouth daily. 90 tablet 1   busPIRone (BUSPAR) 5 MG tablet Take 1 tablet (5 mg total) by mouth 3 (three) times daily. 90 tablet 2   FLUoxetine (PROZAC) 20 MG tablet Take 1 tablet (20 mg total) by mouth daily. 30 tablet 3   glipiZIDE (GLUCOTROL XL) 10 MG 24 hr tablet Take 1 tablet (10 mg total) by mouth daily with breakfast. 90 tablet 1   lisinopril-hydrochlorothiazide (ZESTORETIC) 10-12.5 MG tablet Take 1 tablet by mouth daily. 90 tablet 1   metroNIDAZOLE (FLAGYL) 500 MG tablet Take 1 tablet (500 mg total) by mouth 2 (two) times daily. 14 tablet 0   metroNIDAZOLE (METROGEL VAGINAL) 0.75 % vaginal gel Insert one applicator full twice  weekly 70 g 4   Multiple Vitamin (MULTIVITAMIN) capsule Take 1 capsule by mouth daily.     rosuvastatin (CRESTOR) 40 MG  tablet Take 1 tablet (40 mg total) by mouth daily. 90 tablet 1   Current Facility-Administered Medications on File Prior to Visit  Medication Dose Route Frequency Provider Last Rate Last Admin   pramoxine-hydrocortisone (PROCTOCREAM-HC) rectal cream   Rectal TID Tilda Burrow, MD        Physical Exam: -Vitals:  BP 120/80   Ht 5' (1.524 m)   Wt 126 lb (57.2 kg)   LMP 03/08/2021   BMI 24.61 kg/m  GEN: WD, WN, NAD.  A+ O x 3, good mood and affect. ABD:  NT, ND.  Soft, no masses.  No hernias noted.   Pelvic:   Vulva: Normal appearance.  No lesions.  Vagina: No lesions or abnormalities noted.  Support: Normal pelvic support.  Urethra No masses tenderness or scarring.  Meatus Normal size without lesions or prolapse.  Cervix: See below.  Anus: Normal exam.  No lesions.  Perineum: Normal exam.  No lesions.        Bimanual   Uterus: Normal size.  Non-tender.  Mobile.  AV.  Adnexae: No masses.  Non-tender to palpation.  Cul-de-sac: Negative for abnormality.   PROCEDURE: 1.  Urine Pregnancy Test:  not done 2.  Colposcopy  performed with 4% acetic acid after verbal consent obtained                                         -Aceto-white Lesions Location(s): none.              -Biopsy performed at 6, 12 o'clock               -ECC indicated and performed: Yes.       -Biopsy sites made hemostatic with pressure, AgNO3, and/or Monsel's solution   -Satisfactory colposcopy: Yes.      -Evidence of Invasive cervical CA :  NO  ASSESSMENT:  [name] Fernandez is a 45 y.o. [id number] here for  1. LGSIL on Pap smear of cervix   .  PLAN: 1.  I discussed the grading system of pap smears and HPV high risk viral types.  We will discuss and base management after colpo results return. 2. Follow up PAP 6 months, vs intervention if high grade dysplasia identified 3. Treatment of persistantly abnormal PAP smears and cervical dysplasia, even mild, is discussed w pt today in detail, as well as the pros and cons of Cryo and LEEP procedures. Will consider and discuss after results.      Annamarie Major, MD, [facility], [facility] 03/10/2021  1:34 PM

## 2021-03-10 NOTE — Patient Instructions (Signed)
Colposcopy, Care After This sheet gives you information about how to care for yourself after your procedure. Your health care provider may also give you more specific instructions. If you have problems or questions, contact your health care provider. What can I expect after the procedure? If you had a colposcopy without a biopsy, you can expect to feel fine right away after your procedure. However, you may have some spotting of blood for a few days. You can return to your normal activities. If you had a colposcopy with a biopsy, it is common after the procedure to have: Soreness and mild pain. These may last for a few days. Light-headedness. Mild vaginal bleeding or discharge that is dark-colored and grainy. This may last for a few days. The discharge may be caused by a liquid (solution) that was used during the procedure. You may need to wear a sanitary pad during this time. Spotting of blood for at least 48 hours after the procedure. Follow these instructions at home: Medicines Take over-the-counter and prescription medicines only as told by your health care provider. Talk with your health care provider about what type of over-the-counter pain medicine and prescription medicine you can start to take again. It is especially important to talk with your health care provider if you take blood thinners. Activity Limit your physical activity for the first day after your procedure as told by your health care provider. Avoid using douche products, using tampons, or having sex for at least 3 days after the procedure or for as long as told. Return to your normal activities as told by your health care provider. Ask your health care provider what activities are safe for you. General instructions  Drink enough fluid to keep your urine pale yellow. Ask your health care provider if you may take baths, swim, or use a hot tub. You may take showers. If you use birth control (contraception), continue to use  it. Keep all follow-up visits as told by your health care provider. This is important. Contact a health care provider if: You develop a skin rash. Get help right away if: You bleed a lot from your vagina or pass blood clots. This includes using more than one sanitary pad each hour for 2 hours in a row. You have a fever or chills. You have vaginal discharge that is abnormal, is yellow in color, or smells bad. This could be a sign of infection. You have severe pain or cramps in your lower abdomen that do not go away with medicine. You faint. Summary If you had a colposcopy without a biopsy, you can expect to feel fine right away, but you may have some spotting of blood for a few days. You can return to your normal activities. If you had a colposcopy with a biopsy, it is common to have mild pain for a few days and spotting for 48 hours after the procedure. Avoid using douche products, using tampons, and having sex for at least 3 days after the procedure or for as long as told by your health care provider. Get help right away if you have heavy bleeding, severe pain, or signs of infection. This information is not intended to replace advice given to you by your health care provider. Make sure you discuss any questions you have with your health care provider. Document Revised: 04/25/2019 Document Reviewed: 04/25/2019 Elsevier Patient Education  2022 [employer]  

## 2021-03-11 ENCOUNTER — Ambulatory Visit (INDEPENDENT_AMBULATORY_CARE_PROVIDER_SITE_OTHER): Payer: BC Managed Care – PPO | Admitting: Psychiatry

## 2021-03-11 DIAGNOSIS — F330 Major depressive disorder, recurrent, mild: Secondary | ICD-10-CM

## 2021-03-11 NOTE — Progress Notes (Signed)
Virtual Visit via Video Note  I connected with [name] Fernandez on 03/11/21 at 4:05 PM EDT by a video enabled telemedicine application and verified that I am speaking with the correct person using two identifiers.  Location: Patient: Home Provider: [facility]    I discussed the limitations of evaluation and management by telemedicine and the availability of in person appointments. The patient expressed understanding and agreed to proceed.   I provided 43 minutes of non-face-to-face time during this encounter.   [name] Salvage, LCSW    THERAPIST PROGRESS NOTE  Session Time: Wednesday 03/11/2021 4:05 PM - 4:48 PM   Participation Level: Active  Behavioral Response: Alert, less anxious  Type of Therapy: Individual Therapy  Treatment Goals addressed: Reduce overall level, frequency, intensity of the anxiety so that daily functioning is not impaired AEB decreasing panic episodes to 1 time per week for 3 consecutive weeks  Interventions: CBT and Supportive  Summary: [name] Fernandez is a 45 y.o. female who is referred for services by PCP Dr. Lodema Hong due to patient experiencing symptoms of anxiety and depression.  She denies any psychiatric hospitalizations.  Patient is a returning patient to this practice that she participated in therapy briefly in 2017.  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  Patient reports panic attacks, anxiety, feeling down, being irritable, sleeping excessively and decreased appetite.    Patient last was seen via virtual visit about 2 months ago.  She reports improved mood and decreased symptoms of anxiety since last session.  She reports she has been inconsistent regarding taking all of her medications including antianxiety medication and diabetic medication, but resumed taking  regularly about 2 weeks ago.  She expresses some frustration with self as she has difficulty being consistent with taking her medication and realizes this has an adverse effect on her functioning.  She reports decreased stress and anxiety regarding her job as she now has some assistance.  She also reports being more organized at work and using a daily list.  Patient also has begun to have more realistic expectations of self regarding work.  She still expresses desire to become more organized regarding taking care of her home.  She continues to work 3 jobs, parent a special needs child, and take care of her household.  She still is having some difficulty finding balance regarding this. Patient reports panic attacks have decreased.  Suicidal/Homicidal: Nowithout intent/plan  Therapist Response: Reviewed symptoms, discussed stressors, facilitated expression of thoughts and feelings, validated feelings, praised and reinforced patient's efforts to have more realistic expectations of self/ use of organizational tools at work, discussed effects, discussed connection between thoughts/mood/and behavior, assisted patient identify strategies to maintain consistency regarding medication compliance, encourage patient to follow through with plan to take medication in the morning just prior to brushing teeth, assisted patient identify ways to begin to organize housekeeping responsibilities at home, assisted patient identify priorities regarding housekeeping responsibilities long with realistic expectations of self regarding this, developed plan with patient to work on organizing one of her bedrooms 1 hour/week until her task is completed,   Plan: Return again in 2 weeks  Diagnosis: Axis I: MDD, Recurrent, Mild    Generalized Anxiety Disorder        [name] Salvage, LCSW 03/11/2021

## 2021-03-12 LAB — SURGICAL PATHOLOGY

## 2021-03-19 ENCOUNTER — Ambulatory Visit: Payer: BC Managed Care – PPO | Admitting: Family Medicine

## 2021-03-24 ENCOUNTER — Ambulatory Visit (INDEPENDENT_AMBULATORY_CARE_PROVIDER_SITE_OTHER): Payer: BC Managed Care – PPO | Admitting: Psychiatry

## 2021-03-24 ENCOUNTER — Other Ambulatory Visit: Payer: Self-pay

## 2021-03-24 DIAGNOSIS — F330 Major depressive disorder, recurrent, mild: Secondary | ICD-10-CM | POA: Diagnosis not present

## 2021-03-24 DIAGNOSIS — F411 Generalized anxiety disorder: Secondary | ICD-10-CM

## 2021-03-24 NOTE — Progress Notes (Signed)
Virtual Visit via Telephone Note  I connected with [name] Fernandez on 03/24/21 at 3:15 PM EST by telephone and verified that I am speaking with the correct person using two identifiers.  Location: Patient: work Provider: [facility] office    I discussed the limitations, risks, security and privacy concerns of performing an evaluation and management service by telephone and the availability of in person appointments. I also discussed with the patient that there may be a patient responsible charge related to this service. The patient expressed understanding and agreed to proceed.  I provided 20  minutes of non-face-to-face time during this encounter.   Adah Salvage, LCSW     THERAPIST PROGRESS NOTE  Session Time: Tuesday 03/24/2021 3:15 PM - 3:35 PM   Participation Level: Active  Behavioral Response: Alert, less anxious  Type of Therapy: Individual Therapy  Treatment Goals addressed: Reduce overall level, frequency, intensity of the anxiety so that daily functioning is not impaired AEB decreasing panic episodes to 1 time per week for 3 consecutive weeks  Interventions: CBT and Supportive  Summary: [name] Fernandez is a 45 y.o. female who is referred for services by PCP Dr. Lodema Hong due to patient experiencing symptoms of anxiety and depression.  She denies any psychiatric hospitalizations.  Patient is a returning patient to this practice that she participated in therapy briefly in 2017.  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  Patient reports panic attacks, anxiety, feeling down, being irritable, sleeping excessively and decreased appetite.    Patient last was seen via virtual visit about 2 weeks ago.  She reports continued improved mood and decreased symptoms of anxiety since last session.  She has  experienced 1 panic attack triggered by concerns about her daughter who has autism.  She reports coping by using deep breathing and coping statements.  She reports decreased stress regarding household responsibilities as she has improved organization.  Per her report, she consistently keeps kitchen and bathroom clean.  She now is concerned about other areas of her health.  She reports being overwhelmed by laundry and expresses frustration regarding difficulties with her washer.  She has improved medication compliance through implementing plan to take medication in the morning.  She continues to have multiple responsibilities and is often multitasking.  She reports trying to manage this better.    Suicidal/Homicidal: Nowithout intent/plan  Therapist Response: Reviewed symptoms, discussed stressors, facilitated expression of thoughts and feelings, validated feelings, praised and reinforced patient's efforts to use helpful coping strategies to manage panic like symptoms, discussed effects, reviewed rationale for practicing deep breathing regularly to manage stress and anxiety, developed plan with patient to practice deep breathing daily, praised and reinforced patient's efforts to improve organization and assisted patient identify her accomplishments, discussed ways to cope with stress regarding managing laundry including organizing laundry baskets, also discussed realistic expectations of self regarding managing this task   Plan: Return again in 2 weeks  Diagnosis: Axis I: MDD, Recurrent, Mild    Generalized Anxiety Disorder        Adah Salvage, LCSW 03/24/2021

## 2021-04-07 ENCOUNTER — Other Ambulatory Visit: Payer: Self-pay

## 2021-04-07 ENCOUNTER — Ambulatory Visit (INDEPENDENT_AMBULATORY_CARE_PROVIDER_SITE_OTHER): Payer: BC Managed Care – PPO | Admitting: Psychiatry

## 2021-04-07 DIAGNOSIS — F330 Major depressive disorder, recurrent, mild: Secondary | ICD-10-CM

## 2021-04-07 NOTE — Progress Notes (Signed)
Virtual Visit via Video Note  I connected with [name] Fernandez on 04/07/21 at 2:10 PM EST by a video enabled telemedicine application and verified that I am speaking with the correct person using two identifiers.  Location: Patient: her office Provider: [facility] [place] office   I discussed the limitations of evaluation and management by telemedicine and the availability of in person appointments. The patient expressed understanding and agreed to proceed.  I provided 42 minutes of non-face-to-face time during this encounter.   [name] Salvage, [name] Fernandez      THERAPIST PROGRESS NOTE  Session Time: Tuesday 04/07/2021 2:10 PM - 2:52 PM   Participation Level: Active  Behavioral Response: Alert, less anxious  Type of Therapy: Individual Therapy  Treatment Goals addressed: Reduce overall level, frequency, intensity of the anxiety so that daily functioning is not impaired AEB decreasing panic episodes to 1 time per week for 3 consecutive weeks  Interventions: CBT and Supportive  Summary: [name] Fernandez is a 45 y.o. female who is referred for services by PCP Dr. Lodema Hong due to patient experiencing symptoms of anxiety and depression.  She denies any psychiatric hospitalizations.  Patient is a returning patient to this practice that she participated in therapy briefly in 2017.  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  Patient reports panic attacks, anxiety, feeling down, being irritable, sleeping excessively and decreased appetite.    Patient last was seen via virtual visit about 2 weeks ago.  She reports continued improved mood and decreased symptoms of anxiety since last session.  She reports experiencing 1 episode of panic like symptoms that was short-lived.  She reports decreased stress regarding household  responsibilities especially laundry.  She reports finally excepting help from her mother-in-law.  Patient reports pattern of difficulty delegating and trusting other people to help.  She reports thoughts of having to do things herself in order for things to be done correctly.  She continues to express frustration about achieving life balance between work, being a single parent, and personal relationships.  She also continues to multitask.  She expresses concern that she sometimes feels emotionally numb.   Suicidal/Homicidal: Nowithout intent/plan  Therapist Response: Reviewed symptoms, praised and reinforced patient's acceptance of help from mother-in-law and delegating task, discussed effects, reviewed treatment plan, obtained patient's participation/agreement to treatment plan review, discussed next steps for treatment, began to gather more information from patient about her history, assisted patient began to explore her thought patterns and the effects on her current functioning,  Plan: Return again in 2 weeks  Diagnosis: Axis I: MDD, Recurrent, Mild    Generalized Anxiety Disorder        [name] Salvage, [name] Fernandez 04/07/2021

## 2021-05-26 ENCOUNTER — Other Ambulatory Visit: Payer: Self-pay | Admitting: Family Medicine

## 2021-05-26 DIAGNOSIS — E1165 Type 2 diabetes mellitus with hyperglycemia: Secondary | ICD-10-CM

## 2021-07-29 ENCOUNTER — Telehealth: Payer: Self-pay

## 2021-07-29 ENCOUNTER — Other Ambulatory Visit: Payer: Self-pay

## 2021-07-29 ENCOUNTER — Other Ambulatory Visit: Payer: Self-pay | Admitting: Family Medicine

## 2021-07-29 DIAGNOSIS — E1165 Type 2 diabetes mellitus with hyperglycemia: Secondary | ICD-10-CM

## 2021-07-29 DIAGNOSIS — E785 Hyperlipidemia, unspecified: Secondary | ICD-10-CM

## 2021-07-29 NOTE — Telephone Encounter (Signed)
Patient called needs Labs order Urinalysis  / a1c / cmp14+EGFR / Lipid.  Next appt  04.11.2023. ?

## 2021-07-29 NOTE — Telephone Encounter (Signed)
Labs ordered (the dr did not request a urinalysis)  ?

## 2021-08-17 ENCOUNTER — Telehealth: Payer: Self-pay

## 2021-08-17 NOTE — Telephone Encounter (Signed)
Patient called left voicemail on Friday, 04.07.2023 @ 10:32, asked if Vitamin D level could be added to her blood work.  ?

## 2021-08-18 ENCOUNTER — Telehealth: Payer: Self-pay | Admitting: Family Medicine

## 2021-08-18 ENCOUNTER — Encounter: Payer: Self-pay | Admitting: Family Medicine

## 2021-08-18 ENCOUNTER — Other Ambulatory Visit: Payer: Self-pay

## 2021-08-18 ENCOUNTER — Ambulatory Visit: Payer: BC Managed Care – PPO | Admitting: Family Medicine

## 2021-08-18 VITALS — BP 149/91 | HR 69 | Ht 60.0 in | Wt 128.1 lb

## 2021-08-18 DIAGNOSIS — F411 Generalized anxiety disorder: Secondary | ICD-10-CM

## 2021-08-18 DIAGNOSIS — R8290 Unspecified abnormal findings in urine: Secondary | ICD-10-CM | POA: Diagnosis not present

## 2021-08-18 DIAGNOSIS — Z794 Long term (current) use of insulin: Secondary | ICD-10-CM

## 2021-08-18 DIAGNOSIS — I10 Essential (primary) hypertension: Secondary | ICD-10-CM

## 2021-08-18 DIAGNOSIS — E1165 Type 2 diabetes mellitus with hyperglycemia: Secondary | ICD-10-CM | POA: Diagnosis not present

## 2021-08-18 DIAGNOSIS — E559 Vitamin D deficiency, unspecified: Secondary | ICD-10-CM

## 2021-08-18 DIAGNOSIS — A530 Latent syphilis, unspecified as early or late: Secondary | ICD-10-CM | POA: Diagnosis not present

## 2021-08-18 DIAGNOSIS — Z1231 Encounter for screening mammogram for malignant neoplasm of breast: Secondary | ICD-10-CM

## 2021-08-18 DIAGNOSIS — F322 Major depressive disorder, single episode, severe without psychotic features: Secondary | ICD-10-CM | POA: Diagnosis not present

## 2021-08-18 DIAGNOSIS — R87612 Low grade squamous intraepithelial lesion on cytologic smear of cervix (LGSIL): Secondary | ICD-10-CM

## 2021-08-18 DIAGNOSIS — N760 Acute vaginitis: Secondary | ICD-10-CM

## 2021-08-18 HISTORY — DX: Unspecified abnormal findings in urine: R82.90

## 2021-08-18 LAB — CMP14+EGFR
ALT: 15 IU/L (ref 0–32)
AST: 11 IU/L (ref 0–40)
Albumin/Globulin Ratio: 1.3 (ref 1.2–2.2)
Albumin: 4.3 g/dL (ref 3.8–4.8)
Alkaline Phosphatase: 73 IU/L (ref 44–121)
BUN/Creatinine Ratio: 13 (ref 9–23)
BUN: 11 mg/dL (ref 6–24)
Bilirubin Total: <0.2 mg/dL (ref 0.0–1.2)
CO2: 24 mmol/L (ref 20–29)
Calcium: 9.6 mg/dL (ref 8.7–10.2)
Chloride: 99 mmol/L (ref 96–106)
Creatinine, Ser: 0.84 mg/dL (ref 0.57–1.00)
Globulin, Total: 3.3 g/dL (ref 1.5–4.5)
Glucose: 262 mg/dL — ABNORMAL HIGH (ref 70–99)
Potassium: 4.9 mmol/L (ref 3.5–5.2)
Sodium: 136 mmol/L (ref 134–144)
Total Protein: 7.6 g/dL (ref 6.0–8.5)
eGFR: 88 mL/min/{1.73_m2} (ref >59)

## 2021-08-18 LAB — LIPID PANEL
Chol/HDL Ratio: 4.5 ratio — ABNORMAL HIGH (ref 0.0–4.4)
Cholesterol, Total: 195 mg/dL (ref 100–199)
HDL: 43 mg/dL (ref >39)
LDL Chol Calc (NIH): 113 mg/dL — ABNORMAL HIGH (ref 0–99)
Triglycerides: 225 mg/dL — ABNORMAL HIGH (ref 0–149)
VLDL Cholesterol Cal: 39 mg/dL (ref 5–40)

## 2021-08-18 LAB — HEMOGLOBIN A1C
Est. average glucose Bld gHb Est-mCnc: 255 mg/dL
Hgb A1c MFr Bld: 10.5 % — ABNORMAL HIGH (ref 4.8–5.6)

## 2021-08-18 MED ORDER — FENOFIBRATE 48 MG PO TABS: 48.0000 mg | ORAL_TABLET | Freq: Every day | ORAL | 5 refills | Status: DC

## 2021-08-18 MED ORDER — INSULIN GLARGINE 100 UNIT/ML ~~LOC~~ SOLN: 50.0000 [IU] | Freq: Every day | SUBCUTANEOUS | 1 refills | Status: DC

## 2021-08-18 MED ORDER — GLIPIZIDE ER 10 MG PO TB24: ORAL_TABLET | ORAL | 5 refills | Status: DC

## 2021-08-18 MED ORDER — LISINOPRIL-HYDROCHLOROTHIAZIDE 20-25 MG PO TABS: 1.0000 | ORAL_TABLET | Freq: Every day | ORAL | 3 refills | Status: DC

## 2021-08-18 MED ORDER — BUSPIRONE HCL 5 MG PO TABS: ORAL_TABLET | ORAL | 1 refills | Status: DC

## 2021-08-18 MED ORDER — FLUOXETINE HCL 20 MG PO TABS: 20.0000 mg | ORAL_TABLET | Freq: Every day | ORAL | 5 refills | Status: DC

## 2021-08-18 NOTE — Assessment & Plan Note (Signed)
CCUA and reflex c/s 

## 2021-08-18 NOTE — Patient Instructions (Addendum)
F/u in 8 weeks` with diabetic log, call if you need me sooner, foot exam at visit ? ?PLEASE stop drinking sweet drinks ? ?TdAP today ? ?CCUA , reflex c/S if abn, and microalb today, ? ?Please schedule eye exam and and schedule your pap  with [facility] ? ?Goal for fasting blood sugar ranges from 90 to 130 and  at bedtime should be between 130 to 170.Diabetic log book provided ? ?Please schedule mammogram at checkout, 9/3 or after ? ?New additonal medication for cholesterol is fenofibrate ? ?Increase lisinopril/ HCTZ to new higher dose 20/25 one daily, script sent after visit to be started on 08/19/2021 and pt to be motified ? ?Start glipizide TWO every morning and lantus start at 30 units every day , increase by 5 units every week if needed, call/ send messages with questions ? ?Resume fluoxetine every day, and buspar only when you have panic  ? ?It is important that you exercise regularly at least 30 minutes 5 times a week. If you develop chest pain, have severe difficulty breathing, or feel very tired, stop exercising immediately and seek medical attention  ?Thanks for choosing [facility], we consider it a privelige to serve you. ? ? ?

## 2021-08-18 NOTE — Telephone Encounter (Signed)
Pls call pt advise her BP was high at visit , I have sent in a new higher dose of lisinopril/hctz to 20/25 oNE daily, this is double dose she is on, needs tostart asap ?If she had TdAP needs to be documented and let me knowso I can add the charge pls ?Extra labs to be ordered HIV, RPR  ?And urine to be sent for CCUa with reflex c/s and microalb ?Mammogram to be ordered an dcompleted ?THANKS... ??? Pls ask ?

## 2021-08-18 NOTE — Assessment & Plan Note (Signed)
Needs to continue therapy and resume fluoxetine daily ?Re eval at next visit ?

## 2021-08-18 NOTE — Assessment & Plan Note (Addendum)
Uncontroled, increase lisinopril /HCTZ to 20/25 one daily ?DASH diet and commitment to daily physical activity for a minimum of 30 minutes discussed and encouraged, as a part of hypertension management. ?The importance of attaining a healthy weight is also discussed. ? ? ?  08/18/2021  ?  4:28 PM 03/10/2021  ?  1:24 PM 02/06/2021  ? 12:51 PM 12/26/2020  ?  3:50 PM 10/21/2020  ?  6:32 PM 08/25/2020  ?  8:22 PM 07/28/2020  ?  1:04 PM  ?BP/Weight  ?Systolic BP 149 120 200 192 129 160   ?Diastolic BP 91 80 100 97 78 90   ?Wt. (Lbs) 128.12 126 121 125  120 116  ?BMI 25.02 kg/m2 24.61 kg/m2 23.63 kg/m2 24.41 kg/m2  23.44 kg/m2 22.65 kg/m2  ? ? ? ? ?

## 2021-08-18 NOTE — Assessment & Plan Note (Addendum)
Needs microalb ?Uncontrolled , needs to test at least once daily, inc glipizide to 20 mg daily, styart lantus 35 units daily and titrate up weekly by 5 units if needed, call/ message with questions, log to next visit ?[name] Fernandez is reminded of the importance of commitment to daily physical activity for 30 minutes or more, as able and the need to limit carbohydrate intake to 30 to 60 grams per meal to help with blood sugar control.  ? ?The need to take medication as prescribed, test blood sugar as directed, and to call between visits if there is a concern that blood sugar is uncontrolled is also discussed.  ? ?[name] Fernandez is reminded of the importance of daily foot exam, annual eye examination, and good blood sugar, blood pressure and cholesterol control. ? ? ?  Latest Ref Rng & Units 08/17/2021  ?  9:03 AM 08/25/2020  ?  5:11 PM 07/01/2020  ?  8:05 AM 03/26/2020  ?  8:23 AM 10/30/2018  ? 11:50 AM  ?Diabetic Labs  ?HbA1c 4.8 - 5.6 % 10.5   8.3    11.0   10.5    ?Microalbumin mg/dL     2.2    ?Chol 100 - 199 mg/dL 195    203   313   270    ?HDL >39 mg/dL 43    37   46   39    ?Calc LDL 0 - 99 mg/dL 113    139   224   185    ?Triglycerides 0 - 149 mg/dL 225    149   216   255    ?Creatinine 0.57 - 1.00 mg/dL 0.84     0.80   0.69    ? ? ?  08/18/2021  ?  4:28 PM 03/10/2021  ?  1:24 PM 02/06/2021  ? 12:51 PM 12/26/2020  ?  3:50 PM 10/21/2020  ?  6:32 PM 08/25/2020  ?  8:22 PM 07/28/2020  ?  1:04 PM  ?BP/Weight  ?Systolic BP 123456 123456 [id number] 129 160   ?Diastolic BP 91 80 123XX123 97 78 90   ?Wt. (Lbs) 128.12 126 121 125  120 116  ?BMI 25.02 kg/m2 24.61 kg/m2 23.63 kg/m2 24.41 kg/m2  23.44 kg/m2 22.65 kg/m2  ? ? ?  Latest Ref Rng & Units 08/25/2020  ?  3:20 PM 08/06/2020  ? 12:00 AM  ?Foot/eye exam completion dates  ?Eye Exam No Retinopathy  No Retinopathy       ?Foot Form Completion  Done   ?  ? This result is from an external source.  ? ? ?Needs eye exam ? ? ?

## 2021-08-18 NOTE — Assessment & Plan Note (Signed)
Requests re check for syphilis and HIV , states getting her personal life in order , will add on both tests to recent labs ?

## 2021-08-18 NOTE — Assessment & Plan Note (Signed)
F/u pap to be scheduled by pt, already established with Gy[facility] ?

## 2021-08-18 NOTE — Progress Notes (Signed)
? ?[name] Fernandez     MRN: [id number]      DOB: 19-Nov-1975 ? ? ?HPI ?[name] Fernandez is here for follow up and re-evaluation of chronic medical conditions, medication management and review of any available recent lab and radiology data.  ?Preventive health is updated, specifically  Cancer screening and Immunization.  Needs TdAP ?Questions or concerns regarding consultations or procedures which the PT has had in the interim are  addressed. ?The PT denies any adverse reactions to current medications since the last visit.  ?Not taking most of her meds consistently ?Not testing blood sugar ?C/o blurry vision and fatigue ?C/o malodorous urine x 1 week, denies fver, chills or flank pain Non compliant with meds and diet  ?Recently stopped prozac, thought it is making her "tired" now feels strange ?Needs to schedule [facility] for rept pap and also eye exam ?Requests vit D and STD and urine testing for infection ? ?ROS ?Denies recent fever or chills. ?Denies sinus pressure, nasal congestion, ear pain or sore throat. ?Denies chest congestion, productive cough or wheezing. ?Denies chest pains, palpitations and leg swelling ?Denies abdominal pain, nausea, vomiting,diarrhea or constipation.   ? ?Denies joint pain, swelling and limitation in mobility. ?Denies headaches, seizures, numbness, or tingling. ?Denies  uncontrolled depression, anxiety or insomnia. ?Denies skin break down or rash. ? ? ?PE ? ?BP (!) 149/91   Pulse 69   Ht 5' (1.524 m)   Wt 128 lb 1.9 oz (58.1 kg)   LMP  (LMP Unknown)   SpO2 96%   BMI 25.02 kg/m?  ? ?Patient alert and oriented and in no cardiopulmonary distress. ? ?HEENT: No facial asymmetry, EOMI,     Neck supple . ? ?Chest: Clear to auscultation bilaterally. ? ?CVS: S1, S2 no murmurs, no S3.Regular rate. ? ?ABD: Soft non tender.  ? ?Ext: No edema ? ?MS: Adequate ROM spine, shoulders, hips and knees. ? ?Skin: Intact, no ulcerations or rash noted. ? ?Psych: Good eye contact, normal affect. Memory intact not  anxious or depressed appearing. ? ?CNS: CN 2-12 intact, power,  normal throughout.no focal deficits noted. ? ? ?Assessment & Plan ? ?Essential hypertension ?Uncontroled, increase lisinopril /HCTZ to 20/25 one daily ?DASH diet and commitment to daily physical activity for a minimum of 30 minutes discussed and encouraged, as a part of hypertension management. ?The importance of attaining a healthy weight is also discussed. ? ? ?  08/18/2021  ?  4:28 PM 03/10/2021  ?  1:24 PM 02/06/2021  ? 12:51 PM 12/26/2020  ?  3:50 PM 10/21/2020  ?  6:32 PM 08/25/2020  ?  8:22 PM 07/28/2020  ?  1:04 PM  ?BP/Weight  ?Systolic BP 149 120 200 192 129 160   ?Diastolic BP 91 80 100 97 78 90   ?Wt. (Lbs) 128.12 126 121 125  120 116  ?BMI 25.02 kg/m2 24.61 kg/m2 23.63 kg/m2 24.41 kg/m2  23.44 kg/m2 22.65 kg/m2  ? ? ? ? ? ?Type 2 diabetes mellitus with hyperglycemia (HCC) ?Needs microalb ?Uncontrolled , needs to test at least once daily, inc glipizide to 20 mg daily, styart lantus 35 units daily and titrate up weekly by 5 units if needed, call/ message with questions, log to next visit ?[name] Fernandez is reminded of the importance of commitment to daily physical activity for 30 minutes or more, as able and the need to limit carbohydrate intake to 30 to 60 grams per meal to help with blood sugar control.  ? ?The need to take  medication as prescribed, test blood sugar as directed, and to call between visits if there is a concern that blood sugar is uncontrolled is also discussed.  ? ?[name] Fernandez is reminded of the importance of daily foot exam, annual eye examination, and good blood sugar, blood pressure and cholesterol control. ? ? ?  Latest Ref Rng & Units 08/17/2021  ?  9:03 AM 08/25/2020  ?  5:11 PM 07/01/2020  ?  8:05 AM 03/26/2020  ?  8:23 AM 10/30/2018  ? 11:50 AM  ?Diabetic Labs  ?HbA1c 4.8 - 5.6 % 10.5   8.3    11.0   10.5    ?Microalbumin mg/dL     2.2    ?Chol 100 - 199 mg/dL 195    203   313   270    ?HDL >39 mg/dL 43    37   46   39    ?Calc  LDL 0 - 99 mg/dL 113    139   224   185    ?Triglycerides 0 - 149 mg/dL 225    149   216   255    ?Creatinine 0.57 - 1.00 mg/dL 0.84     0.80   0.69    ? ? ?  08/18/2021  ?  4:28 PM 03/10/2021  ?  1:24 PM 02/06/2021  ? 12:51 PM 12/26/2020  ?  3:50 PM 10/21/2020  ?  6:32 PM 08/25/2020  ?  8:22 PM 07/28/2020  ?  1:04 PM  ?BP/Weight  ?Systolic BP 123456 123456 [id number] 129 160   ?Diastolic BP 91 80 123XX123 97 78 90   ?Wt. (Lbs) 128.12 126 121 125  120 116  ?BMI 25.02 kg/m2 24.61 kg/m2 23.63 kg/m2 24.41 kg/m2  23.44 kg/m2 22.65 kg/m2  ? ? ?  Latest Ref Rng & Units 08/25/2020  ?  3:20 PM 08/06/2020  ? 12:00 AM  ?Foot/eye exam completion dates  ?Eye Exam No Retinopathy  No Retinopathy       ?Foot Form Completion  Done   ?  ? This result is from an external source.  ? ? ?Needs eye exam ? ? ? ?Positive RPR test ?Requests re check for syphilis and HIV , states getting her personal life in order , will add on both tests to recent labs ? ?Depression, major, single episode, severe ([place]) ?Needs to continue therapy and resume fluoxetine daily ?Re eval at next visit ? ?GAD (generalized anxiety disorder) ?States uses buspar only for extreme anxiety / panic, will dose at 1 daily as meeded ? ?LGSIL on Pap smear of cervix ?F/u pap to be scheduled by pt, already established with Gy[facility] ? ?Vulvovaginitis ?STD testing for HIV an RPR ? ?Vitamin D deficiency ?Updated lab needed at/ before next visit. ? ? ?Malodorous urine ?CCUA and reflex c/s ? ?

## 2021-08-18 NOTE — Telephone Encounter (Signed)
Vit D added.  

## 2021-08-18 NOTE — Assessment & Plan Note (Signed)
States uses buspar only for extreme anxiety / panic, will dose at 1 daily as meeded ?

## 2021-08-18 NOTE — Assessment & Plan Note (Signed)
STD testing for HIV an RPR ?

## 2021-08-18 NOTE — Assessment & Plan Note (Signed)
Updated lab needed at/ before next visit.   

## 2021-08-19 ENCOUNTER — Telehealth: Payer: Self-pay

## 2021-08-19 ENCOUNTER — Other Ambulatory Visit: Payer: Self-pay

## 2021-08-19 ENCOUNTER — Other Ambulatory Visit: Payer: Self-pay | Admitting: Family Medicine

## 2021-08-19 LAB — POCT URINALYSIS DIP (CLINITEK)
Bilirubin, UA: NEGATIVE
Blood, UA: NEGATIVE
Glucose, UA: >=1000 mg/dL — AB
Ketones, POC UA: NEGATIVE mg/dL
Leukocytes, UA: NEGATIVE
Nitrite, UA: NEGATIVE
POC PROTEIN,UA: NEGATIVE
Spec Grav, UA: 1.02 (ref 1.010–1.025)
Urobilinogen, UA: 0.2 E.U./dL
pH, UA: 6 (ref 5.0–8.0)

## 2021-08-19 NOTE — Addendum Note (Signed)
Addended by: Jasper Riling on: 08/19/2021 08:14 AM ? ? Modules accepted: Orders ? ?

## 2021-08-19 NOTE — Telephone Encounter (Signed)
Patient aware.

## 2021-08-19 NOTE — Telephone Encounter (Signed)
Patient called notice her labs came back abnormal, asking to send in vitamin D to pharmacy. ? ?Pharmacy: [facility] ?

## 2021-08-20 ENCOUNTER — Other Ambulatory Visit: Payer: Self-pay | Admitting: Family Medicine

## 2021-08-20 DIAGNOSIS — E1165 Type 2 diabetes mellitus with hyperglycemia: Secondary | ICD-10-CM

## 2021-08-20 NOTE — Progress Notes (Signed)
Referred to Dr Fransico Him for management of uncontrolled iDDM ?

## 2021-08-20 NOTE — Telephone Encounter (Signed)
Pt aware.

## 2021-08-21 LAB — SPECIMEN STATUS REPORT

## 2021-08-21 LAB — VITAMIN D 25 HYDROXY (VIT D DEFICIENCY, FRACTURES): Vit D, 25-Hydroxy: 16.6 ng/mL — ABNORMAL LOW (ref 30.0–100.0)

## 2021-08-23 LAB — MICROALBUMIN / CREATININE URINE RATIO
Creatinine, Urine: 65.9 mg/dL
Microalb/Creat Ratio: 94 mg/g creat — ABNORMAL HIGH (ref 0–29)
Microalbumin, Urine: 61.9 ug/mL

## 2021-08-25 ENCOUNTER — Ambulatory Visit: Payer: BC Managed Care – PPO | Admitting: "Endocrinology

## 2021-09-07 ENCOUNTER — Other Ambulatory Visit: Payer: Self-pay | Admitting: Family Medicine

## 2021-09-07 DIAGNOSIS — E785 Hyperlipidemia, unspecified: Secondary | ICD-10-CM

## 2021-09-07 DIAGNOSIS — E1165 Type 2 diabetes mellitus with hyperglycemia: Secondary | ICD-10-CM

## 2021-09-15 ENCOUNTER — Encounter: Payer: Self-pay | Admitting: Family Medicine

## 2021-09-16 MED ORDER — LOSARTAN POTASSIUM-HCTZ 50-12.5 MG PO TABS: 1.0000 | ORAL_TABLET | Freq: Every day | ORAL | 2 refills | Status: DC

## 2021-09-25 ENCOUNTER — Other Ambulatory Visit: Payer: Self-pay | Admitting: Family Medicine

## 2021-09-25 DIAGNOSIS — E785 Hyperlipidemia, unspecified: Secondary | ICD-10-CM

## 2021-09-25 DIAGNOSIS — E1165 Type 2 diabetes mellitus with hyperglycemia: Secondary | ICD-10-CM

## 2021-10-13 ENCOUNTER — Other Ambulatory Visit: Payer: Self-pay

## 2021-10-13 ENCOUNTER — Encounter: Payer: Self-pay | Admitting: Family Medicine

## 2021-10-13 ENCOUNTER — Ambulatory Visit: Payer: BC Managed Care – PPO | Admitting: Family Medicine

## 2021-10-13 MED ORDER — INSULIN GLARGINE 100 UNIT/ML ~~LOC~~ SOLN: 50.0000 [IU] | Freq: Every day | SUBCUTANEOUS | 1 refills | Status: DC

## 2021-10-13 MED ORDER — VITAMIN D (ERGOCALCIFEROL) 1.25 MG (50000 UNIT) PO CAPS: 50000.0000 [IU] | ORAL_CAPSULE | ORAL | 5 refills | Status: DC

## 2021-10-21 ENCOUNTER — Ambulatory Visit: Payer: BC Managed Care – PPO | Admitting: Obstetrics

## 2021-10-21 ENCOUNTER — Encounter: Payer: Self-pay | Admitting: Obstetrics

## 2021-10-21 ENCOUNTER — Other Ambulatory Visit (HOSPITAL_COMMUNITY)
Admission: RE | Admit: 2021-10-21 | Discharge: 2021-10-21 | Disposition: A | Discharge status: Home / Self Care | Payer: BC Managed Care – PPO | Source: Ambulatory Visit | Attending: Obstetrics | Admitting: Obstetrics

## 2021-10-21 VITALS — BP 122/84 | Ht 62.0 in | Wt 130.6 lb

## 2021-10-21 DIAGNOSIS — R87612 Low grade squamous intraepithelial lesion on cytologic smear of cervix (LGSIL): Secondary | ICD-10-CM | POA: Diagnosis present

## 2021-10-21 DIAGNOSIS — Z113 Encounter for screening for infections with a predominantly sexual mode of transmission: Secondary | ICD-10-CM | POA: Diagnosis present

## 2021-10-21 DIAGNOSIS — Z124 Encounter for screening for malignant neoplasm of cervix: Secondary | ICD-10-CM | POA: Insufficient documentation

## 2021-10-21 DIAGNOSIS — R87610 Atypical squamous cells of undetermined significance on cytologic smear of cervix (ASC-US): Secondary | ICD-10-CM | POA: Diagnosis not present

## 2021-10-21 NOTE — Progress Notes (Signed)
Obstetrics & Gynecology Office Visit   Chief Complaint:  Chief Complaint  Patient presents with   Gynecologic Exam    Patient would like to discuss change in menstrual cycle and flow, patient states that she would like to inquire if she is going through menopause. Patient would also like to discuss today possibility of getting pregnant and risks due to her age.     History of Present Illness: [name] Fernandez presents for a repeat pap smear. 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 Has had colposcopy this year, and was advised to return in 6 months for a repeat pap smear. Her last pap result showed High Risk HPV and an LGSIL pap. She is sexually active and is not contracepting. She would accept an unplanned pregnancy.  She is also requesting some STI screening today.   Review of Systems:  Review of Systems  Constitutional: Negative.   HENT: Negative.    Eyes: Negative.   Respiratory: Negative.    Cardiovascular: Negative.   Gastrointestinal: Negative.   Musculoskeletal: Negative.   Skin: Negative.   Neurological: Negative.   Endo/Heme/Allergies: Negative.   Psychiatric/Behavioral: Negative.       Past Medical History:  Past Medical History:  Diagnosis Date   Abnormal bleeding in menstrual cycle 06/05/2016   Anxiety    Arthritis    Chlamydia infection 03/15/2016   Chronic hypertension affecting pregnancy 10/11/2018   Depression    Depression    Phreesia 04/02/2020   Diabetes mellitus    Diabetes mellitus without complication (HCC)    Phreesia 04/02/2020   GERD (gastroesophageal reflux disease)    Headache(784.0)    Hyperlipidemia    Hypertension    Multigravida of advanced maternal age in first trimester 10/11/2018    Past Surgical History:  Past Surgical History:  Procedure Laterality Date   BREAST BIOPSY     BREAST SURGERY     right lumpectomy, benign   CHOLECYSTECTOMY N/A 12/27/2013   Procedure: LAPAROSCOPIC CHOLECYSTECTOMY ;  Surgeon:  Marlane Hatcher, MD;  Location: [facility];  Service: General;  Laterality: N/A;    Gynecologic History: No LMP recorded (within weeks).  Obstetric History: [id number]  Family History:  Family History  Problem Relation Age of Onset   Depression Mother    Alcohol abuse Mother    Depression Brother    Sickle cell anemia Brother    Hypertension Brother    Diabetes Father    COPD Father    Anxiety disorder Maternal Aunt     Social History:  Social History   Socioeconomic History   Marital status: Divorced    Spouse name: Not on file   Number of children: 3   Years of education: Not on file   Highest education level: Not on file  Occupational History   Not on file  Tobacco Use   Smoking status: Never   Smokeless tobacco: Never  Substance and Sexual Activity   Alcohol use: Not Currently    Alcohol/week: 0.0 standard drinks of alcohol    Comment: occ. wine a glass   Drug use: No   Sexual activity: Yes    Birth control/protection: None  Other Topics Concern   Not on file  Social History Narrative   Not on file   Social Determinants of Health   Financial Resource Strain: Not on file  Food Insecurity: Not on file  Transportation Needs: Not on file  Physical Activity: Not on file  Stress: Not on file  Social Connections: Not on file  Intimate Partner Violence: Not on file    Allergies:  Allergies  Allergen Reactions   Janumet [Sitagliptin-Metformin Hcl] Nausea And Vomiting   Lisinopril Cough    Medications: Prior to Admission medications   Medication Sig Start Date End Date Taking? Authorizing Provider  amLODipine (NORVASC) 10 MG tablet Take 1 tablet (10 mg total) by mouth daily. 12/26/20  Yes Kerri PerchesSimpson, Bryttany Tortorelli E, MD  busPIRone (BUSPAR) 5 MG tablet Take one tablet by mouth bonce daily , as needed, `for panic , and anxiety 08/18/21  Yes Kerri PerchesSimpson, Geraline Halberstadt E, MD  fenofibrate (TRICOR) 48 MG tablet Take 1 tablet (48 mg total) by mouth daily. 08/18/21  Yes Kerri PerchesSimpson,  Orpheus Hayhurst E, MD  fexofenadine-pseudoephedrine (ALLEGRA-D 24) 180-240 MG 24 hr tablet Take 1 tablet by mouth daily.   Yes [provider]  glipiZIDE (GLUCOTROL XL) 10 MG 24 hr tablet Take two tablets by mouth with breakfast every morning 08/18/21  Yes Kerri PerchesSimpson, Buel Molder E, MD  insulin glargine (LANTUS) 100 UNIT/ML injection Inject 0.5 mLs (50 Units total) into the skin daily. 10/13/21  Yes Kerri PerchesSimpson, Awanda Wilcock E, MD  losartan-hydrochlorothiazide (HYZAAR) 50-12.5 MG tablet Take 1 tablet by mouth daily. 09/16/21  Yes Kerri PerchesSimpson, Sayeed Weatherall E, MD  Multiple Vitamin (MULTIVITAMIN) capsule Take 1 capsule by mouth daily.   Yes [provider]  rosuvastatin (CRESTOR) 40 MG tablet Take 1 tablet by mouth once daily 09/28/21  Yes Kerri PerchesSimpson, Yue Flanigan E, MD  Vitamin D, Ergocalciferol, (DRISDOL) 1.25 MG (50000 UNIT) CAPS capsule Take 1 capsule (50,000 Units total) by mouth every 7 (seven) days. 10/13/21  Yes Kerri PerchesSimpson, Valine Drozdowski E, MD  FLUoxetine (PROZAC) 20 MG tablet Take 1 tablet (20 mg total) by mouth daily. Patient not taking: Reported on 10/21/2021 08/18/21   Kerri PerchesSimpson, Yurianna Tusing E, MD    Physical Exam Vitals:  Vitals:   10/21/21 1540  BP: 122/84   No LMP recorded (within weeks).  Physical Exam Constitutional:      Appearance: Normal appearance. She is normal weight.  HENT:     Head: Normocephalic and atraumatic.  Cardiovascular:     Rate and Rhythm: Normal rate and regular rhythm.  Pulmonary:     Effort: Pulmonary effort is normal.     Breath sounds: Normal breath sounds.  Abdominal:     General: Abdomen is flat.     Palpations: Abdomen is soft.  Genitourinary:    General: Normal vulva.     Rectum: Normal.     Comments: No exteranl lesions . Normal vaginal rugae, no vaginal discharge. Musculoskeletal:     Cervical back: Normal range of motion and neck supple.  Neurological:     General: No focal deficit present.     Mental Status: She is alert.      Assessment: 46 y.o. [id number]  for pap  smear.Hx of LGSIL pap with High Risk HPV and colposcopy. STI testing  Plan: Problem List Items Addressed This Visit       Other   LGSIL on Pap smear of cervix   Relevant Orders   Cytology - PAP   Other Visit Diagnoses     Cervical cancer screening    -  Primary   Relevant Orders   Cytology - PAP   Screen for STD (sexually transmitted disease)       Relevant Orders   HEP, RPR, HIV Panel   Hepatitis C antibody     WE will f/u based on the test results.  Mirna Mires, CNM  10/21/2021 5:33 PM

## 2021-10-22 LAB — HEP, RPR, HIV PANEL
HIV Screen 4th Generation wRfx: NONREACTIVE
Hepatitis B Surface Ag: NEGATIVE
RPR Ser Ql: NONREACTIVE

## 2021-10-22 LAB — HEPATITIS C ANTIBODY: Hep C Virus Ab: NONREACTIVE

## 2021-10-23 LAB — CERVICOVAGINAL ANCILLARY ONLY
Bacterial Vaginitis (gardnerella): NEGATIVE
Chlamydia: NEGATIVE
Comment: NEGATIVE
Comment: NEGATIVE
Comment: NEGATIVE
Comment: NORMAL
Neisseria Gonorrhea: NEGATIVE
Trichomonas: NEGATIVE

## 2021-10-24 ENCOUNTER — Encounter: Payer: Self-pay | Admitting: Obstetrics

## 2021-10-26 LAB — CYTOLOGY - PAP
Comment: NEGATIVE
Diagnosis: UNDETERMINED — AB
High risk HPV: NEGATIVE

## 2021-11-24 LAB — HM DIABETES EYE EXAM

## 2021-12-02 ENCOUNTER — Ambulatory Visit: Payer: BC Managed Care – PPO | Admitting: Family Medicine

## 2021-12-04 ENCOUNTER — Encounter: Payer: Self-pay | Admitting: Family Medicine

## 2021-12-18 ENCOUNTER — Other Ambulatory Visit: Payer: Self-pay | Admitting: Family Medicine

## 2021-12-18 DIAGNOSIS — E785 Hyperlipidemia, unspecified: Secondary | ICD-10-CM

## 2021-12-18 DIAGNOSIS — E1165 Type 2 diabetes mellitus with hyperglycemia: Secondary | ICD-10-CM

## 2022-01-07 ENCOUNTER — Other Ambulatory Visit: Payer: Self-pay | Admitting: Family Medicine

## 2022-01-07 DIAGNOSIS — E785 Hyperlipidemia, unspecified: Secondary | ICD-10-CM

## 2022-01-07 DIAGNOSIS — E1165 Type 2 diabetes mellitus with hyperglycemia: Secondary | ICD-10-CM

## 2022-02-23 ENCOUNTER — Other Ambulatory Visit: Payer: Self-pay | Admitting: Family Medicine

## 2022-03-01 ENCOUNTER — Other Ambulatory Visit: Payer: Self-pay | Admitting: Family Medicine

## 2022-03-11 ENCOUNTER — Telehealth: Payer: Self-pay | Admitting: Family Medicine

## 2022-03-11 NOTE — Telephone Encounter (Signed)
Pt called stating that she is it wanting to have labs done before her appt on 04/22/22. States she wants added an A1C, Cholesterol and b12. Can this be ordered?    Also wants referral resent to Dr. Dorris Fetch.   Can you please call pt & let her know when this has been done?

## 2022-04-04 ENCOUNTER — Other Ambulatory Visit: Payer: Self-pay | Admitting: Family Medicine

## 2022-04-04 DIAGNOSIS — E1165 Type 2 diabetes mellitus with hyperglycemia: Secondary | ICD-10-CM

## 2022-04-04 DIAGNOSIS — E785 Hyperlipidemia, unspecified: Secondary | ICD-10-CM

## 2022-04-05 ENCOUNTER — Other Ambulatory Visit: Payer: Self-pay | Admitting: Family Medicine

## 2022-04-05 ENCOUNTER — Telehealth: Payer: Self-pay | Admitting: Family Medicine

## 2022-04-05 ENCOUNTER — Other Ambulatory Visit: Payer: Self-pay

## 2022-04-05 ENCOUNTER — Telehealth: Payer: Self-pay

## 2022-04-05 DIAGNOSIS — E1165 Type 2 diabetes mellitus with hyperglycemia: Secondary | ICD-10-CM

## 2022-04-05 MED ORDER — INSULIN GLARGINE 100 UNIT/ML ~~LOC~~ SOLN: SUBCUTANEOUS | 0 refills | Status: DC

## 2022-04-05 MED ORDER — GLIPIZIDE ER 10 MG PO TB24: ORAL_TABLET | ORAL | 0 refills | Status: DC

## 2022-04-05 NOTE — Telephone Encounter (Signed)
Need med refill  glipiZIDE (GLUCOTROL XL) 10 MG 24 hr table   LANTUS 100 UNIT/ML injection [[id number]]   Patient needs new referral to dr nida office.call patient when this is placed urgent referral.  Asking if can get labs check Vitamin D and Vitamin B, and needs all labs ordered CBC . Lipid panel, hemoglobin A1c Call patient [phone] when ordered.  Pharmacy  [facility] [address] [place] - [address] [place] [id number] [place], [place] [address] Phone: [phone]  Fax: [phone] DEA #: --  DAW Reason: --

## 2022-04-05 NOTE — Telephone Encounter (Signed)
Pt wants endocrinology referral, wants labs before Dec 14 visit & needs refills.    Labs: pregnancy test (blood), A1C, CBC, etc?   Meds:   amLODipine (NORVASC) 5 MG tablet   rosuvastatin (CRESTOR) 40 MG tablet   glipiZIDE (GLUCOTROL XL) 10 MG 24 hr tablet   LANTUS 100 UNIT/ML injection   Phar: [facility]

## 2022-04-05 NOTE — Telephone Encounter (Signed)
error 

## 2022-04-05 NOTE — Telephone Encounter (Signed)
Meds refilled and urgent referral to endo placed

## 2022-04-05 NOTE — Telephone Encounter (Signed)
Has appt Dec 14. Wants all labs ordered including pregnancy test

## 2022-04-05 NOTE — Telephone Encounter (Signed)
Patient calling in to state she believes she may be pregnant. She states a history of irregular cycles and has only lighted spotted once this month. She reports 2 negative pregnancy tests. Patient reports increased smell, emotional, food cravings, increased basal temp and breast tenderness. Advised patient to schedule a nurse visit for a pregnancy confirmation.

## 2022-04-06 ENCOUNTER — Other Ambulatory Visit: Payer: Self-pay | Admitting: Family Medicine

## 2022-04-06 DIAGNOSIS — E785 Hyperlipidemia, unspecified: Secondary | ICD-10-CM

## 2022-04-06 DIAGNOSIS — E1165 Type 2 diabetes mellitus with hyperglycemia: Secondary | ICD-10-CM

## 2022-04-06 DIAGNOSIS — N912 Amenorrhea, unspecified: Secondary | ICD-10-CM

## 2022-04-06 DIAGNOSIS — E559 Vitamin D deficiency, unspecified: Secondary | ICD-10-CM

## 2022-04-06 NOTE — Telephone Encounter (Signed)
Labs ordered and mychart message sent to pt.

## 2022-04-12 ENCOUNTER — Ambulatory Visit (INDEPENDENT_AMBULATORY_CARE_PROVIDER_SITE_OTHER): Payer: BC Managed Care – PPO

## 2022-04-12 VITALS — BP 139/87 | HR 81 | Resp 16 | Wt 126.7 lb

## 2022-04-12 DIAGNOSIS — Z7251 High risk heterosexual behavior: Secondary | ICD-10-CM

## 2022-04-12 DIAGNOSIS — Z3202 Encounter for pregnancy test, result negative: Secondary | ICD-10-CM | POA: Diagnosis not present

## 2022-04-12 LAB — POCT URINE PREGNANCY: Preg Test, Ur: NEGATIVE

## 2022-04-12 NOTE — Progress Notes (Signed)
Subjective:    [name] Fernandez is a 46 y.o. female who presents for evaluation of amenorrhea. She believes she could be pregnant. Pregnancy is desired.  Last period was abnormal. Patient describes LP as shorter than usual and states that blood appeared to be dark in color, flow last 2-3 days and described as very light.    No LMP recorded. The following portions of the patient's history were reviewed and updated as appropriate: allergies, current medications, and problem list.   Lab Review Urine HCG: negative    Assessment:    Absence of menstruation.     Plan:    Pregnancy Test:  Negative. Briefly discussed need to schedule office visit to address concerns of possibility of pregnancy, change in menstrual cycle and address hormone levels.   Maye Hides, [facility]

## 2022-04-12 NOTE — Patient Instructions (Signed)
Preparing for Pregnancy If you are planning to become pregnant, talk to your health care provider about preconception care. This type of care helps you prepare for a safe and healthy pregnancy. During this visit, your health care provider will: Do a complete physical exam, including a vaginal exam. Take your complete medical history. Give you information, answer your questions, and address possible problems. What should be on my preconception checklist? Medical history Tell your health care provider about any medical conditions you have or have had. Your pregnancy or your ability to become pregnant may be affected by long-term (chronic) conditions, such as: Diabetes. High blood pressure (hypertension). Problems with your thyroid. Tell your health care provider about your family's medical history and your partner's medical history. If needed, discuss the benefits of genetic testing. This checks for conditions that may be passed from parent to child. Tell your health care provider if you have or have had any sexually transmitted infections (STIs). These can affect your pregnancy. In some cases, they can pass to your baby. Tell your health care provider about: Any problems you had with previous pregnancies. Any medicines you take. These include vitamins, herbs, supplements, and over-the-counter medicines. Your vaccine history. Discuss any vaccines that you may need. Diet Ask your health care provider what foods to eat to get a balance of nutrients. This is very important when you are pregnant or preparing to get pregnant. Take a folic acid supplement of 400 mcg daily and eat foods rich in folic acid to help prevent certain birth defects. You should also take a prenatal vitamin before pregnancy and keep taking it while pregnant. Ask your health care provider to help you reach a healthy weight before pregnancy. If you are overweight, you may have a higher risk for certain problems. These include  having trouble getting pregnant (infertility), hypertension, diabetes, and early (preterm) birth. If you are underweight, you are more likely to have a baby who has a low birth weight. Lifestyle, work, and home Let your health care provider know about: Use of alcohol, illegal drugs, or tobacco. These products can cause serious problems for your pregnancy and your baby. Fun and leisure activities that may put you at risk during pregnancy. These may include skiing and certain exercise programs. Any plans to travel out of the country, especially to places with an active outbreak of Zika virus. Harmful substances that you may be exposed to at work or home. These include chemicals, pesticides, radiation, and substances from cat litter boxes. Any concerns you have for your safety at home. Mental health Tell your health care provider about: Any history of mental health conditions, including feelings of depression, sadness, or anxiety. Any thoughts of suicide or previous attempts of suicide. Any medicines that you take for a mental health condition. These include herbs and supplements. How do I know that I am pregnant? You may be pregnant if you have been sexually active, and you miss your period. Other symptoms of early pregnancy may include: Mild cramping. Feeling more tired than usual. Nausea and vomiting. These may be signs of morning sickness. Breast enlargement and tenderness. Take a home pregnancy test if you have any of these symptoms. This test checks for a hormone in your urine called human chorionic gonadotropin, or hCG. Your body begins to make this hormone during early pregnancy. Wait until at least the first day after you miss your period to take a test. What should I do if I become pregnant? Schedule a visit with your   health care provider as soon as you suspect you are pregnant. Talk to your health care provider if you are taking medicines to see if they are safe to take during  pregnancy. Follow these instructions at home: Eating and drinking  Follow instructions from your health care provider about what you may eat and drink. Drink enough fluids to keep your urine pale yellow. Eat a balanced diet. This includes fresh fruits and vegetables, whole grains, lean meats, low-fat dairy products, healthy fats, and foods that are high in fiber. Wash all fresh fruits and vegetables before eating. Ask to meet with a nutritionist or registered dietitian for help with meal planning and goals. Avoid eating raw or undercooked meat and seafood. Avoid eating or drinking unpasteurized dairy products. Heat all processed foods to 165F. This includes precooked or cured meat, such as sausages or meat loaves. Lifestyle     Get regular exercise. Try to be active for at least 30 minutes a day on most days of the week. Ask your health care provider which activities are safe during pregnancy. Ask your health care provider about ways to manage stress. Maintain a healthy weight. Avoid toxic fumes and chemicals. Avoid cleaning cat litter boxes. Cat stool (feces) may contain a harmful parasite called toxoplasma. Avoid travel to countries where Zika virus is common. Do not use any products that contain nicotine or tobacco. These products include cigarettes, chewing tobacco, and vaping devices, such as e-cigarettes. If you need help quitting, ask your health care provider. Do not drink alcohol or use drugs. General instructions Keep an accurate record of your menstrual periods. This makes it easier for your health care provider to determine your baby's due date. Take over-the-counter and prescription medicines only as told by your health care provider. Manage any chronic conditions, such as hypertension and diabetes, as told by your health care provider. This information is not intended to replace advice given to you by your health care provider. Make sure you discuss any questions you have  with your health care provider. Document Revised: 09/16/2021 Document Reviewed: 09/16/2021 Elsevier Patient Education  2023 Elsevier Inc.  

## 2022-04-13 LAB — HUMAN CHORIONIC GONADOTROPIN(HCG),B-SUBUNIT,QUANTITATIVE): HCG, Beta Chain, Quant, S: <1 m[IU]/mL

## 2022-04-21 LAB — CBC WITH DIFFERENTIAL/PLATELET
Basophils Absolute: 0.1 10*3/uL (ref 0.0–0.2)
Basos: 1 %
EOS (ABSOLUTE): 0.1 10*3/uL (ref 0.0–0.4)
Eos: 1 %
Hematocrit: 36.7 % (ref 34.0–46.6)
Hemoglobin: 12.2 g/dL (ref 11.1–15.9)
Immature Grans (Abs): 0 10*3/uL (ref 0.0–0.1)
Immature Granulocytes: 0 %
Lymphocytes Absolute: 2 10*3/uL (ref 0.7–3.1)
Lymphs: 28 %
MCH: 26.8 pg (ref 26.6–33.0)
MCHC: 33.2 g/dL (ref 31.5–35.7)
MCV: 81 fL (ref 79–97)
Monocytes Absolute: 0.8 10*3/uL (ref 0.1–0.9)
Monocytes: 10 %
Neutrophils Absolute: 4.3 10*3/uL (ref 1.4–7.0)
Neutrophils: 60 %
Platelets: 346 10*3/uL (ref 150–450)
RBC: 4.55 x10E6/uL (ref 3.77–5.28)
RDW: 13 % (ref 11.7–15.4)
WBC: 7.2 10*3/uL (ref 3.4–10.8)

## 2022-04-21 LAB — CMP14+EGFR
ALT: 17 IU/L (ref 0–32)
AST: 17 IU/L (ref 0–40)
Albumin/Globulin Ratio: 1.3 (ref 1.2–2.2)
Albumin: 4.1 g/dL (ref 3.9–4.9)
Alkaline Phosphatase: 68 IU/L (ref 44–121)
BUN/Creatinine Ratio: 9 (ref 9–23)
BUN: 8 mg/dL (ref 6–24)
Bilirubin Total: <0.2 mg/dL (ref 0.0–1.2)
CO2: 25 mmol/L (ref 20–29)
Calcium: 9.4 mg/dL (ref 8.7–10.2)
Chloride: 101 mmol/L (ref 96–106)
Creatinine, Ser: 0.88 mg/dL (ref 0.57–1.00)
Globulin, Total: 3.1 g/dL (ref 1.5–4.5)
Glucose: 167 mg/dL — ABNORMAL HIGH (ref 70–99)
Potassium: 4.5 mmol/L (ref 3.5–5.2)
Sodium: 139 mmol/L (ref 134–144)
Total Protein: 7.2 g/dL (ref 6.0–8.5)
eGFR: 83 mL/min/{1.73_m2} (ref >59)

## 2022-04-21 LAB — VITAMIN D 25 HYDROXY (VIT D DEFICIENCY, FRACTURES): Vit D, 25-Hydroxy: 25 ng/mL — ABNORMAL LOW (ref 30.0–100.0)

## 2022-04-21 LAB — LIPID PANEL
Chol/HDL Ratio: 4.3 ratio (ref 0.0–4.4)
Cholesterol, Total: 156 mg/dL (ref 100–199)
HDL: 36 mg/dL — ABNORMAL LOW (ref >39)
LDL Chol Calc (NIH): 84 mg/dL (ref 0–99)
Triglycerides: 211 mg/dL — ABNORMAL HIGH (ref 0–149)
VLDL Cholesterol Cal: 36 mg/dL (ref 5–40)

## 2022-04-21 LAB — TSH: TSH: 1.6 u[IU]/mL (ref 0.450–4.500)

## 2022-04-21 LAB — HEMOGLOBIN A1C
Est. average glucose Bld gHb Est-mCnc: 206 mg/dL
Hgb A1c MFr Bld: 8.8 % — ABNORMAL HIGH (ref 4.8–5.6)

## 2022-04-22 ENCOUNTER — Ambulatory Visit: Payer: BC Managed Care – PPO | Admitting: Family Medicine

## 2022-04-22 ENCOUNTER — Encounter: Payer: Self-pay | Admitting: Family Medicine

## 2022-04-22 VITALS — BP 124/84 | HR 79 | Ht 62.0 in | Wt 128.0 lb

## 2022-04-22 DIAGNOSIS — E785 Hyperlipidemia, unspecified: Secondary | ICD-10-CM

## 2022-04-22 DIAGNOSIS — F411 Generalized anxiety disorder: Secondary | ICD-10-CM

## 2022-04-22 DIAGNOSIS — Z23 Encounter for immunization: Secondary | ICD-10-CM

## 2022-04-22 DIAGNOSIS — E1165 Type 2 diabetes mellitus with hyperglycemia: Secondary | ICD-10-CM | POA: Diagnosis not present

## 2022-04-22 DIAGNOSIS — Z794 Long term (current) use of insulin: Secondary | ICD-10-CM

## 2022-04-22 DIAGNOSIS — Z1231 Encounter for screening mammogram for malignant neoplasm of breast: Secondary | ICD-10-CM

## 2022-04-22 DIAGNOSIS — I10 Essential (primary) hypertension: Secondary | ICD-10-CM

## 2022-04-22 MED ORDER — AMLODIPINE BESYLATE 10 MG PO TABS: 10.0000 mg | ORAL_TABLET | Freq: Every day | ORAL | 5 refills | Status: DC

## 2022-04-22 MED ORDER — BUSPIRONE HCL 5 MG PO TABS: 5.0000 mg | ORAL_TABLET | Freq: Three times a day (TID) | ORAL | 2 refills | Status: DC

## 2022-04-22 MED ORDER — FENOFIBRATE 145 MG PO TABS: 145.0000 mg | ORAL_TABLET | Freq: Every day | ORAL | 5 refills | Status: DC

## 2022-04-22 MED ORDER — LOSARTAN POTASSIUM-HCTZ 50-12.5 MG PO TABS: 1.0000 | ORAL_TABLET | Freq: Every day | ORAL | 5 refills | Status: DC

## 2022-04-22 MED ORDER — ROSUVASTATIN CALCIUM 40 MG PO TABS: 40.0000 mg | ORAL_TABLET | Freq: Every day | ORAL | 5 refills | Status: DC

## 2022-04-22 MED ORDER — GLIPIZIDE ER 10 MG PO TB24: ORAL_TABLET | ORAL | 1 refills | Status: DC

## 2022-04-22 NOTE — Patient Instructions (Signed)
Follow-up in 8 to 10 weeks reevaluate anxiety and chronic medical conditions, call if you need me sooner.  Good foot exam today.  Tdap and flu vaccine today in the office. Take oTC vit D3, 2000 IU daily I do recommend that you get the current COVID-vaccine in the next 1 to 2 weeks.  Fenofibrate is added to your rosuvastatin as triglycerides are high.  Do start taking 50 units of Lantus every day and checking your blood sugar at least once daily preferably twice daily.  You are referred to diabetic educator please keep the appointment.(Nurse please refer, I will sign)  New for anxiety is BuSpar 5 mg 3 times daily.  Yes I do recommend that you see a fertility specialist because of your age in particular and as we discussed it is very important that you get your blood sugar controlled for a good outcome with pregnancy.  Thanks for choosing [facility], we consider it a privelige to serve you.

## 2022-04-25 NOTE — Assessment & Plan Note (Signed)
Uncontrolled , start buspar 

## 2022-04-25 NOTE — Assessment & Plan Note (Signed)
Controlled, no change in medication DASH diet and commitment to daily physical activity for a minimum of 30 minutes discussed and encouraged, as a part of hypertension management. The importance of attaining a healthy weight is also discussed.     04/22/2022    9:33 AM 04/22/2022    9:08 AM 04/22/2022    8:42 AM 04/12/2022    2:20 PM 10/21/2021    3:40 PM 08/18/2021    4:28 PM 03/10/2021    1:24 PM  BP/Weight  Systolic BP 124 144 154 139 122 149 120  Diastolic BP 84 84 84 87 84 91 80  Wt. (Lbs)   128 126.7 130.6 128.12 126  BMI   23.41 kg/m2 23.17 kg/m2 23.89 kg/m2 25.02 kg/m2 24.61 kg/m2

## 2022-04-25 NOTE — Assessment & Plan Note (Signed)
[name] is reminded of the importance of commitment to daily physical activity for 30 minutes or more, as able and the need to limit carbohydrate intake to 30 to 60 grams per meal to help with blood sugar control.   The need to take medication as prescribed, test blood sugar as directed, and to call between visits if there is a concern that blood sugar is uncontrolled is also discussed.   [name] is reminded of the importance of daily foot exam, annual eye examination, and good blood sugar, blood pressure and cholesterol control.     Latest Ref Rng & Units 04/20/2022    8:35 AM 08/19/2021    8:31 AM 08/17/2021    9:03 AM 08/25/2020    5:11 PM 07/01/2020    8:05 AM  Diabetic Labs  HbA1c 4.8 - 5.6 % 8.8   10.5  8.3    Micro/Creat Ratio 0 - 29 mg/g creat  94      Chol 100 - 199 mg/dL 093   235   573   HDL >22 mg/dL 36   43   37   Calc LDL 0 - 99 mg/dL 84   025   427   Triglycerides 0 - 149 mg/dL 062   376   283   Creatinine 0.57 - 1.00 mg/dL 1.51   7.61         60/73/7106    9:33 AM 04/22/2022    9:08 AM 04/22/2022    8:42 AM 04/12/2022    2:20 PM 10/21/2021    3:40 PM 08/18/2021    4:28 PM 03/10/2021    1:24 PM  BP/Weight  Systolic BP 124 144 154 139 122 149 120  Diastolic BP 84 84 84 87 84 91 80  Wt. (Lbs)   128 126.7 130.6 128.12 126  BMI   23.41 kg/m2 23.17 kg/m2 23.89 kg/m2 25.02 kg/m2 24.61 kg/m2      Latest Ref Rng & Units 11/24/2021   12:00 AM 08/25/2020    3:20 PM  Foot/eye exam completion dates  Eye Exam No Retinopathy No Retinopathy       Foot Form Completion   Done     This result is from an external source.      Increase lantus, and f/u with endo

## 2022-04-25 NOTE — Progress Notes (Signed)
[name] Fernandez     MRN: [id number]      DOB: August 04, 1975   HPI [name] Fernandez is here for follow up and re-evaluation of chronic medical conditions, medication management and review of any available recent lab and radiology data.  Preventive health is updated, specifically  Cancer screening and Immunization.   Has appt to f/u with Endo for uncontrolled diabetes. C/o increased and uncontrolled anxiety ROS Denies recent fever or chills. Denies sinus pressure, nasal congestion, ear pain or sore throat. Denies chest congestion, productive cough or wheezing. Denies chest pains, palpitations and leg swelling Denies abdominal pain, nausea, vomiting,diarrhea or constipation.   Denies dysuria, frequency, hesitancy or incontinence. Denies joint pain, swelling and limitation in mobility. Denies headaches, seizures, numbness, or tingling. . Denies skin break down or rash.   PE  BP 124/84   Pulse 79   Ht 5\' 2"  (1.575 m)   Wt 128 lb (58.1 kg)   LMP 04/07/2022   SpO2 98%   BMI 23.41 kg/m    Patient alert and oriented and in no cardiopulmonary distress.  HEENT: No facial asymmetry, EOMI,     Neck supple .  Chest: Clear to auscultation bilaterally.  CVS: S1, S2 no murmurs, no S3.Regular rate.  ABD: Soft non tender.   Ext: No edema  MS: Adequate ROM spine, shoulders, hips and knees.  Skin: Intact, no ulcerations or rash noted.  Psych: Good eye contact, normal affect. Memory intact not anxious or depressed appearing.  CNS: CN 2-12 intact, power,  normal throughout.no focal deficits noted.   Assessment & Plan  Type 2 diabetes mellitus with hyperglycemia (HCC) [name] Fernandez is reminded of the importance of commitment to daily physical activity for 30 minutes or more, as able and the need to limit carbohydrate intake to 30 to 60 grams per meal to help with blood sugar control.   The need to take medication as prescribed, test blood sugar as directed, and to call between visits if  there is a concern that blood sugar is uncontrolled is also discussed.   [name] Fernandez is reminded of the importance of daily foot exam, annual eye examination, and good blood sugar, blood pressure and cholesterol control.     Latest Ref Rng & Units 04/20/2022    8:35 AM 08/19/2021    8:31 AM 08/17/2021    9:03 AM 08/25/2020    5:11 PM 07/01/2020    8:05 AM  Diabetic Labs  HbA1c 4.8 - 5.6 % 8.8   10.5  8.3    Micro/Creat Ratio 0 - 29 mg/g creat  94      Chol 100 - 199 mg/dL 07/03/2020   626   948   HDL 546 mg/dL 36   43   37   Calc LDL 0 - 99 mg/dL 84   >27   035   Triglycerides 0 - 149 mg/dL 009   381   829   Creatinine 0.57 - 1.00 mg/dL 937   1.69         6.78    9:33 AM 04/22/2022    9:08 AM 04/22/2022    8:42 AM 04/12/2022    2:20 PM 10/21/2021    3:40 PM 08/18/2021    4:28 PM 03/10/2021    1:24 PM  BP/Weight  Systolic BP 124 144 154 139 122 149 120  Diastolic BP 84 84 84 87 84 91 80  Wt. (Lbs)   128 126.7 130.6 128.12 126  BMI  23.41 kg/m2 23.17 kg/m2 23.89 kg/m2 25.02 kg/m2 24.61 kg/m2      Latest Ref Rng & Units 11/24/2021   12:00 AM 08/25/2020    3:20 PM  Foot/eye exam completion dates  Eye Exam No Retinopathy No Retinopathy       Foot Form Completion   Done     This result is from an external source.      Increase lantus, and f/u with endo   Hyperlipidemia LDL goal <100 Hyperlipidemia:Low fat diet discussed and encouraged.   Lipid Panel  Lab Results  Component Value Date   CHOL 156 04/20/2022   HDL 36 (L) 04/20/2022   LDLCALC 84 04/20/2022   TRIG 211 (H) 04/20/2022   CHOLHDL 4.3 04/20/2022     Rduce fat intake and add fenofibrate  GAD (generalized anxiety disorder) Uncontrolled , start buspar  Essential hypertension Controlled, no change in medication DASH diet and commitment to daily physical activity for a minimum of 30 minutes discussed and encouraged, as a part of hypertension management. The importance of attaining a healthy weight is also  discussed.     04/22/2022    9:33 AM 04/22/2022    9:08 AM 04/22/2022    8:42 AM 04/12/2022    2:20 PM 10/21/2021    3:40 PM 08/18/2021    4:28 PM 03/10/2021    1:24 PM  BP/Weight  Systolic BP 124 144 154 139 122 149 120  Diastolic BP 84 84 84 87 84 91 80  Wt. (Lbs)   128 126.7 130.6 128.12 126  BMI   23.41 kg/m2 23.17 kg/m2 23.89 kg/m2 25.02 kg/m2 24.61 kg/m2

## 2022-04-25 NOTE — Assessment & Plan Note (Signed)
Hyperlipidemia:Low fat diet discussed and encouraged.   Lipid Panel  Lab Results  Component Value Date   CHOL 156 04/20/2022   HDL 36 (L) 04/20/2022   LDLCALC 84 04/20/2022   TRIG 211 (H) 04/20/2022   CHOLHDL 4.3 04/20/2022     Rduce fat intake and add fenofibrate

## 2022-05-07 ENCOUNTER — Ambulatory Visit (HOSPITAL_COMMUNITY)
Admission: RE | Admit: 2022-05-07 | Discharge: 2022-05-07 | Disposition: A | Discharge status: Home / Self Care | Payer: BC Managed Care – PPO | Source: Ambulatory Visit | Attending: Family Medicine | Admitting: Family Medicine

## 2022-05-07 DIAGNOSIS — Z1231 Encounter for screening mammogram for malignant neoplasm of breast: Secondary | ICD-10-CM | POA: Insufficient documentation

## 2022-06-01 NOTE — Patient Instructions (Incomplete)

## 2022-06-02 ENCOUNTER — Ambulatory Visit: Payer: BC Managed Care – PPO | Admitting: Nurse Practitioner

## 2022-06-20 ENCOUNTER — Emergency Department (HOSPITAL_COMMUNITY): Payer: BC Managed Care – PPO

## 2022-06-20 ENCOUNTER — Other Ambulatory Visit: Payer: Self-pay

## 2022-06-20 ENCOUNTER — Encounter (HOSPITAL_COMMUNITY): Payer: Self-pay | Admitting: Emergency Medicine

## 2022-06-20 ENCOUNTER — Emergency Department (HOSPITAL_COMMUNITY)
Admission: EM | Admit: 2022-06-20 | Discharge: 2022-06-20 | Disposition: A | Discharge status: Home / Self Care | Payer: BC Managed Care – PPO | Attending: Emergency Medicine | Admitting: Emergency Medicine

## 2022-06-20 DIAGNOSIS — Y92410 Unspecified street and highway as the place of occurrence of the external cause: Secondary | ICD-10-CM | POA: Diagnosis not present

## 2022-06-20 DIAGNOSIS — Z79899 Other long term (current) drug therapy: Secondary | ICD-10-CM | POA: Diagnosis not present

## 2022-06-20 DIAGNOSIS — Z7984 Long term (current) use of oral hypoglycemic drugs: Secondary | ICD-10-CM | POA: Insufficient documentation

## 2022-06-20 DIAGNOSIS — I10 Essential (primary) hypertension: Secondary | ICD-10-CM | POA: Diagnosis not present

## 2022-06-20 DIAGNOSIS — Z794 Long term (current) use of insulin: Secondary | ICD-10-CM | POA: Insufficient documentation

## 2022-06-20 DIAGNOSIS — R0781 Pleurodynia: Secondary | ICD-10-CM | POA: Diagnosis present

## 2022-06-20 DIAGNOSIS — E119 Type 2 diabetes mellitus without complications: Secondary | ICD-10-CM | POA: Insufficient documentation

## 2022-06-20 DIAGNOSIS — S20212A Contusion of left front wall of thorax, initial encounter: Secondary | ICD-10-CM | POA: Diagnosis not present

## 2022-06-20 MED ORDER — HYDROCODONE-ACETAMINOPHEN 5-325 MG PO TABS: 1.0000 | ORAL_TABLET | Freq: Four times a day (QID) | ORAL | 0 refills | Status: DC | PRN

## 2022-06-20 MED ORDER — NAPROXEN 500 MG PO TABS: 500.0000 mg | ORAL_TABLET | Freq: Two times a day (BID) | ORAL | 0 refills | Status: DC

## 2022-06-20 MED ORDER — KETOROLAC TROMETHAMINE 60 MG/2ML IM SOLN
60.0000 mg | Freq: Once | INTRAMUSCULAR | Status: AC
  Administered 2022-06-20: 60 mg via INTRAMUSCULAR
  Filled 2022-06-20: qty 2

## 2022-06-20 NOTE — ED Triage Notes (Signed)
Pt states she fell last night and has had L rib pain since.

## 2022-06-20 NOTE — Discharge Instructions (Signed)
Begin taking naproxen as prescribed.  Begin taking hydrocodone as prescribed as needed for pain unrelieved with naproxen.  Follow-up with primary doctor if not improving in the next week.

## 2022-06-20 NOTE — ED Provider Notes (Signed)
[facility] Provider Note   CSN: [id number] Arrival date & time: 06/20/22  [id number]     History  Chief Complaint  Patient presents with   L. Rib Pain    [name] Fernandez is a 47 y.o. female.  Patient is a 47 year old female with past medical history of diabetes, hypertension, hyperlipidemia.  Patient presenting today for evaluation of a left rib injury.  Patient was getting into her car this afternoon when she reports falling and landing on her left chest.  She has had severe pain to the ribs since.  She describes pain when she breathes but denies shortness of breath.  There are no alleviating factors.  The history is provided by the patient.       Home Medications Prior to Admission medications   Medication Sig Start Date End Date Taking? Authorizing Provider  amLODipine (NORVASC) 10 MG tablet Take 1 tablet (10 mg total) by mouth daily. 04/22/22   Fayrene Helper, MD  busPIRone (BUSPAR) 5 MG tablet Take 1 tablet (5 mg total) by mouth 3 (three) times daily. 04/22/22   Fayrene Helper, MD  Calcium Magnesium Zinc 333-133-5 MG TABS Take by mouth.    [provider]  Cholecalciferol (VITAMIN D3) 50 MCG (2000 UT) capsule Take 1 capsule (2,000 Units total) by mouth daily. 04/22/22   Fayrene Helper, MD  cyanocobalamin (VITAMIN B12) 1000 MCG tablet Take 1,000 mcg by mouth daily.    [provider]  fenofibrate (TRICOR) 145 MG tablet Take 1 tablet (145 mg total) by mouth daily. 04/22/22   Fayrene Helper, MD  fexofenadine-pseudoephedrine (ALLEGRA-D 24) 180-240 MG 24 hr tablet Take 1 tablet by mouth daily.    [provider]  folic acid (FOLVITE) Q000111Q MCG tablet Take 400 mcg by mouth daily.    [provider]  glipiZIDE (GLUCOTROL XL) 10 MG 24 hr tablet Take two tablets by mouth with breakfast every morning 04/22/22   Fayrene Helper, MD  insulin glargine (LANTUS) 100 UNIT/ML injection INJECT 50  UNITS INTO THE SKIN ONCE DAILY 1 refill only until appt 04/05/22   Fayrene Helper, MD  losartan-hydrochlorothiazide (HYZAAR) 50-12.5 MG tablet Take 1 tablet by mouth daily. 04/22/22   Fayrene Helper, MD  Multiple Vitamin (MULTIVITAMIN) capsule Take 1 capsule by mouth daily.    [provider]  Prenatal Vit-Fe Fumarate-FA (PRENATAL MULTIVITAMIN) TABS tablet Take 1 tablet by mouth daily at 12 noon.    [provider]  rosuvastatin (CRESTOR) 40 MG tablet Take 1 tablet (40 mg total) by mouth daily. 04/22/22   Fayrene Helper, MD      Allergies    Janumet [sitagliptin-metformin hcl] and Lisinopril    Review of Systems   Review of Systems  All other systems reviewed and are negative.   Physical Exam Updated Vital Signs BP (!) 189/96   Pulse 94   Temp 99.3 F (37.4 C) (Oral)   Resp 20   Ht 5' 2"$  (1.575 m)   Wt 57.2 kg   LMP 06/09/2022   SpO2 100%   BMI 23.05 kg/m  Physical Exam Vitals and nursing note reviewed.  Constitutional:      Appearance: Normal appearance.  HENT:     Head: Normocephalic and atraumatic.  Cardiovascular:     Rate and Rhythm: Normal rate and regular rhythm.     Heart sounds: No murmur heard. Pulmonary:     Effort: Pulmonary effort is normal.  No respiratory distress.     Breath sounds: No wheezing or rhonchi.     Comments: There is tenderness to palpation over the left lateral ribs.  There is no palpable abnormality or crepitus.  Breath sounds are equal bilaterally. Chest:     Chest wall: Tenderness present.  Skin:    General: Skin is warm and dry.  Neurological:     Mental Status: She is alert.     ED Results / Procedures / Treatments   Labs (all labs ordered are listed, but only abnormal results are displayed) Labs Reviewed - No data to display  EKG None  Radiology No results found.  Procedures Procedures    Medications Ordered in ED Medications - No data to display  ED Course/ Medical Decision Making/  A&P  Patient presenting with left-sided chest wall pain after a fall this evening.  X-rays are negative for fracture or pneumothorax, but patient complaining of severe pain.  He will be given IM Toradol here in the ER and discharged with naproxen and a small quantity of hydrocodone.  Final Clinical Impression(s) / ED Diagnoses Final diagnoses:  None    Rx / DC Orders ED Discharge Orders     None         Veryl Speak, MD 06/20/22 [phone]

## 2022-06-21 ENCOUNTER — Telehealth: Payer: Self-pay | Admitting: Family Medicine

## 2022-06-21 MED FILL — Hydrocodone-Acetaminophen Tab 5-325 MG: ORAL | Qty: 6 | Status: AC

## 2022-06-21 NOTE — Telephone Encounter (Signed)
Transition Care Management Follow-up Telephone Call Date of discharge and from where: 06/20/22 [facility]  How have you been since you were released from the hospital? Good  Any questions or concerns? No  Items Reviewed: Did the pt receive and understand the discharge instructions provided? Yes  Medications obtained and verified? Yes  Other? No  Any new allergies since your discharge? No  Dietary orders reviewed? Yes Do you have support at home? Yes   Home Care and Equipment/Supplies: Were home health services ordered? no If so, what is the name of the agency? na  Has the agency set up a time to come to the patient's home? not applicable Were any new equipment or medical supplies ordered?  No What is the name of the medical supply agency? na Were you able to get the supplies/equipment? not applicable Do you have any questions related to the use of the equipment or supplies? No  Functional Questionnaire: (I = Independent and D = Dependent) ADLs: i  Bathing/Dressing- i  Meal Prep- i  Eating- i  Maintaining continence- i  Transferring/Ambulation- i  Managing Meds- i  Follow up appointments reviewed:  PCP Hospital f/u appt confirmed? Yes  Scheduled to see Moshe Cipro on @ 8:40am. [facility] f/u appt confirmed? No   Are transportation arrangements needed? No  If their condition worsens, is the pt aware to call PCP or go to the Emergency Dept.? Yes Was the patient provided with contact information for the PCP's office or ED? Yes Was to pt encouraged to call back with questions or concerns? Yes

## 2022-06-23 ENCOUNTER — Encounter: Payer: Self-pay | Admitting: Family Medicine

## 2022-06-23 ENCOUNTER — Ambulatory Visit: Payer: BC Managed Care – PPO | Admitting: Family Medicine

## 2022-06-23 VITALS — BP 180/82 | HR 82 | Ht 60.0 in | Wt 122.0 lb

## 2022-06-23 DIAGNOSIS — I10 Essential (primary) hypertension: Secondary | ICD-10-CM | POA: Diagnosis not present

## 2022-06-23 DIAGNOSIS — E1165 Type 2 diabetes mellitus with hyperglycemia: Secondary | ICD-10-CM | POA: Diagnosis not present

## 2022-06-23 DIAGNOSIS — F322 Major depressive disorder, single episode, severe without psychotic features: Secondary | ICD-10-CM

## 2022-06-23 DIAGNOSIS — Z09 Encounter for follow-up examination after completed treatment for conditions other than malignant neoplasm: Secondary | ICD-10-CM

## 2022-06-23 DIAGNOSIS — F411 Generalized anxiety disorder: Secondary | ICD-10-CM | POA: Diagnosis not present

## 2022-06-23 DIAGNOSIS — Z794 Long term (current) use of insulin: Secondary | ICD-10-CM

## 2022-06-23 MED ORDER — FLUOXETINE HCL 20 MG PO TABS: 20.0000 mg | ORAL_TABLET | Freq: Every day | ORAL | 3 refills | Status: DC

## 2022-06-23 MED ORDER — BUSPIRONE HCL 5 MG PO TABS: 5.0000 mg | ORAL_TABLET | Freq: Two times a day (BID) | ORAL | 3 refills | Status: DC
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
## 2022-06-23 NOTE — Patient Instructions (Addendum)
Follow-up March 15 or shortly after.  Call if you need me sooner.   Work excuse from  February 19 to return February 26.  Nurse BP check Feb 22   New medication for depression fluoxetine 20 mg daily and for anxiety BuSpar 5 mg twice daily.    You are being referred to psychiatry as well as to  a therapist.  Please get labs ordered on this on March 12 so that they are available before your next appointment.  Blood pressure is very high today, it is important that you take your medications daily these are amlodipine and losartan HCTZ.  Thanks for choosing [facility], we consider it a privelige to serve you.

## 2022-06-24 ENCOUNTER — Other Ambulatory Visit: Payer: Self-pay

## 2022-06-24 ENCOUNTER — Telehealth: Payer: Self-pay | Admitting: Family Medicine

## 2022-06-24 DIAGNOSIS — I10 Essential (primary) hypertension: Secondary | ICD-10-CM

## 2022-06-24 DIAGNOSIS — Z794 Long term (current) use of insulin: Secondary | ICD-10-CM

## 2022-06-24 DIAGNOSIS — E785 Hyperlipidemia, unspecified: Secondary | ICD-10-CM

## 2022-06-24 MED ORDER — FENOFIBRATE 145 MG PO TABS: 145.0000 mg | ORAL_TABLET | Freq: Every day | ORAL | 5 refills | Status: DC

## 2022-06-24 MED ORDER — AMLODIPINE BESYLATE 10 MG PO TABS: 10.0000 mg | ORAL_TABLET | Freq: Every day | ORAL | 5 refills | Status: DC

## 2022-06-24 MED ORDER — GLIPIZIDE ER 10 MG PO TB24: ORAL_TABLET | ORAL | 1 refills | Status: DC

## 2022-06-24 NOTE — Telephone Encounter (Signed)
Patient called was seen yesterday and need refills. Patient asking for the lantus pen   amLODipine (NORVASC) 10 MG tablet [id number]   Cholecalciferol (VITAMIN D3) 50 MCG (2000 UT) capsule [id number]   fenofibrate (TRICOR) 145 MG tablet [id number]  + glipiZIDE (GLUCOTROL XL) 10 MG 24 hr tablet [id number]     Pharmacy: [facility]

## 2022-06-25 ENCOUNTER — Other Ambulatory Visit: Payer: Self-pay

## 2022-06-25 ENCOUNTER — Telehealth: Payer: Self-pay | Admitting: Family Medicine

## 2022-06-25 MED ORDER — LANTUS SOLOSTAR 100 UNIT/ML ~~LOC~~ SOPN: 50.0000 [IU] | PEN_INJECTOR | Freq: Every day | SUBCUTANEOUS | 3 refills | Status: DC

## 2022-06-25 NOTE — Telephone Encounter (Signed)
Patient aware.

## 2022-06-25 NOTE — Telephone Encounter (Signed)
Patient called in regard to insulin glargine (LANTUS) 100 UNIT/ML injection   Wants to have script switched to Lantus Pen.  Patient wants a call back in regard     [place], [place] - [address] [place], [place] [address] Phone: [phone]  Fax: [phone]

## 2022-06-28 DIAGNOSIS — Z09 Encounter for follow-up examination after completed treatment for conditions other than malignant neoplasm: Secondary | ICD-10-CM

## 2022-06-28 HISTORY — DX: Encounter for follow-up examination after completed treatment for conditions other than malignant neoplasm: Z09

## 2022-06-28 MED ORDER — VITAMIN D3 50 MCG (2000 UT) PO CAPS: 2000.0000 [IU] | ORAL_CAPSULE | Freq: Every day | ORAL | 1 refills | Status: AC

## 2022-06-28 NOTE — Assessment & Plan Note (Signed)
Fluoxetine and buspar prescribed , urgent Psych referral , will also refer to therapist Not suicidal or homicidal

## 2022-06-28 NOTE — Assessment & Plan Note (Signed)
Patient in for follow up of recent Ed visit Discharge summary, and laboratory and radiology data are reviewed, and any questions or concerns  are discussed. Specific issues requiring follow up are specifically addressed.

## 2022-06-28 NOTE — Addendum Note (Signed)
Addended by: Tula Nakayama E on: 06/28/2022 03:09 AM   Modules accepted: Orders

## 2022-06-28 NOTE — Assessment & Plan Note (Signed)
Uncontrolled , need sto re establish with Endo as previously agreed Ms. Chhabra is reminded of the importance of commitment to daily physical activity for 30 minutes or more, as able and the need to limit carbohydrate intake to 30 to 60 grams per meal to help with blood sugar control.   The need to take medication as prescribed, test blood sugar as directed, and to call between visits if there is a concern that blood sugar is uncontrolled is also discussed.   Ms. [place] is reminded of the importance of daily foot exam, annual eye examination, and good blood sugar, blood pressure and cholesterol control.     Latest Ref Rng & Units 04/20/2022    8:35 AM 08/19/2021    8:31 AM 08/17/2021    9:03 AM 08/25/2020    5:11 PM 07/01/2020    8:05 AM  Diabetic Labs  HbA1c 4.8 - 5.6 % 8.8   10.5  8.3    Micro/Creat Ratio 0 - 29 mg/g creat  94      Chol 100 - 199 mg/dL 156   195   203   HDL >39 mg/dL 36   43   37   Calc LDL 0 - 99 mg/dL 84   113   139   Triglycerides 0 - 149 mg/dL 211   225   149   Creatinine 0.57 - 1.00 mg/dL 0.88   0.84         06/23/2022    8:44 AM 06/23/2022    8:41 AM 06/20/2022    2:30 AM 04/22/2022    9:33 AM 04/22/2022    9:08 AM 04/22/2022    8:42 AM 04/12/2022    2:20 PM  BP/Weight  Systolic BP [id number] 99991111 A999333 123456 123456 [id number]  Diastolic BP 82 95 96 84 84 84 87  Wt. (Lbs)  122.04 126   128 126.7  BMI  23.83 kg/m2 23.05 kg/m2   23.41 kg/m2 23.17 kg/m2      Latest Ref Rng & Units 11/24/2021   12:00 AM 08/25/2020    3:20 PM  Foot/eye exam completion dates  Eye Exam No Retinopathy No Retinopathy       Foot Form Completion   Done     This result is from an external source.

## 2022-06-28 NOTE — Assessment & Plan Note (Signed)
Uncontorlled , needs meds DASH diet and commitment to daily physical activity for a minimum of 30 minutes discussed and encouraged, as a part of hypertension management. The importance of attaining a healthy weight is also discussed.     06/23/2022    8:44 AM 06/23/2022    8:41 AM 06/20/2022    2:30 AM 04/22/2022    9:33 AM 04/22/2022    9:08 AM 04/22/2022    8:42 AM 04/12/2022    2:20 PM  BP/Weight  Systolic BP 99991111 [id number] 99991111 A999333 123456 123456 [id number]  Diastolic BP 82 95 96 84 84 84 87  Wt. (Lbs)  122.04 126   128 126.7  BMI  23.83 kg/m2 23.05 kg/m2   23.41 kg/m2 23.17 kg/m2     Nurse B P check in 2 weeks

## 2022-06-28 NOTE — Assessment & Plan Note (Signed)
Start buspar and refer to Psych and therapist

## 2022-06-29 ENCOUNTER — Encounter: Payer: Self-pay | Admitting: Family Medicine

## 2022-06-29 NOTE — Addendum Note (Signed)
Addended by: Fayrene Helper on: 06/29/2022 10:42 PM   Modules accepted: Orders

## 2022-07-02 ENCOUNTER — Ambulatory Visit: Payer: BC Managed Care – PPO

## 2022-07-08 ENCOUNTER — Other Ambulatory Visit: Payer: Self-pay

## 2022-07-08 MED ORDER — ROSUVASTATIN CALCIUM 40 MG PO TABS: 40.0000 mg | ORAL_TABLET | Freq: Every day | ORAL | 5 refills | Status: DC

## 2022-07-08 MED ORDER — VALACYCLOVIR HCL 1 G PO TABS: 1000.0000 mg | ORAL_TABLET | Freq: Every day | ORAL | 2 refills | Status: AC

## 2022-07-21 ENCOUNTER — Ambulatory Visit (HOSPITAL_COMMUNITY): Payer: BC Managed Care – PPO | Admitting: Psychiatry

## 2022-07-22 ENCOUNTER — Ambulatory Visit: Payer: BC Managed Care – PPO | Admitting: Family Medicine

## 2022-07-22 ENCOUNTER — Other Ambulatory Visit: Payer: Self-pay

## 2022-07-22 ENCOUNTER — Other Ambulatory Visit (HOSPITAL_COMMUNITY)
Admission: RE | Admit: 2022-07-22 | Discharge: 2022-07-22 | Disposition: A | Discharge status: Home / Self Care | Payer: BC Managed Care – PPO | Source: Ambulatory Visit | Attending: Family Medicine | Admitting: Family Medicine

## 2022-07-22 ENCOUNTER — Encounter: Payer: Self-pay | Admitting: Family Medicine

## 2022-07-22 VITALS — BP 164/88 | HR 76 | Ht 60.0 in | Wt 120.1 lb

## 2022-07-22 DIAGNOSIS — I10 Essential (primary) hypertension: Secondary | ICD-10-CM | POA: Diagnosis not present

## 2022-07-22 DIAGNOSIS — Z113 Encounter for screening for infections with a predominantly sexual mode of transmission: Secondary | ICD-10-CM | POA: Insufficient documentation

## 2022-07-22 DIAGNOSIS — E1165 Type 2 diabetes mellitus with hyperglycemia: Secondary | ICD-10-CM | POA: Diagnosis not present

## 2022-07-22 DIAGNOSIS — F322 Major depressive disorder, single episode, severe without psychotic features: Secondary | ICD-10-CM

## 2022-07-22 DIAGNOSIS — Z794 Long term (current) use of insulin: Secondary | ICD-10-CM

## 2022-07-22 MED ORDER — LOSARTAN POTASSIUM-HCTZ 50-12.5 MG PO TABS: 1.0000 | ORAL_TABLET | Freq: Every day | ORAL | 5 refills | Status: DC

## 2022-07-22 NOTE — Patient Instructions (Addendum)
F/U April 23 or after re eval blood pressure, call if you need me sooner\   NEED to take both BP meds  Please sched appt with Psych, thankful you  are back in therapy  Wet prep and culture today, HSV 2 , RPR and HIV testing today   Urine ACR amd chem 7 and EGFr non fasting April 13 or after  Thanks for choosing [facility], we consider it a privelige to serve you.

## 2022-07-23 LAB — CERVICOVAGINAL ANCILLARY ONLY
Bacterial Vaginitis (gardnerella): NEGATIVE
Candida Glabrata: NEGATIVE
Candida Vaginitis: NEGATIVE
Chlamydia: NEGATIVE
Comment: NEGATIVE
Comment: NEGATIVE
Comment: NEGATIVE
Comment: NEGATIVE
Comment: NEGATIVE
Comment: NORMAL
Neisseria Gonorrhea: NEGATIVE
Trichomonas: NEGATIVE

## 2022-07-23 LAB — HIV ANTIBODY (ROUTINE TESTING W REFLEX): HIV Screen 4th Generation wRfx: NONREACTIVE

## 2022-07-23 LAB — HSV-2 AB, IGG: HSV 2 IgG, Type Spec: 3.05 index — ABNORMAL HIGH (ref 0.00–0.90)

## 2022-07-23 LAB — RPR: RPR Ser Ql: NONREACTIVE

## 2022-07-25 ENCOUNTER — Encounter: Payer: Self-pay | Admitting: Family Medicine

## 2022-07-25 DIAGNOSIS — Z113 Encounter for screening for infections with a predominantly sexual mode of transmission: Secondary | ICD-10-CM | POA: Insufficient documentation

## 2022-07-25 HISTORY — DX: Encounter for screening for infections with a predominantly sexual mode of transmission: Z11.3

## 2022-07-25 NOTE — Assessment & Plan Note (Signed)
Pelvic exam as documented, specimens sent for testing

## 2022-07-25 NOTE — Assessment & Plan Note (Signed)
Uncontrolled , non compliant with treatment plan states has not yet colloected med DASH diet and commitment to daily physical activity for a minimum of 30 minutes discussed and encouraged, as a part of hypertension management. The importance of attaining a healthy weight is also discussed.     07/22/2022    9:23 AM 07/22/2022    9:13 AM 07/22/2022    9:11 AM 07/02/2022    1:32 PM 06/23/2022    8:44 AM 06/23/2022    8:41 AM 06/20/2022    2:30 AM  BP/Weight  Systolic BP 123456 0000000 [id number] XX123456 99991111  Diastolic BP 88 84 99 78 82 95 96  Wt. (Lbs)   120.08   122.04 126  BMI   23.45 kg/m2   23.83 kg/m2 23.05 kg/m2

## 2022-07-25 NOTE — Assessment & Plan Note (Signed)
Uncontrolled , referred too Endo, non compliant , need sto schedule appt [name] Fernandez is reminded of the importance of commitment to daily physical activity for 30 minutes or more, as able and the need to limit carbohydrate intake to 30 to 60 grams per meal to help with blood sugar control.   The need to take medication as prescribed, test blood sugar as directed, and to call between visits if there is a concern that blood sugar is uncontrolled is also discussed.   [name] Fernandez is reminded of the importance of daily foot exam, annual eye examination, and good blood sugar, blood pressure and cholesterol control.     Latest Ref Rng & Units 04/20/2022    8:35 AM 08/19/2021    8:31 AM 08/17/2021    9:03 AM 08/25/2020    5:11 PM 07/01/2020    8:05 AM  Diabetic Labs  HbA1c 4.8 - 5.6 % 8.8   10.5  8.3    Micro/Creat Ratio 0 - 29 mg/g creat  94      Chol 100 - 199 mg/dL 156   195   203   HDL >39 mg/dL 36   43   37   Calc LDL 0 - 99 mg/dL 84   113   139   Triglycerides 0 - 149 mg/dL 211   225   149   Creatinine 0.57 - 1.00 mg/dL 0.88   0.84         07/22/2022    9:23 AM 07/22/2022    9:13 AM 07/22/2022    9:11 AM 07/02/2022    1:32 PM 06/23/2022    8:44 AM 06/23/2022    8:41 AM 06/20/2022    2:30 AM  BP/Weight  Systolic BP 123456 0000000 [id number] 99991111  Diastolic BP 88 84 99 78 82 95 96  Wt. (Lbs)   120.08   122.04 126  BMI   23.45 kg/m2   23.83 kg/m2 23.05 kg/m2      Latest Ref Rng & Units 11/24/2021   12:00 AM 08/25/2020    3:20 PM  Foot/eye exam completion dates  Eye Exam No Retinopathy No Retinopathy       Foot Form Completion   Done     This result is from an external source.

## 2022-07-25 NOTE — Assessment & Plan Note (Signed)
Reports marked improvement since leaving partner, needs Psych management, unstable mood with risky decision making as far as her personal health is concerned, she is aware and agrees that she does need treatment

## 2022-07-25 NOTE — Progress Notes (Signed)
[name] Fernandez     MRN: [id number]      DOB: 1976-01-06   HPI [name] Fernandez is here for follow up and re-evaluation of chronic medical conditions, medication management and review of any available recent lab and radiology data.  Preventive health is updated, specifically  Cancer screening and Immunization.   The PT denies any adverse reactions to current medications since the last visit.  Requests STD testing States has left partner and is living with her daughter temporarily Has spoken with Therapist once Not taking any medication prescribed at last visit, no antihypertensive or anti depressant med, plans to keep appt with psych  ROS Denies recent fever or chills. Denies sinus pressure, nasal congestion, ear pain or sore throat. Denies chest congestion, productive cough or wheezing. Denies chest pains, palpitations and leg swelling Denies abdominal pain, nausea, vomiting,diarrhea or constipation.   Denies dysuria, frequency, hesitancy or incontinence. Denies joint pain, swelling and limitation in mobility. Denies headaches, seizures, numbness, or tingling. Denies uncontrolled  depression, anxiety or insomnia. Denies skin break down or rash.   PE  BP (!) 164/88   Pulse 76   Ht 5' (1.524 m)   Wt 120 lb 1.3 oz (54.5 kg)   SpO2 96%   BMI 23.45 kg/m   Patient alert and oriented and in no cardiopulmonary distress.  HEENT: No facial asymmetry, EOMI,     Neck supple .  Chest: Clear to auscultation bilaterally.  CVS: S1, S2 no murmurs, no S3.Regular rate.  ABD: Soft non tender.  Pelvic: Normal ext genitalia, no cervical motion  or adnexal tenderness, white vaginal d/c appears physiologic, no adnexal mass , uterus normal sized  Ext: No edema  MS: Adequate ROM spine, shoulders, hips and knees.  Skin: Intact, no ulcerations or rash noted.  Psych: Good eye contact, normal affect. Memory intact not anxious or depressed appearing.  CNS: CN 2-12 intact, power,  normal  throughout.no focal deficits noted.   Assessment & Plan  Essential hypertension Uncontrolled , non compliant with treatment plan states has not yet colloected med DASH diet and commitment to daily physical activity for a minimum of 30 minutes discussed and encouraged, as a part of hypertension management. The importance of attaining a healthy weight is also discussed.     07/22/2022    9:23 AM 07/22/2022    9:13 AM 07/22/2022    9:11 AM 07/02/2022    1:32 PM 06/23/2022    8:44 AM 06/23/2022    8:41 AM 06/20/2022    2:30 AM  BP/Weight  Systolic BP 123456 0000000 [id number] [id number]  Diastolic BP 88 84 99 78 82 95 96  Wt. (Lbs)   120.08   122.04 126  BMI   23.45 kg/m2   23.83 kg/m2 23.05 kg/m2       Type 2 diabetes mellitus with hyperglycemia (HCC) Uncontrolled , referred too Endo, non compliant , need sto schedule appt [name] Fernandez is reminded of the importance of commitment to daily physical activity for 30 minutes or more, as able and the need to limit carbohydrate intake to 30 to 60 grams per meal to help with blood sugar control.   The need to take medication as prescribed, test blood sugar as directed, and to call between visits if there is a concern that blood sugar is uncontrolled is also discussed.   [name] Fernandez is reminded of the importance of daily foot exam, annual eye examination, and good blood sugar, blood pressure and cholesterol  control.     Latest Ref Rng & Units 04/20/2022    8:35 AM 08/19/2021    8:31 AM 08/17/2021    9:03 AM 08/25/2020    5:11 PM 07/01/2020    8:05 AM  Diabetic Labs  HbA1c 4.8 - 5.6 % 8.8   10.5  8.3    Micro/Creat Ratio 0 - 29 mg/g creat  94      Chol 100 - 199 mg/dL 156   195   203   HDL >39 mg/dL 36   43   37   Calc LDL 0 - 99 mg/dL 84   113   139   Triglycerides 0 - 149 mg/dL 211   225   149   Creatinine 0.57 - 1.00 mg/dL 0.88   0.84         07/22/2022    9:23 AM 07/22/2022    9:13 AM 07/22/2022    9:11 AM 07/02/2022    1:32 PM 06/23/2022     8:44 AM 06/23/2022    8:41 AM 06/20/2022    2:30 AM  BP/Weight  Systolic BP 123456 0000000 [id number]  Diastolic BP 88 84 99 78 82 95 96  Wt. (Lbs)   120.08   122.04 126  BMI   23.45 kg/m2   23.83 kg/m2 23.05 kg/m2      Latest Ref Rng & Units 11/24/2021   12:00 AM 08/25/2020    3:20 PM  Foot/eye exam completion dates  Eye Exam No Retinopathy No Retinopathy       Foot Form Completion   Done     This result is from an external source.        Depression, major, single episode, severe ([place]) Reports marked improvement since leaving partner, needs Psych management, unstable mood with risky decision making as far as her personal health is concerned, she is aware and agrees that she does need treatment  Screening examination for STD (sexually transmitted disease) Pelvic exam as documented, specimens sent for testing

## 2022-07-27 MED ORDER — ACYCLOVIR 400 MG PO TABS: 400.0000 mg | ORAL_TABLET | Freq: Two times a day (BID) | ORAL | 5 refills | Status: AC

## 2022-08-03 ENCOUNTER — Encounter (HOSPITAL_COMMUNITY): Payer: Self-pay | Admitting: Psychiatry

## 2022-08-03 ENCOUNTER — Ambulatory Visit (INDEPENDENT_AMBULATORY_CARE_PROVIDER_SITE_OTHER): Payer: BC Managed Care – PPO | Admitting: Psychiatry

## 2022-08-03 DIAGNOSIS — Z6981 Encounter for mental health services for victim of other abuse: Secondary | ICD-10-CM

## 2022-08-03 DIAGNOSIS — F410 Panic disorder [episodic paroxysmal anxiety] without agoraphobia: Secondary | ICD-10-CM | POA: Diagnosis not present

## 2022-08-03 DIAGNOSIS — F411 Generalized anxiety disorder: Secondary | ICD-10-CM | POA: Diagnosis not present

## 2022-08-03 DIAGNOSIS — G4700 Insomnia, unspecified: Secondary | ICD-10-CM | POA: Insufficient documentation

## 2022-08-03 DIAGNOSIS — F4310 Post-traumatic stress disorder, unspecified: Secondary | ICD-10-CM | POA: Diagnosis not present

## 2022-08-03 DIAGNOSIS — F1290 Cannabis use, unspecified, uncomplicated: Secondary | ICD-10-CM

## 2022-08-03 DIAGNOSIS — F331 Major depressive disorder, recurrent, moderate: Secondary | ICD-10-CM

## 2022-08-03 DIAGNOSIS — G4709 Other insomnia: Secondary | ICD-10-CM

## 2022-08-03 NOTE — Patient Instructions (Signed)
We changed the BuSpar to be 2.5 mg each morning and 5 mg at night.  Hopefully this will change the need for cannabis at night as this is likely making her sleep less restorative and worsening her anxiety and depression.  Do your best to try and cut back and remain abstinent from it.  I will coordinate with Dr. Moshe Cipro to recheck some of those vitamin levels like we discussed and see if he can get a nutrition referral.  Keep your appointment with Peggy and that should help with house you have been feeling as well.  If you desire further medication consultation feel free to contact us mildly happy to see you again.
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
## 2022-08-19 ENCOUNTER — Ambulatory Visit (HOSPITAL_COMMUNITY): Payer: BC Managed Care – PPO | Admitting: Psychiatry

## 2022-08-19 ENCOUNTER — Telehealth (HOSPITAL_COMMUNITY): Payer: Self-pay | Admitting: Psychiatry

## 2022-08-19 NOTE — Telephone Encounter (Signed)
Therapist attempted to contact patient via phone regarding scheduled in office appointment and received voicemail message.  Therapist left message indicating attempt and requesting patient call office. 

## 2022-09-09 ENCOUNTER — Ambulatory Visit: Payer: BC Managed Care – PPO | Admitting: Family Medicine

## 2022-11-22 ENCOUNTER — Other Ambulatory Visit: Payer: Self-pay | Admitting: Family Medicine

## 2022-11-22 DIAGNOSIS — E1165 Type 2 diabetes mellitus with hyperglycemia: Secondary | ICD-10-CM

## 2022-12-02 NOTE — Telephone Encounter (Signed)
Pt calling; when does she need to come back in for a pap smear?  [phone]  Pt aware to come in next summer for annual and pap smear.  Pt asked if she could get the gardasil; she has HPV.  Adv she has aged out.

## 2022-12-22 ENCOUNTER — Telehealth: Payer: Self-pay | Admitting: Family Medicine

## 2022-12-22 ENCOUNTER — Encounter: Payer: Self-pay | Admitting: Family Medicine

## 2022-12-22 ENCOUNTER — Ambulatory Visit: Payer: BC Managed Care – PPO | Admitting: Family Medicine

## 2022-12-22 VITALS — BP 133/81 | HR 86 | Ht 60.0 in | Wt 120.0 lb

## 2022-12-22 DIAGNOSIS — Z7251 High risk heterosexual behavior: Secondary | ICD-10-CM

## 2022-12-22 DIAGNOSIS — I10 Essential (primary) hypertension: Secondary | ICD-10-CM | POA: Diagnosis not present

## 2022-12-22 DIAGNOSIS — E785 Hyperlipidemia, unspecified: Secondary | ICD-10-CM | POA: Diagnosis not present

## 2022-12-22 DIAGNOSIS — E1165 Type 2 diabetes mellitus with hyperglycemia: Secondary | ICD-10-CM

## 2022-12-22 DIAGNOSIS — Z113 Encounter for screening for infections with a predominantly sexual mode of transmission: Secondary | ICD-10-CM

## 2022-12-22 DIAGNOSIS — F4310 Post-traumatic stress disorder, unspecified: Secondary | ICD-10-CM

## 2022-12-22 DIAGNOSIS — E538 Deficiency of other specified B group vitamins: Secondary | ICD-10-CM

## 2022-12-22 DIAGNOSIS — Z794 Long term (current) use of insulin: Secondary | ICD-10-CM

## 2022-12-22 LAB — HM DIABETES EYE EXAM

## 2022-12-22 MED ORDER — FLUCONAZOLE 150 MG PO TABS: 150.0000 mg | ORAL_TABLET | Freq: Once | ORAL | 2 refills | Status: AC

## 2022-12-22 MED ORDER — ONDANSETRON HCL 4 MG PO TABS: 4.0000 mg | ORAL_TABLET | Freq: Three times a day (TID) | ORAL | 0 refills | Status: AC | PRN

## 2022-12-22 NOTE — Telephone Encounter (Signed)
Patient coming at 1:20.

## 2022-12-22 NOTE — Patient Instructions (Addendum)
Annual exam in 3  months, call if you need me sooner  Nurse please test and document  temperature , unsure if she has a fever  Labs today CBC and diff, cmp and EGFR, HBA1C, lipid panel, HIV, RPR, B12 BC, chlamydia and GC  Pregnancy test in office , I need to beinformed if positive before she leaves as medication has been prescribed for her today  Fluconazole is prescribed  Blood pressure is good  Zofran is prescribed short term for nausea  Work excuse for today only  Thanks for choosing [facility], we consider it a privelige to serve you.

## 2022-12-22 NOTE — Telephone Encounter (Signed)
Patient called asking if Dr Lodema Hong would order some blood work to be done, patient requesting STD , HIV and the 3 main orders. Patient would like a call back once this has been ordered so she can come in and have it done.

## 2022-12-22 NOTE — Progress Notes (Signed)
[name] Fernandez     MRN: [id number]      DOB: May 17, 1975  Chief Complaint  Patient presents with   Follow-up    Follow up requesting routine labs and std labs, swelling near eye    HPI [name] Fernandez is here with a 2 day h/o not feeling well , unable to sleep x 2 days, nausea, feels down generally, lost her only living aunt unexpectedly on 11/22/2022. Felt hot outside,  sweat, no known fever or chills Has been taking flagyll intermittently for 3 weeks c/o vaginal itch New relationship in past 6 week, wants testing, for STD, no specific symptoms Period was late this month for the first time uncertain of pregnancy status unsure if she is  spotting now   ROS Denies recent fever or chills. Denies sinus pressure, nasal congestion, ear pain or sore throat. Denies chest congestion, productive cough or wheezing. Denies chest pains, palpitations and leg swelling Denies abdominal pain, nausea, vomiting,diarrhea or constipation.   Denies dysuria, frequency, hesitancy or incontinence. Denies joint pain, swelling and limitation in mobility. Denies headaches, seizures, numbness, or tingling. Denies skin break down or rash.   PE  BP 133/81 (BP Location: Right Arm, Patient Position: Sitting, Cuff Size: Normal)   Pulse 86   Ht 5' (1.524 m)   Wt 120 lb 0.6 oz (54.4 kg)   SpO2 96%   BMI 23.44 kg/m   Patient alert and oriented and in no cardiopulmonary distress.  HEENT: No facial asymmetry, EOMI,     Neck supple .  Chest: Clear to auscultation bilaterally.  CVS: S1, S2 no murmurs, no S3.Regular rate.  ABD: Soft non tender.   Ext: No edema  MS: Adequate ROM spine, shoulders, hips and knees.  Skin: Intact, no ulcerations or rash noted.  Psych: Good eye contact, normal affect. Memory intact mildly  anxious not depressed appearing.  CNS: CN 2-12 intact, power,  normal throughout.no focal deficits noted.   Assessment & Plan  Essential hypertension Controlled, no change in  medication DASH diet and commitment to daily physical activity for a minimum of 30 minutes discussed and encouraged, as a part of hypertension management. The importance of attaining a healthy weight is also discussed.     12/22/2022    1:40 PM 07/22/2022    9:23 AM 07/22/2022    9:13 AM 07/22/2022    9:11 AM 07/02/2022    1:32 PM 06/23/2022    8:44 AM 06/23/2022    8:41 AM  BP/Weight  Systolic BP 133 164 152 165 115 180 183  Diastolic BP 81 88 84 99 78 82 95  Wt. (Lbs) 120.04   120.08   122.04  BMI 23.44 kg/m2   23.45 kg/m2   23.83 kg/m2       Type 2 diabetes mellitus with hyperglycemia (HCC) [name] Fernandez is reminded of the importance of commitment to daily physical activity for 30 minutes or more, as able and the need to limit carbohydrate intake to 30 to 60 grams per meal to help with blood sugar control.   The need to take medication as prescribed, test blood sugar as directed, and to call between visits if there is a concern that blood sugar is uncontrolled is also discussed.   [name] Fernandez is reminded of the importance of daily foot exam, annual eye examination, and good blood sugar, blood pressure and cholesterol control. Improving but still uncontrolled , inc lantus to 60 units daily, needs to f/u with Endo and is aware  Latest Ref Rng & Units 12/23/2022    8:52 AM 04/20/2022    8:35 AM 08/19/2021    8:31 AM 08/17/2021    9:03 AM 08/25/2020    5:11 PM  Diabetic Labs  HbA1c 4.8 - 5.6 % 8.3  8.8   10.5  8.3   Micro/Creat Ratio 0 - 29 mg/g creat   94     Chol 100 - 199 mg/dL 161  096   045    HDL >40 mg/dL 38  36   43    Calc LDL 0 - 99 mg/dL 981  84   191    Triglycerides 0 - 149 mg/dL 84  478   295    Creatinine 0.57 - 1.00 mg/dL 6.21  3.08   6.57        12/22/2022    1:40 PM 07/22/2022    9:23 AM 07/22/2022    9:13 AM 07/22/2022    9:11 AM 07/02/2022    1:32 PM 06/23/2022    8:44 AM 06/23/2022    8:41 AM  BP/Weight  Systolic BP 133 164 152 165 115 180 183  Diastolic  BP 81 88 84 99 78 82 95  Wt. (Lbs) 120.04   120.08   122.04  BMI 23.44 kg/m2   23.45 kg/m2   23.83 kg/m2      Latest Ref Rng & Units 11/24/2021   12:00 AM 08/25/2020    3:20 PM  Foot/eye exam completion dates  Eye Exam No Retinopathy No Retinopathy       Foot Form Completion   Done     This result is from an external source.        Hyperlipidemia LDL goal <100 Hyperlipidemia:Low fat diet discussed and encouraged.   Lipid Panel  Lab Results  Component Value Date   CHOL 158 12/23/2022   HDL 38 (L) 12/23/2022   LDLCALC 104 (H) 12/23/2022   TRIG 84 12/23/2022   CHOLHDL 4.2 12/23/2022     Needs to lower fat intake, not at goal  High risk sexual behavior Requests STD testing as in new relationship, tests ordered and  encouraged to use condoms to reduce STD exposure  PTSD (post-traumatic stress disorder) Managed by Psych  B12 deficiency Updated lab needed

## 2022-12-24 ENCOUNTER — Encounter: Payer: Self-pay | Admitting: Family Medicine

## 2022-12-24 LAB — CBC WITH DIFFERENTIAL/PLATELET
Basophils Absolute: 0.1 10*3/uL (ref 0.0–0.2)
Basos: 1 %
EOS (ABSOLUTE): 0.1 10*3/uL (ref 0.0–0.4)
Eos: 1 %
Hematocrit: 38.6 % (ref 34.0–46.6)
Hemoglobin: 12.9 g/dL (ref 11.1–15.9)
Immature Grans (Abs): 0 10*3/uL (ref 0.0–0.1)
Immature Granulocytes: 0 %
Lymphocytes Absolute: 2 10*3/uL (ref 0.7–3.1)
Lymphs: 20 %
MCH: 27.1 pg (ref 26.6–33.0)
MCHC: 33.4 g/dL (ref 31.5–35.7)
MCV: 81 fL (ref 79–97)
Monocytes Absolute: 1 10*3/uL — ABNORMAL HIGH (ref 0.1–0.9)
Monocytes: 11 %
Neutrophils Absolute: 6.5 10*3/uL (ref 1.4–7.0)
Neutrophils: 67 %
Platelets: 394 10*3/uL (ref 150–450)
RBC: 4.76 x10E6/uL (ref 3.77–5.28)
RDW: 12.4 % (ref 11.7–15.4)
WBC: 9.6 10*3/uL (ref 3.4–10.8)

## 2022-12-24 LAB — CMP14+EGFR
ALT: 14 IU/L (ref 0–32)
AST: 14 IU/L (ref 0–40)
Albumin: 4.3 g/dL (ref 3.9–4.9)
Alkaline Phosphatase: 63 IU/L (ref 44–121)
BUN/Creatinine Ratio: 12 (ref 9–23)
BUN: 13 mg/dL (ref 6–24)
Bilirubin Total: <0.2 mg/dL (ref 0.0–1.2)
CO2: 26 mmol/L (ref 20–29)
Calcium: 9.8 mg/dL (ref 8.7–10.2)
Chloride: 98 mmol/L (ref 96–106)
Creatinine, Ser: 1.11 mg/dL — ABNORMAL HIGH (ref 0.57–1.00)
Globulin, Total: 3.2 g/dL (ref 1.5–4.5)
Glucose: 127 mg/dL — ABNORMAL HIGH (ref 70–99)
Potassium: 4.3 mmol/L (ref 3.5–5.2)
Sodium: 138 mmol/L (ref 134–144)
Total Protein: 7.5 g/dL (ref 6.0–8.5)
eGFR: 63 mL/min/{1.73_m2} (ref >59)

## 2022-12-24 LAB — LIPID PANEL
Chol/HDL Ratio: 4.2 ratio (ref 0.0–4.4)
Cholesterol, Total: 158 mg/dL (ref 100–199)
HDL: 38 mg/dL — ABNORMAL LOW (ref >39)
LDL Chol Calc (NIH): 104 mg/dL — ABNORMAL HIGH (ref 0–99)
Triglycerides: 84 mg/dL (ref 0–149)
VLDL Cholesterol Cal: 16 mg/dL (ref 5–40)

## 2022-12-24 LAB — VITAMIN B12: Vitamin B-12: 847 pg/mL (ref 232–1245)

## 2022-12-24 LAB — HEMOGLOBIN A1C
Est. average glucose Bld gHb Est-mCnc: 192 mg/dL
Hgb A1c MFr Bld: 8.3 % — ABNORMAL HIGH (ref 4.8–5.6)

## 2022-12-24 LAB — HIV ANTIBODY (ROUTINE TESTING W REFLEX): HIV Screen 4th Generation wRfx: NONREACTIVE

## 2022-12-24 LAB — RPR: RPR Ser Ql: NONREACTIVE

## 2022-12-25 MED ORDER — LANTUS SOLOSTAR 100 UNIT/ML ~~LOC~~ SOPN: PEN_INJECTOR | SUBCUTANEOUS | 99 refills | Status: DC

## 2022-12-26 ENCOUNTER — Encounter: Payer: Self-pay | Admitting: Family Medicine

## 2022-12-26 DIAGNOSIS — E538 Deficiency of other specified B group vitamins: Secondary | ICD-10-CM | POA: Insufficient documentation

## 2022-12-26 NOTE — Assessment & Plan Note (Signed)
Hyperlipidemia:Low fat diet discussed and encouraged.   Lipid Panel  Lab Results  Component Value Date   CHOL 158 12/23/2022   HDL 38 (L) 12/23/2022   LDLCALC 104 (H) 12/23/2022   TRIG 84 12/23/2022   CHOLHDL 4.2 12/23/2022     Needs to lower fat intake, not at goal

## 2022-12-26 NOTE — Assessment & Plan Note (Signed)
Requests STD testing as in new relationship, tests ordered and  encouraged to use condoms to reduce STD exposure

## 2022-12-26 NOTE — Assessment & Plan Note (Signed)
Controlled, no change in medication DASH diet and commitment to daily physical activity for a minimum of 30 minutes discussed and encouraged, as a part of hypertension management. The importance of attaining a healthy weight is also discussed.     12/22/2022    1:40 PM 07/22/2022    9:23 AM 07/22/2022    9:13 AM 07/22/2022    9:11 AM 07/02/2022    1:32 PM 06/23/2022    8:44 AM 06/23/2022    8:41 AM  BP/Weight  Systolic BP 133 164 152 165 115 180 183  Diastolic BP 81 88 84 99 78 82 95  Wt. (Lbs) 120.04   120.08   122.04  BMI 23.44 kg/m2   23.45 kg/m2   23.83 kg/m2

## 2022-12-26 NOTE — Assessment & Plan Note (Signed)
Managed by Psych

## 2022-12-26 NOTE — Assessment & Plan Note (Signed)
Ms. Kempel is reminded of the importance of commitment to daily physical activity for 30 minutes or more, as able and the need to limit carbohydrate intake to 30 to 60 grams per meal to help with blood sugar control.   The need to take medication as prescribed, test blood sugar as directed, and to call between visits if there is a concern that blood sugar is uncontrolled is also discussed.   Ms. Liakos is reminded of the importance of daily foot exam, annual eye examination, and good blood sugar, blood pressure and cholesterol control. Improving but still uncontrolled , inc lantus to 60 units daily, needs to f/u with Endo and is aware     Latest Ref Rng & Units 12/23/2022    8:52 AM 04/20/2022    8:35 AM 08/19/2021    8:31 AM 08/17/2021    9:03 AM 08/25/2020    5:11 PM  Diabetic Labs  HbA1c 4.8 - 5.6 % 8.3  8.8   10.5  8.3   Micro/Creat Ratio 0 - 29 mg/g creat   94     Chol 100 - 199 mg/dL 102  725   366    HDL >44 mg/dL 38  36   43    Calc LDL 0 - 99 mg/dL 034  84   742    Triglycerides 0 - 149 mg/dL 84  595   638    Creatinine 0.57 - 1.00 mg/dL 7.56  4.33   2.95        12/22/2022    1:40 PM 07/22/2022    9:23 AM 07/22/2022    9:13 AM 07/22/2022    9:11 AM 07/02/2022    1:32 PM 06/23/2022    8:44 AM 06/23/2022    8:41 AM  BP/Weight  Systolic BP 133 164 152 165 115 180 183  Diastolic BP 81 88 84 99 78 82 95  Wt. (Lbs) 120.04   120.08   122.04  BMI 23.44 kg/m2   23.45 kg/m2   23.83 kg/m2      Latest Ref Rng & Units 11/24/2021   12:00 AM 08/25/2020    3:20 PM  Foot/eye exam completion dates  Eye Exam No Retinopathy No Retinopathy       Foot Form Completion   Done     This result is from an external source.

## 2022-12-26 NOTE — Assessment & Plan Note (Signed)
Updated lab needed.  

## 2022-12-27 ENCOUNTER — Telehealth: Payer: Self-pay | Admitting: Family Medicine

## 2022-12-27 NOTE — Telephone Encounter (Addendum)
Patient called in LVM, wants call back with urine sample results.  Patient also was not tested for Trichomonas and wanted to be tested for that as well

## 2022-12-28 ENCOUNTER — Other Ambulatory Visit (HOSPITAL_COMMUNITY)
Admission: RE | Admit: 2022-12-28 | Discharge: 2022-12-28 | Disposition: A | Discharge status: Home / Self Care | Payer: BC Managed Care – PPO | Source: Ambulatory Visit | Attending: Family Medicine | Admitting: Family Medicine

## 2022-12-28 ENCOUNTER — Other Ambulatory Visit: Payer: Self-pay

## 2022-12-28 ENCOUNTER — Other Ambulatory Visit: Payer: Self-pay | Admitting: Family Medicine

## 2022-12-28 DIAGNOSIS — Z113 Encounter for screening for infections with a predominantly sexual mode of transmission: Secondary | ICD-10-CM | POA: Diagnosis present

## 2022-12-28 DIAGNOSIS — E1165 Type 2 diabetes mellitus with hyperglycemia: Secondary | ICD-10-CM

## 2022-12-29 NOTE — Telephone Encounter (Signed)
Patient aware.

## 2022-12-30 LAB — CERVICOVAGINAL ANCILLARY ONLY
Bacterial Vaginitis (gardnerella): NEGATIVE
Candida Glabrata: NEGATIVE
Candida Vaginitis: NEGATIVE
Chlamydia: NEGATIVE
Comment: NEGATIVE
Comment: NEGATIVE
Comment: NEGATIVE
Comment: NEGATIVE
Comment: NEGATIVE
Comment: NORMAL
Neisseria Gonorrhea: NEGATIVE
Trichomonas: NEGATIVE

## 2022-12-30 LAB — MICROALBUMIN / CREATININE URINE RATIO
Creatinine, Urine: 12.9 mg/dL
Microalb/Creat Ratio: <23 mg/g{creat} (ref 0–29)
Microalbumin, Urine: <3 ug/mL

## 2022-12-31 NOTE — Telephone Encounter (Signed)
Patient aware of lab results.

## 2022-12-31 NOTE — Addendum Note (Signed)
Addended by: Kerri Perches on: 12/31/2022 07:12 AM   Modules accepted: Orders

## 2023-01-24 ENCOUNTER — Telehealth (HOSPITAL_COMMUNITY): Payer: Self-pay

## 2023-01-24 NOTE — Telephone Encounter (Signed)
01/26/23 appt with peggy confirmed

## 2023-01-26 ENCOUNTER — Ambulatory Visit (HOSPITAL_COMMUNITY): Payer: BC Managed Care – PPO | Admitting: Psychiatry

## 2023-02-18 ENCOUNTER — Ambulatory Visit: Payer: BC Managed Care – PPO | Admitting: Family Medicine

## 2023-02-23 ENCOUNTER — Other Ambulatory Visit: Payer: Self-pay | Admitting: Family Medicine

## 2023-02-23 DIAGNOSIS — I10 Essential (primary) hypertension: Secondary | ICD-10-CM

## 2023-03-24 ENCOUNTER — Encounter: Payer: BC Managed Care – PPO | Admitting: Family Medicine

## 2023-04-06 ENCOUNTER — Telehealth (HOSPITAL_COMMUNITY): Payer: Self-pay

## 2023-04-06 NOTE — Telephone Encounter (Signed)
Lvm to confirm 04/11/23 appt

## 2023-04-11 ENCOUNTER — Ambulatory Visit (HOSPITAL_COMMUNITY): Payer: Self-pay | Admitting: Psychiatry

## 2023-04-21 ENCOUNTER — Ambulatory Visit: Payer: BC Managed Care – PPO | Admitting: "Endocrinology

## 2023-05-24 ENCOUNTER — Other Ambulatory Visit: Payer: Self-pay | Admitting: Family Medicine

## 2023-06-17 ENCOUNTER — Ambulatory Visit (INDEPENDENT_AMBULATORY_CARE_PROVIDER_SITE_OTHER): Payer: PRIVATE HEALTH INSURANCE | Admitting: Family Medicine

## 2023-06-17 ENCOUNTER — Encounter: Payer: Self-pay | Admitting: Family Medicine

## 2023-06-17 VITALS — BP 124/62 | HR 88 | Resp 16 | Ht 60.0 in | Wt 120.0 lb

## 2023-06-17 DIAGNOSIS — E1165 Type 2 diabetes mellitus with hyperglycemia: Secondary | ICD-10-CM

## 2023-06-17 DIAGNOSIS — Z1231 Encounter for screening mammogram for malignant neoplasm of breast: Secondary | ICD-10-CM

## 2023-06-17 DIAGNOSIS — R87810 Cervical high risk human papillomavirus (HPV) DNA test positive: Secondary | ICD-10-CM

## 2023-06-17 DIAGNOSIS — E785 Hyperlipidemia, unspecified: Secondary | ICD-10-CM | POA: Diagnosis not present

## 2023-06-17 DIAGNOSIS — Z23 Encounter for immunization: Secondary | ICD-10-CM | POA: Diagnosis not present

## 2023-06-17 DIAGNOSIS — F331 Major depressive disorder, recurrent, moderate: Secondary | ICD-10-CM

## 2023-06-17 DIAGNOSIS — Z794 Long term (current) use of insulin: Secondary | ICD-10-CM

## 2023-06-17 MED ORDER — FLUCONAZOLE 150 MG PO TABS: 150.0000 mg | ORAL_TABLET | Freq: Once | ORAL | 5 refills | Status: AC

## 2023-06-17 MED ORDER — LOSARTAN POTASSIUM-HCTZ 50-12.5 MG PO TABS: 1.0000 | ORAL_TABLET | Freq: Every day | ORAL | 5 refills | Status: DC

## 2023-06-17 MED ORDER — FLUOXETINE HCL 10 MG PO TABS: 10.0000 mg | ORAL_TABLET | Freq: Every day | ORAL | 3 refills | Status: AC

## 2023-06-17 MED ORDER — GLIPIZIDE ER 10 MG PO TB24: ORAL_TABLET | ORAL | 0 refills | Status: DC

## 2023-06-17 NOTE — Patient Instructions (Addendum)
 F/U in  4 months call if you need me sooner  Labs today HBA1C, cmp and eGFr, lipid panel  Fluconazole  and fluoxetine  are prescribed as new  You need appt with Endo, pls make one  Pls schedule mammogram at checkout  Group sessions are available for grief  counselling as we discussed, check them out, they will  help, [facility]  Thanks for choosing [facility], we consider it a privelige to serve you.

## 2023-06-18 LAB — CMP14+EGFR
ALT: 12 [IU]/L (ref 0–32)
AST: 17 [IU]/L (ref 0–40)
Albumin: 4.3 g/dL (ref 3.9–4.9)
Alkaline Phosphatase: 60 [IU]/L (ref 44–121)
BUN/Creatinine Ratio: 11 (ref 9–23)
BUN: 11 mg/dL (ref 6–24)
Bilirubin Total: 0.3 mg/dL (ref 0.0–1.2)
CO2: 22 mmol/L (ref 20–29)
Calcium: 10 mg/dL (ref 8.7–10.2)
Chloride: 99 mmol/L (ref 96–106)
Creatinine, Ser: 0.99 mg/dL (ref 0.57–1.00)
Globulin, Total: 3.5 g/dL (ref 1.5–4.5)
Glucose: 79 mg/dL (ref 70–99)
Potassium: 3.8 mmol/L (ref 3.5–5.2)
Sodium: 139 mmol/L (ref 134–144)
Total Protein: 7.8 g/dL (ref 6.0–8.5)
eGFR: 72 mL/min/{1.73_m2} (ref >59)

## 2023-06-18 LAB — LIPID PANEL
Chol/HDL Ratio: 4.3 {ratio} (ref 0.0–4.4)
Cholesterol, Total: 159 mg/dL (ref 100–199)
HDL: 37 mg/dL — ABNORMAL LOW (ref >39)
LDL Chol Calc (NIH): 102 mg/dL — ABNORMAL HIGH (ref 0–99)
Triglycerides: 108 mg/dL (ref 0–149)
VLDL Cholesterol Cal: 20 mg/dL (ref 5–40)

## 2023-06-18 LAB — HEMOGLOBIN A1C
Est. average glucose Bld gHb Est-mCnc: 177 mg/dL
Hgb A1c MFr Bld: 7.8 % — ABNORMAL HIGH (ref 4.8–5.6)

## 2023-06-19 ENCOUNTER — Encounter: Payer: Self-pay | Admitting: Family Medicine

## 2023-06-19 ENCOUNTER — Other Ambulatory Visit: Payer: Self-pay | Admitting: Family Medicine

## 2023-06-19 DIAGNOSIS — Z23 Encounter for immunization: Secondary | ICD-10-CM | POA: Insufficient documentation

## 2023-06-19 DIAGNOSIS — Z1231 Encounter for screening mammogram for malignant neoplasm of breast: Secondary | ICD-10-CM | POA: Insufficient documentation

## 2023-06-19 NOTE — Assessment & Plan Note (Signed)
 Diabetes associated with hypertension, hyperlipidemia, and depression  [name] Fernandez is reminded of the importance of commitment to daily physical activity for 30 minutes or more, as able and the need to limit carbohydrate intake to 30 to 60 grams per meal to help with blood sugar control.  Improved but still not adequately controlled , needs to re establish with Endo  The need to take medication as prescribed, test blood sugar as directed, and to call between visits if there is a concern that blood sugar is uncontrolled is also discussed.   [name] Fernandez is reminded of the importance of daily foot exam, annual eye examination, and good blood sugar, blood pressure and cholesterol control.     Latest Ref Rng & Units 06/17/2023   10:07 AM 12/28/2022    5:05 PM 12/23/2022    8:52 AM 04/20/2022    8:35 AM 08/19/2021    8:31 AM  Diabetic Labs  HbA1c 4.8 - 5.6 % 7.8   8.3  8.8    Micro/Creat Ratio 0 - 29 mg/g creat  <23    94   Chol 100 - 199 mg/dL 840   841  843    HDL >60 mg/dL 37   38  36    Calc LDL 0 - 99 mg/dL 897   895  84    Triglycerides 0 - 149 mg/dL 891   84  788    Creatinine 0.57 - 1.00 mg/dL 9.00   8.88  9.11        06/17/2023    9:23 AM 06/17/2023    9:04 AM 12/22/2022    1:40 PM 07/22/2022    9:23 AM 07/22/2022    9:13 AM 07/22/2022    9:11 AM 07/02/2022    1:32 PM  BP/Weight  Systolic BP 124 146 133 164 152 165 115  Diastolic BP 62 82 81 88 84 99 78  Wt. (Lbs)  120 120.04   120.08   BMI  23.44 kg/m2 23.44 kg/m2   23.45 kg/m2       Latest Ref Rng & Units 06/17/2023    8:40 AM 12/22/2022   12:00 AM  Foot/eye exam completion dates  Eye Exam No Retinopathy  No Retinopathy  C     Foot Form Completion  Done     C Corrected result   This result is from an external source.

## 2023-06-19 NOTE — Assessment & Plan Note (Signed)
 Start low dose fluoxetine  and recommend grief sessions

## 2023-06-19 NOTE — Assessment & Plan Note (Signed)
 Hyperlipidemia:Low fat diet discussed and encouraged.   Lipid Panel  Lab Results  Component Value Date   CHOL 159 06/17/2023   HDL 37 (L) 06/17/2023   LDLCALC 102 (H) 06/17/2023   TRIG 108 06/17/2023   CHOLHDL 4.3 06/17/2023     Need to verify if taking any meds currently , will need low dose crestor  if she has not been taking any med, will send a message

## 2023-06-19 NOTE — Assessment & Plan Note (Signed)
 Referral entered

## 2023-06-19 NOTE — Assessment & Plan Note (Signed)
 After obtaining informed consent, the influenza vaccine is  administered , with no adverse effect noted at the time of administration.

## 2023-06-19 NOTE — Assessment & Plan Note (Signed)
Will f/u with Gyne

## 2023-06-19 NOTE — Progress Notes (Signed)
 [name] Fernandez     MRN: [id number]      DOB: 02-Nov-1975  Chief Complaint  Patient presents with   Follow-up    HTN and DM. Requests a rx for fluconazole  for when she needs it     HPI [name] Fernandez is here for follow up and re-evaluation of chronic medical conditions, medication management and review of any available recent lab and radiology data.  Preventive health is updated, specifically  Cancer screening and Immunization.   Questions or concerns regarding consultations or procedures which the PT has had in the interim are  addressed.Needs to re establish with Endo was between jobs and out of ins New job less stressful but grieving after several losses in past 1 year of family members who were supportive and who she was close to Reports poor appetite she thinks due to grief Has been self adjusting insulin  based on oral intake which is currently poor  ROS Denies recent fever or chills. Denies sinus pressure, nasal congestion, ear pain or sore throat. Denies chest congestion, productive cough or wheezing. Denies chest pains, palpitations and leg swelling Denies abdominal pain, nausea, vomiting,diarrhea or constipation.   Denies dysuria, frequency, hesitancy or incontinence. Denies joint pain, swelling and limitation in mobility. Denies headaches, seizures, numbness, or tingling. Denies skin break down or rash.   PE  BP 124/62   Pulse 88   Resp 16   Ht 5' (1.524 m)   Wt 120 lb (54.4 kg)   SpO2 97%   BMI 23.44 kg/m   Patient alert and oriented and in no cardiopulmonary distress.  HEENT: No facial asymmetry, EOMI,     Neck supple .  Chest: Clear to auscultation bilaterally.  CVS: S1, S2 no murmurs, no S3.Regular rate.  ABD: Soft non tender.   Ext: No edema  MS: Adequate ROM spine, shoulders, hips and knees.  Skin: Intact, no ulcerations or rash noted.  Psych: Good eye contact, normal affect. Memory intact not anxious or depressed appearing.  CNS: CN 2-12  intact, power,  normal throughout.no focal deficits noted.   Assessment & Plan  Type 2 diabetes mellitus with hyperglycemia (HCC) Diabetes associated with hypertension, hyperlipidemia, and depression  [name] Fernandez is reminded of the importance of commitment to daily physical activity for 30 minutes or more, as able and the need to limit carbohydrate intake to 30 to 60 grams per meal to help with blood sugar control.  Improved but still not adequately controlled , needs to re establish with Endo  The need to take medication as prescribed, test blood sugar as directed, and to call between visits if there is a concern that blood sugar is uncontrolled is also discussed.   [name] Fernandez is reminded of the importance of daily foot exam, annual eye examination, and good blood sugar, blood pressure and cholesterol control.     Latest Ref Rng & Units 06/17/2023   10:07 AM 12/28/2022    5:05 PM 12/23/2022    8:52 AM 04/20/2022    8:35 AM 08/19/2021    8:31 AM  Diabetic Labs  HbA1c 4.8 - 5.6 % 7.8   8.3  8.8    Micro/Creat Ratio 0 - 29 mg/g creat  <23    94   Chol 100 - 199 mg/dL 840   841  843    HDL >60 mg/dL 37   38  36    Calc LDL 0 - 99 mg/dL 897   895  84  Triglycerides 0 - 149 mg/dL 891   84  788    Creatinine 0.57 - 1.00 mg/dL 9.00   8.88  9.11        06/17/2023    9:23 AM 06/17/2023    9:04 AM 12/22/2022    1:40 PM 07/22/2022    9:23 AM 07/22/2022    9:13 AM 07/22/2022    9:11 AM 07/02/2022    1:32 PM  BP/Weight  Systolic BP 124 146 133 164 152 165 115  Diastolic BP 62 82 81 88 84 99 78  Wt. (Lbs)  120 120.04   120.08   BMI  23.44 kg/m2 23.44 kg/m2   23.45 kg/m2       Latest Ref Rng & Units 06/17/2023    8:40 AM 12/22/2022   12:00 AM  Foot/eye exam completion dates  Eye Exam No Retinopathy  No Retinopathy  C     Foot Form Completion  Done     C Corrected result   This result is from an external source.        Encounter for immunization After obtaining informed consent, the  influenza vaccine is  administered , with no adverse effect noted at the time of administration.   Hyperlipidemia LDL goal <100 Hyperlipidemia:Low fat diet discussed and encouraged.   Lipid Panel  Lab Results  Component Value Date   CHOL 159 06/17/2023   HDL 37 (L) 06/17/2023   LDLCALC 102 (H) 06/17/2023   TRIG 108 06/17/2023   CHOLHDL 4.3 06/17/2023     Need to verify if taking any meds currently , will need low dose crestor  if she has not been taking any med, will send a message  Moderate episode of recurrent major depressive disorder (HCC) Start low dose fluoxetine  and recommend grief sessions   Visit for screening mammogram Referral entered  Cervical high risk HPV (human papillomavirus) test positive Will f/u with Gyne

## 2023-07-01 ENCOUNTER — Ambulatory Visit (HOSPITAL_COMMUNITY): Payer: PRIVATE HEALTH INSURANCE

## 2023-07-06 MED ORDER — EZETIMIBE 10 MG PO TABS: 10.0000 mg | ORAL_TABLET | Freq: Every day | ORAL | 3 refills | Status: DC

## 2023-07-18 ENCOUNTER — Ambulatory Visit: Payer: PRIVATE HEALTH INSURANCE | Admitting: "Endocrinology

## 2023-07-28 ENCOUNTER — Ambulatory Visit (HOSPITAL_COMMUNITY)
Admission: RE | Admit: 2023-07-28 | Discharge: 2023-07-28 | Disposition: A | Discharge status: Home / Self Care | Payer: PRIVATE HEALTH INSURANCE | Source: Ambulatory Visit | Attending: Family Medicine | Admitting: Family Medicine

## 2023-07-28 ENCOUNTER — Encounter (HOSPITAL_COMMUNITY): Payer: Self-pay

## 2023-07-28 DIAGNOSIS — Z1231 Encounter for screening mammogram for malignant neoplasm of breast: Secondary | ICD-10-CM | POA: Insufficient documentation

## 2023-08-18 ENCOUNTER — Other Ambulatory Visit: Payer: Self-pay | Admitting: Family Medicine

## 2023-08-18 DIAGNOSIS — E1165 Type 2 diabetes mellitus with hyperglycemia: Secondary | ICD-10-CM

## 2023-08-19 IMAGING — MG MM DIGITAL SCREENING BILAT W/ TOMO AND CAD
8 series · 9 of 24 positions shown · non-contrast
Comparison: None.

CLINICAL DATA: Screening.

EXAM:
DIGITAL SCREENING BILATERAL MAMMOGRAM WITH TOMOSYNTHESIS AND CAD
TECHNIQUE: Bilateral screening digital craniocaudal and mediolateral oblique
mammograms were obtained. Bilateral screening digital breast
tomosynthesis was performed. The images were evaluated with
computer-aided detection.

[R CC synth-2D]
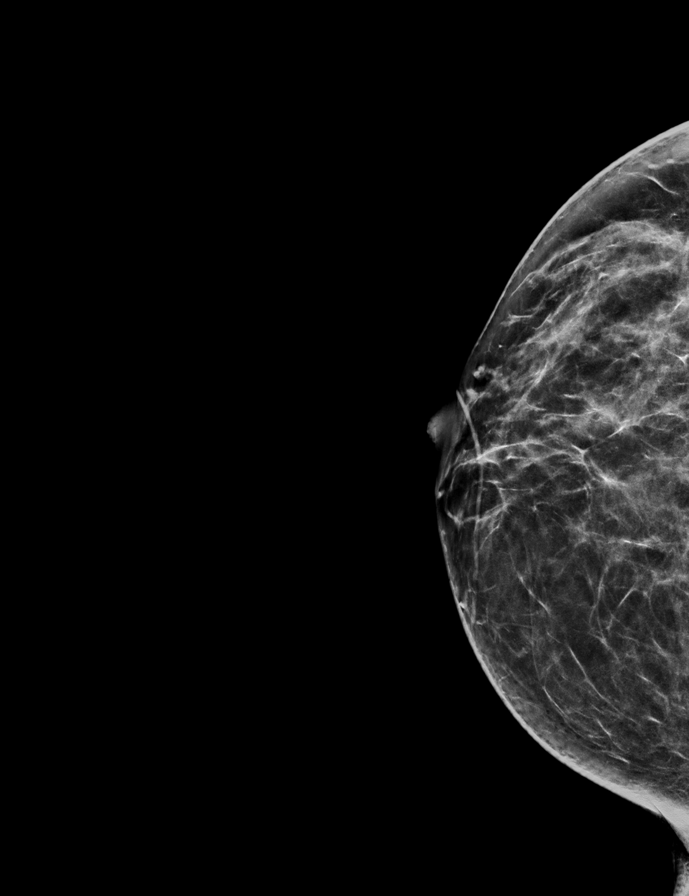

[R MLO synth-2D]
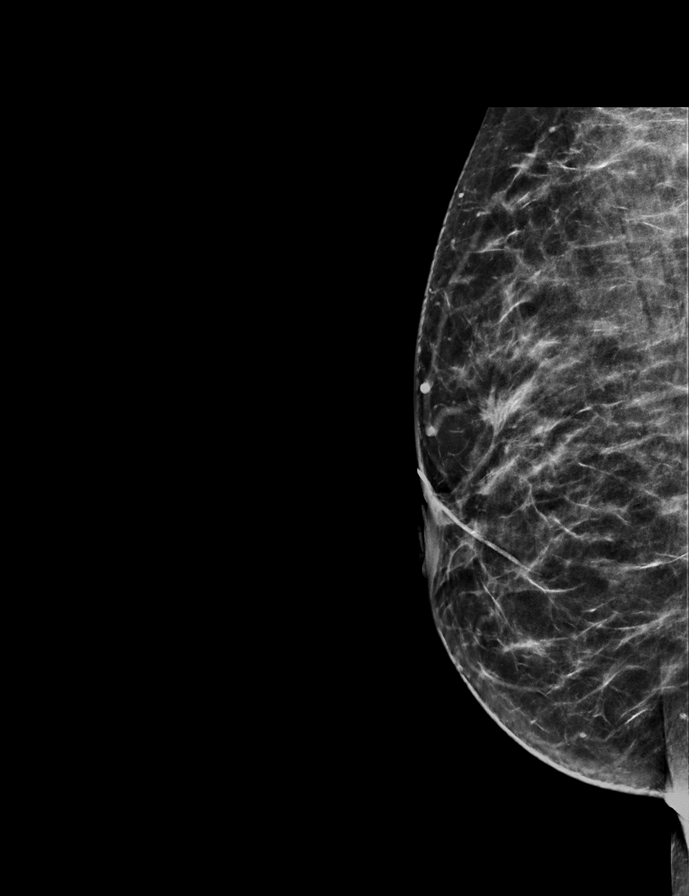

[L CC synth-2D]
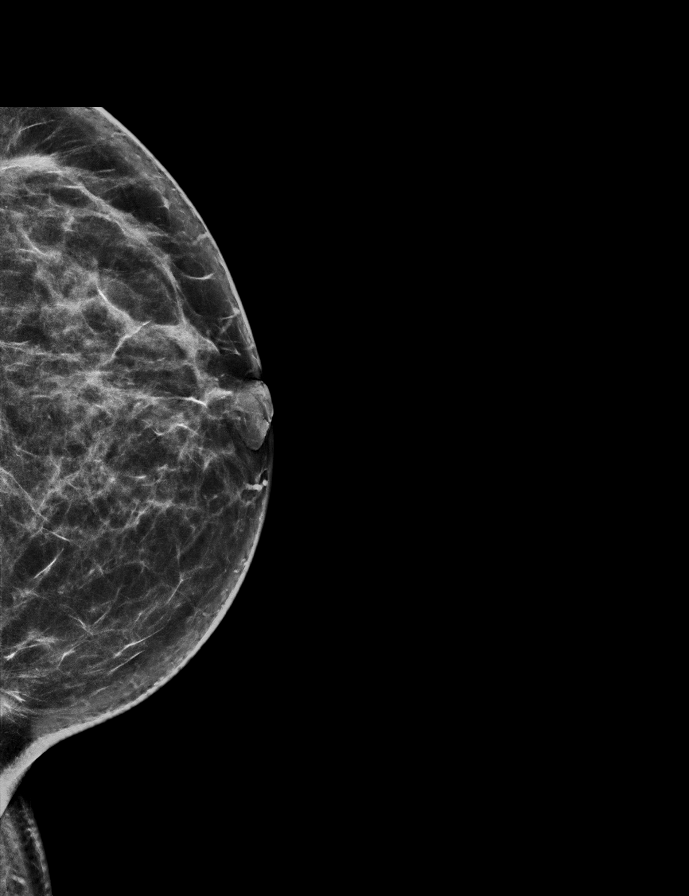

[L MLO synth-2D]
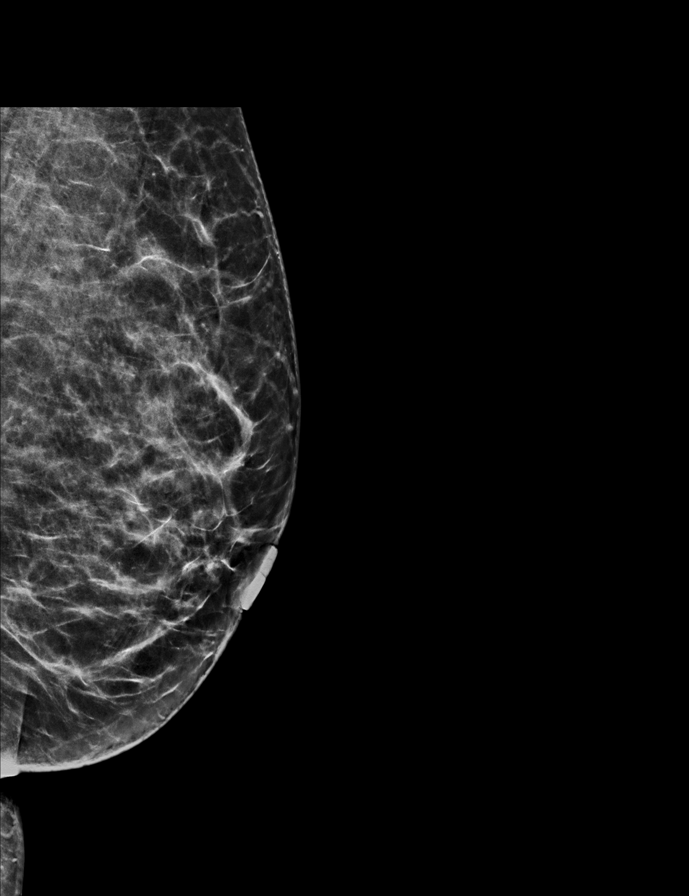

[R CC tomo · 2 of 61 frames shown]
[frame 20/61]
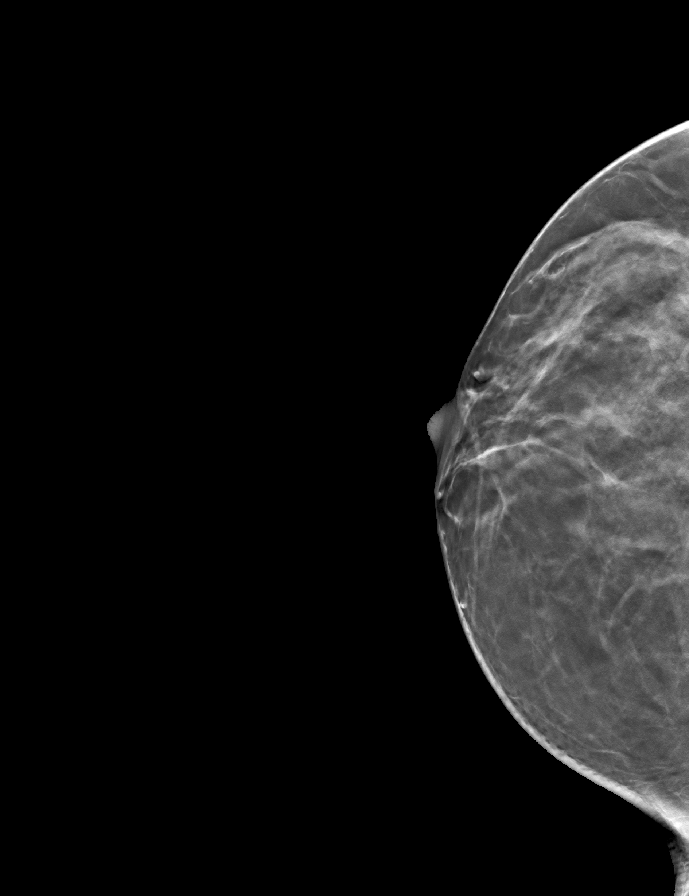
[frame 31/61]
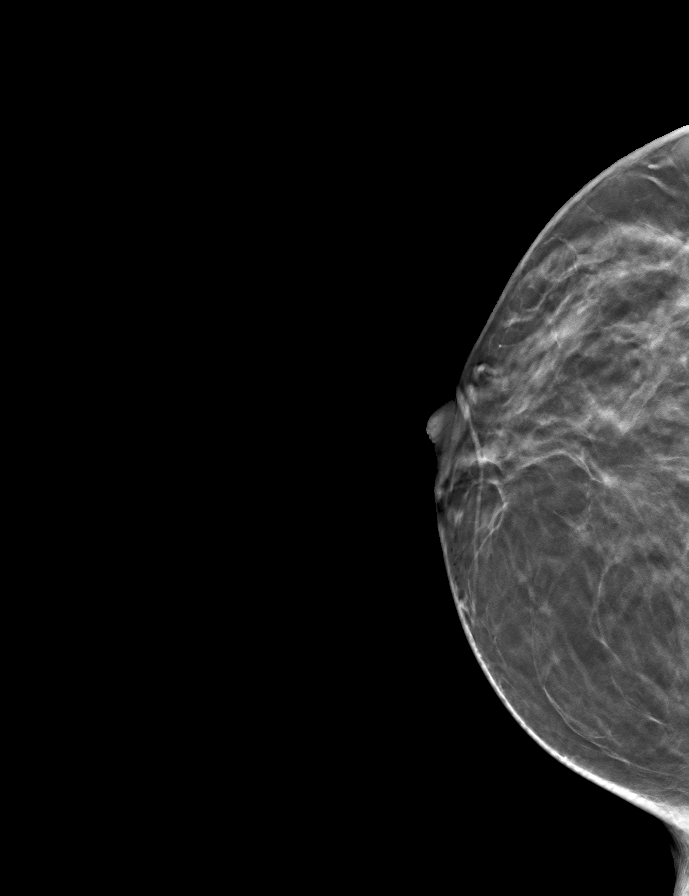

[L MLO tomo · tomo slice 32/63.0]
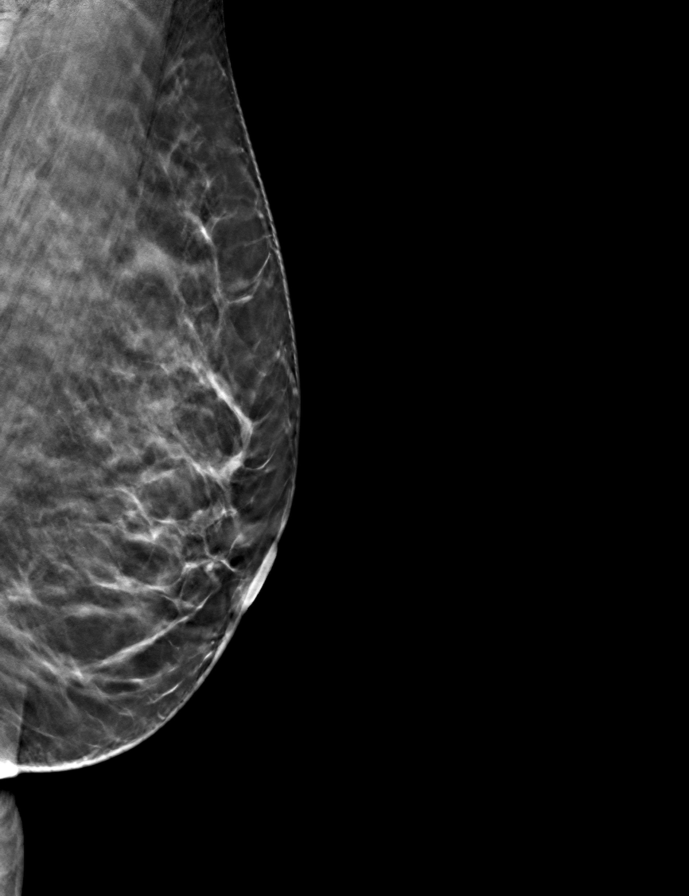

[R MLO tomo · tomo slice 33/64.0]
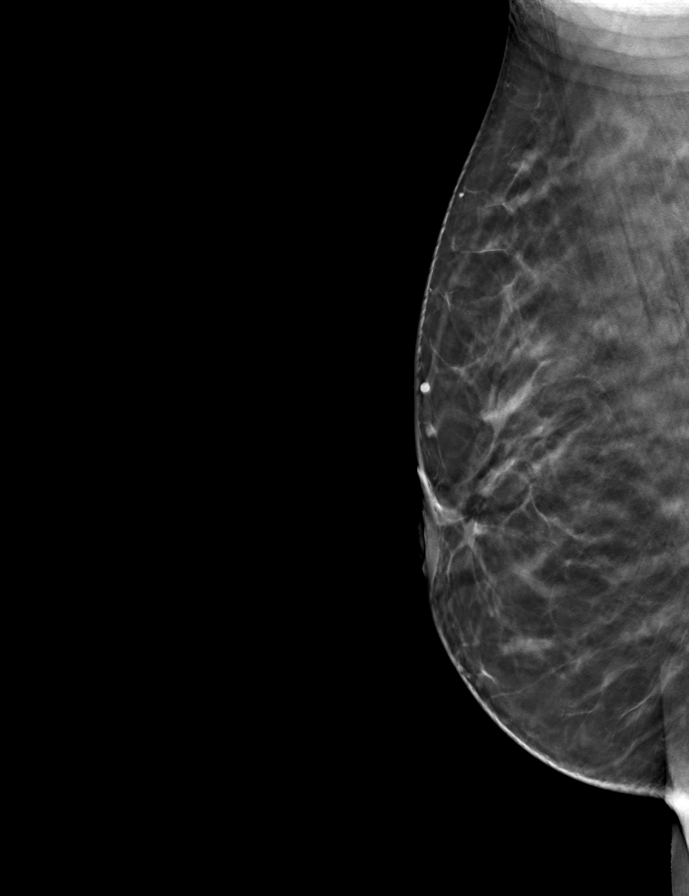

[L CC tomo · tomo slice 33/66.0]
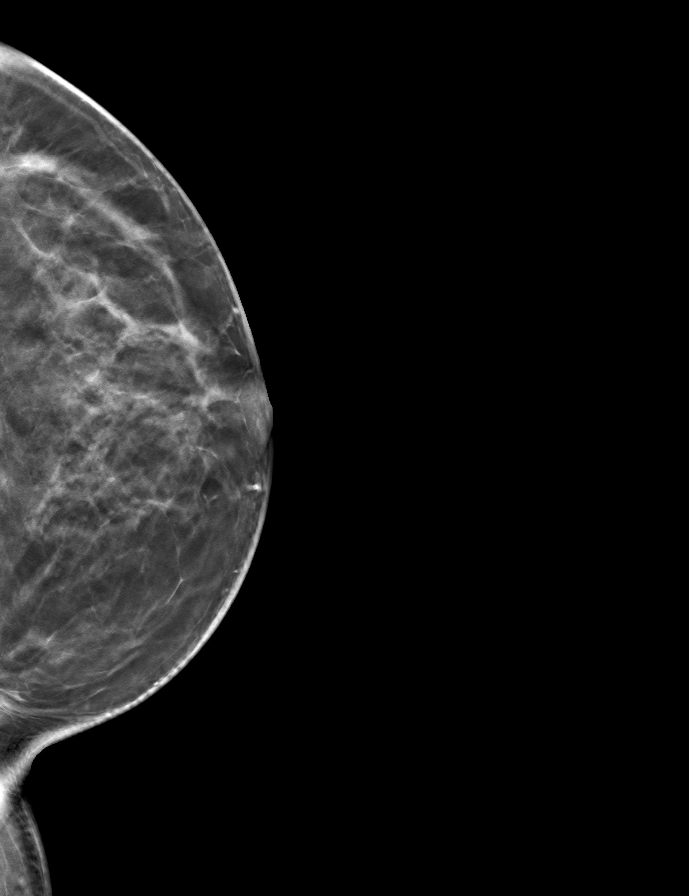

[9 of 24 positions shown; findings below may reference images not displayed]

ACR Breast Density Category c: The breast tissue is heterogeneously
dense, which may obscure small masses
FINDINGS: There are no findings suspicious for malignancy.
IMPRESSION: No mammographic evidence of malignancy. A result letter of this
screening mammogram will be mailed directly to the patient.

RECOMMENDATION:
Screening mammogram in one year. (Code:C8-T-HNK)

BI-RADS CATEGORY  1: Negative.

## 2023-09-09 ENCOUNTER — Other Ambulatory Visit: Payer: Self-pay | Admitting: Family Medicine

## 2023-09-09 DIAGNOSIS — I10 Essential (primary) hypertension: Secondary | ICD-10-CM

## 2023-10-08 ENCOUNTER — Other Ambulatory Visit: Payer: Self-pay | Admitting: Family Medicine

## 2023-10-08 DIAGNOSIS — E1165 Type 2 diabetes mellitus with hyperglycemia: Secondary | ICD-10-CM

## 2023-10-09 ENCOUNTER — Other Ambulatory Visit: Payer: Self-pay | Admitting: Family Medicine

## 2023-10-09 DIAGNOSIS — I10 Essential (primary) hypertension: Secondary | ICD-10-CM

## 2023-10-18 ENCOUNTER — Ambulatory Visit: Payer: PRIVATE HEALTH INSURANCE | Admitting: Family Medicine

## 2023-11-15 ENCOUNTER — Telehealth: Payer: Self-pay | Admitting: Family Medicine

## 2023-11-15 ENCOUNTER — Other Ambulatory Visit: Payer: Self-pay

## 2023-11-15 DIAGNOSIS — E785 Hyperlipidemia, unspecified: Secondary | ICD-10-CM

## 2023-11-15 DIAGNOSIS — Z794 Long term (current) use of insulin: Secondary | ICD-10-CM

## 2023-11-15 DIAGNOSIS — E559 Vitamin D deficiency, unspecified: Secondary | ICD-10-CM

## 2023-11-15 DIAGNOSIS — I10 Essential (primary) hypertension: Secondary | ICD-10-CM

## 2023-11-15 NOTE — Telephone Encounter (Signed)
Ordered. Pt informed.

## 2023-11-15 NOTE — Telephone Encounter (Signed)
 Patient here wants to know if Dr Antonetta wants her to come in before her appointment to have labs done- requesting a call back on what to do.

## 2023-11-17 ENCOUNTER — Ambulatory Visit: Payer: PRIVATE HEALTH INSURANCE | Admitting: Family Medicine

## 2024-01-10 ENCOUNTER — Other Ambulatory Visit: Payer: Self-pay | Admitting: Family Medicine

## 2024-01-10 DIAGNOSIS — E1165 Type 2 diabetes mellitus with hyperglycemia: Secondary | ICD-10-CM

## 2024-01-30 ENCOUNTER — Other Ambulatory Visit: Payer: Self-pay | Admitting: Family Medicine

## 2024-02-15 ENCOUNTER — Other Ambulatory Visit: Payer: Self-pay | Admitting: Family Medicine

## 2024-02-15 DIAGNOSIS — I10 Essential (primary) hypertension: Secondary | ICD-10-CM

## 2024-03-19 ENCOUNTER — Other Ambulatory Visit: Payer: Self-pay | Admitting: Family Medicine

## 2024-03-19 DIAGNOSIS — E785 Hyperlipidemia, unspecified: Secondary | ICD-10-CM

## 2024-03-19 NOTE — Progress Notes (Deleted)
 ANNUAL PREVENTATIVE CARE GYNECOLOGY  ENCOUNTER NOTE  SUBJECTIVE:       [name] Fernandez is a 48 y.o. [id number] female here for a routine annual gynecologic exam. The patient {is/is not/has never been:13135} sexually active. The patient {is/is not:13135} taking hormone replacement therapy. {post-men bleed:13152::Patient denies post-menopausal vaginal bleeding.} Family history of breast, uterine, ovarian cancer: {yes/no:311178}. The patient wears seatbelts: {yes/no:311178}. The patient participates in regular exercise: {yes/no/not asked:9010}. Has the patient ever been transfused or tattooed?: {yes/no/not asked:9010}. The patient reports that there {is/is not:9024} domestic violence in her life. Has the patient completed the Gardasil vaccine? {yes/no:311178}.  Current complaints: 1.  ***    Gynecologic History No LMP recorded. Contraception: {method:5051} Last Pap: 10/21/2021. Results were: ASCUS HPV negative History of abnormal pap: 12/26/2020: LSIL HPV positive (16/18/45 neg); colpo: CIN 1@6 :00, benign ECC History of STIs: trich, chlamydia, syphillis, HSV 2 Last Mammogram: 07/28/2023. Results were: normal Last Colonoscopy: never done   Last Dexa Scan: NA  PHQ-2:     06/17/2023    9:09 AM 12/22/2022    1:42 PM  Depression screen PHQ 2/9  Decreased Interest 0 0  Down, Depressed, Hopeless 2 1  PHQ - 2 Score 2 1  Altered sleeping 3 1  Tired, decreased energy 1 1  Change in appetite 3 1  Feeling bad or failure about yourself  0 0  Trouble concentrating 2 0  Moving slowly or fidgety/restless 0 0  Suicidal thoughts 0 0  PHQ-9 Score 11  4   Difficult doing work/chores Somewhat difficult Somewhat difficult     Data saved with a previous flowsheet row definition    Obstetric History OB History  Gravida Para Term Preterm AB Living  5 3   1 3   SAB IAB Ectopic Multiple Live Births  1        # Outcome Date GA Lbr Len/2nd Weight Sex Type Anes PTL Lv  5 Gravida           4 SAB            3 Para           2 Para           1 Para             Past Medical History:  Diagnosis Date   Abnormal bleeding in menstrual cycle 06/05/2016   Anxiety    Arthritis    Chlamydia infection 03/15/2016   Chronic hypertension affecting pregnancy 10/11/2018   Cough 06/15/2020   Depression    Depression    Phreesia 04/02/2020   Diabetes mellitus    Diabetes mellitus without complication (HCC)    Phreesia 04/02/2020   Encounter for examination following treatment at hospital 06/28/2022   GERD (gastroesophageal reflux disease)    Headache(784.0)    Hyperlipidemia    Hypertension    Malodorous urine 08/18/2021   Multigravida of advanced maternal age in first trimester 10/11/2018   Screening examination for STD (sexually transmitted disease) 07/25/2022   Vulvovaginitis 06/17/2011    Family History  Problem Relation Age of Onset   Depression Mother    Alcohol abuse Mother    Diabetes Father    COPD Father    Breast cancer Maternal Aunt    Anxiety disorder Maternal Aunt    Depression Brother    Sickle cell anemia Brother    Hypertension Brother     Past Surgical History:  Procedure Laterality Date   BREAST BIOPSY  BREAST SURGERY Right    right lumpectomy, benign age 30's   CHOLECYSTECTOMY N/A 12/27/2013   Procedure: LAPAROSCOPIC CHOLECYSTECTOMY ;  Surgeon: Elsie GORMAN Holland, MD;  Location: [facility];  Service: General;  Laterality: N/A;    Social History   Socioeconomic History   Marital status: Divorced    Spouse name: Not on file   Number of children: 3   Years of education: Not on file   Highest education level: Not on file  Occupational History   Not on file  Tobacco Use   Smoking status: Never   Smokeless tobacco: Never  Substance and Sexual Activity   Alcohol use: Yes    Comment: See psychiatry note from 08/03/22   Drug use: Yes    Types: Marijuana    Comment: A couple puffs every night before bed   Sexual activity: Yes    Birth  control/protection: None  Other Topics Concern   Not on file  Social History Narrative   Not on file   Social Drivers of Health   Financial Resource Strain: Not on file  Food Insecurity: Not on file  Transportation Needs: Not on file  Physical Activity: Not on file  Stress: Not on file  Social Connections: Not on file  Intimate Partner Violence: Not on file    Current Outpatient Medications on File Prior to Visit  Medication Sig Dispense Refill   acyclovir  (ZOVIRAX ) 400 MG tablet Take 1 tablet (400 mg total) by mouth 2 (two) times daily. 60 tablet 5   amLODipine  (NORVASC ) 10 MG tablet Take 1 tablet by mouth once daily 60 tablet 0   busPIRone  (BUSPAR ) 5 MG tablet Take 2.5 mg by mouth. In the morning and 5mg  at night before bed.     Calcium  Magnesium Zinc 333-133-5 MG TABS Take by mouth.     Cholecalciferol (VITAMIN D3) 50 MCG (2000 UT) CAPS Take 1 capsule (2,000 Units total) by mouth daily. (Patient not taking: Reported on 06/17/2023) 90 capsule 1   Cinnamon 500 MG TABS Take by mouth. (Patient not taking: Reported on 06/17/2023)     cyanocobalamin (VITAMIN B12) 1000 MCG tablet Take 1,000 mcg by mouth daily.     cyanocobalamin 100 MCG tablet Take 100 mcg by mouth daily.     ezetimibe  (ZETIA ) 10 MG tablet Take 1 tablet (10 mg total) by mouth daily. 90 tablet 3   fenofibrate  (TRICOR ) 145 MG tablet Take 1 tablet (145 mg total) by mouth daily. 30 tablet 5   FLUoxetine  (PROZAC ) 10 MG tablet Take 1 tablet (10 mg total) by mouth daily. 30 tablet 3   glipiZIDE  (GLUCOTROL  XL) 10 MG 24 hr tablet TAKE 2 TABLETS BY MOUTH WITH BREAKFAST IN THE MORNING 60 tablet 0   insulin  glargine-yfgn (SEMGLEE ) 100 UNIT/ML Pen INJECT 60 UNITS SUBCUTANEOUSLY ONCE DAILY 15 mL 0   losartan -hydrochlorothiazide  (HYZAAR) 50-12.5 MG tablet Take 1 tablet by mouth daily. 30 tablet 5   Multiple Vitamin (MULTIVITAMIN) capsule Take 1 capsule by mouth daily.     ondansetron  (ZOFRAN ) 4 MG tablet Take 1 tablet (4 mg total) by  mouth every 8 (eight) hours as needed for nausea or vomiting. (Patient not taking: Reported on 06/17/2023) 20 tablet 0   Prenatal Vit-Fe Fumarate-FA (PRENATAL MULTIVITAMIN) TABS tablet Take 1 tablet by mouth daily at 12 noon.     rosuvastatin  (CRESTOR ) 40 MG tablet Take 1 tablet by mouth once daily 30 tablet 0   valACYclovir  (VALTREX ) 1000 MG tablet Take 1 tablet (1,000 mg  total) by mouth daily. 5 tablet 2   Current Facility-Administered Medications on File Prior to Visit  Medication Dose Route Frequency Provider Last Rate Last Admin   pramoxine-hydrocortisone  (PROCTOCREAM-HC) rectal cream   Rectal TID Edsel Norleen GAILS, MD        Allergies  Allergen Reactions   Janumet  [Sitagliptin  Phos-Metformin  Hcl] Nausea And Vomiting   Lisinopril  Cough     Review of Systems ROS Review of Systems - General ROS: negative for - chills, fatigue, fever, hot flashes, night sweats, weight gain or weight loss Psychological ROS: negative for - anxiety, decreased libido, depression, mood swings, physical abuse or sexual abuse Ophthalmic ROS: negative for - blurry vision, eye pain or loss of vision ENT ROS: negative for - headaches, hearing change, visual changes or vocal changes Allergy and Immunology ROS: negative for - hives, itchy/watery eyes or seasonal allergies Hematological and Lymphatic ROS: negative for - bleeding problems, bruising, swollen lymph nodes or weight loss Endocrine ROS: negative for - galactorrhea, hair pattern changes, hot flashes, malaise/lethargy, mood swings, palpitations, polydipsia/polyuria, skin changes, temperature intolerance or unexpected weight changes Breast ROS: negative for - new or changing breast lumps or nipple discharge Respiratory ROS: negative for - cough or shortness of breath Cardiovascular ROS: negative for - chest pain, irregular heartbeat, palpitations or shortness of breath Gastrointestinal ROS: no abdominal pain, change in bowel habits, or black or bloody  stools Genito-Urinary ROS: no dysuria, trouble voiding, or hematuria Musculoskeletal ROS: negative for - joint pain or joint stiffness Neurological ROS: negative for - bowel and bladder control changes Dermatological ROS: negative for rash and skin lesion changes   OBJECTIVE:   There were no vitals taken for this visit.  CONSTITUTIONAL: Well-developed, well-nourished female in no acute distress.  PSYCHIATRIC: Normal mood and affect. Normal behavior. Normal judgment and thought content. NEUROLGIC: Alert and oriented to person, place, and time. Normal muscle tone coordination. No cranial nerve deficit noted. HENT:  Normocephalic, atraumatic, External right and left ear normal. Oropharynx is clear and moist EYES: Conjunctivae and EOM are normal. No scleral icterus.  NECK: Normal range of motion, supple, no masses.  Normal thyroid .  SKIN: Skin is warm and dry. No rash noted. Not diaphoretic. No erythema. No pallor. CARDIOVASCULAR: Normal heart rate noted, regular rhythm, no murmur. RESPIRATORY: Clear to auscultation bilaterally. Effort and breath sounds normal, no problems with respiration noted. BREASTS: Symmetric in size. No masses, skin changes, nipple drainage, or lymphadenopathy. ABDOMEN: Soft, normal bowel sounds, no distention noted.  No tenderness, rebound or guarding.  PELVIC:  Bladder {:311640}  Urethra: {:311719}  Vulva: {:311722}  Vagina: {:311643}  Cervix: {:311644}  Uterus: {:311718}  Adnexa: {:311645}  RV: {Blank multiple:19196::External Exam NormaI,No Rectal Masses,Normal Sphincter tone}  MUSCULOSKELETAL: Normal range of motion. No tenderness.  No cyanosis, clubbing, or edema.  2+ distal pulses. LYMPHATIC: No Axillary, Supraclavicular, or Inguinal Adenopathy.  Labs: Lab Results  Component Value Date   WBC 9.6 12/23/2022   HGB 12.9 12/23/2022   HCT 38.6 12/23/2022   MCV 81 12/23/2022   PLT 394 12/23/2022    Lab Results  Component Value Date   CREATININE  0.99 06/17/2023   BUN 11 06/17/2023   NA 139 06/17/2023   K 3.8 06/17/2023   CL 99 06/17/2023   CO2 22 06/17/2023    Lab Results  Component Value Date   ALT 12 06/17/2023   AST 17 06/17/2023   ALKPHOS 60 06/17/2023   BILITOT 0.3 06/17/2023    Lab Results  Component  Value Date   CHOL 159 06/17/2023   HDL 37 (L) 06/17/2023   LDLCALC 102 (H) 06/17/2023   TRIG 108 06/17/2023   CHOLHDL 4.3 06/17/2023    Lab Results  Component Value Date   TSH 1.600 04/20/2022    Lab Results  Component Value Date   HGBA1C 7.8 (H) 06/17/2023     ASSESSMENT:   No diagnosis found.   PLAN:   AVRIELLE FRY is a 48 y.o. H4E9986 female here today for her annual exam, doing well.  Pap: done with cotesting today Mammogram: ordered***due *** Colon: PCP *** ordered colonoscopy***Cologuard -OR- due *** Labs: ***A1C, CMP, HepC, Lipid panel, Vit D, TSH PHQ-2 = ***, discussed coping techniques; RTC if worsens or develops concern Contraception: *** Healthy lifestyle modifications discussed: multivitamin, diet, exercise, sunscreen, tobacco and alcohol use. Emphasized importance of regular physical activity.  Calcium  and Vit D recommendation reviewed.  All questions answered to patient's satisfaction.   Follow up 1 yr for annual, sooner prn.    Estil Mangle, DO [facility]

## 2024-03-21 ENCOUNTER — Encounter: Payer: Self-pay | Admitting: Family Medicine

## 2024-03-21 ENCOUNTER — Ambulatory Visit (INDEPENDENT_AMBULATORY_CARE_PROVIDER_SITE_OTHER): Payer: PRIVATE HEALTH INSURANCE | Admitting: Family Medicine

## 2024-03-21 ENCOUNTER — Other Ambulatory Visit (HOSPITAL_BASED_OUTPATIENT_CLINIC_OR_DEPARTMENT_OTHER): Payer: Self-pay

## 2024-03-21 VITALS — BP 180/82 | HR 63 | Resp 18 | Ht 60.0 in | Wt 119.1 lb

## 2024-03-21 DIAGNOSIS — E785 Hyperlipidemia, unspecified: Secondary | ICD-10-CM | POA: Diagnosis not present

## 2024-03-21 DIAGNOSIS — Z1211 Encounter for screening for malignant neoplasm of colon: Secondary | ICD-10-CM

## 2024-03-21 DIAGNOSIS — I10 Essential (primary) hypertension: Secondary | ICD-10-CM | POA: Diagnosis not present

## 2024-03-21 DIAGNOSIS — R300 Dysuria: Secondary | ICD-10-CM

## 2024-03-21 DIAGNOSIS — Z7251 High risk heterosexual behavior: Secondary | ICD-10-CM

## 2024-03-21 DIAGNOSIS — Z202 Contact with and (suspected) exposure to infections with a predominantly sexual mode of transmission: Secondary | ICD-10-CM

## 2024-03-21 DIAGNOSIS — E559 Vitamin D deficiency, unspecified: Secondary | ICD-10-CM

## 2024-03-21 DIAGNOSIS — Z23 Encounter for immunization: Secondary | ICD-10-CM | POA: Diagnosis not present

## 2024-03-21 DIAGNOSIS — E1165 Type 2 diabetes mellitus with hyperglycemia: Secondary | ICD-10-CM | POA: Diagnosis not present

## 2024-03-21 DIAGNOSIS — Z794 Long term (current) use of insulin: Secondary | ICD-10-CM

## 2024-03-21 MED ORDER — AMLODIPINE BESYLATE 10 MG PO TABS
10.0000 mg | ORAL_TABLET | Freq: Every day | ORAL | 1 refills | Status: AC
  Filled 2024-03-21: qty 90, 90d supply, fill #0

## 2024-03-21 MED ORDER — LOSARTAN POTASSIUM-HCTZ 50-12.5 MG PO TABS
1.0000 | ORAL_TABLET | Freq: Every day | ORAL | 3 refills | Status: AC
  Filled 2024-03-21: qty 90, 90d supply, fill #0

## 2024-03-21 NOTE — Progress Notes (Signed)
 [facility]   [name] Fernandez     MRN: [id number]      DOB: 1975/09/16  Chief Complaint  Patient presents with   Medical Management of Chronic Issues    Follow up. Wants labs    Urinary Tract Infection    Pt has been ongoing urinary urgency for months. No other sx's. No fever     HPI [name] Fernandez is here for follow up and re-evaluation of chronic medical conditions, medication management and review of any available recent lab and radiology data.  Preventive health is updated, specifically  Cancer screening and Immunization.   Currently out of medications except for diabets Denies polyuria, polydipsia, blurred vision , or hypoglycemic episodes. Wants std testing   ROS Denies recent fever or chills. Denies sinus pressure, nasal congestion, ear pain or sore throat. Denies chest congestion, productive cough or wheezing. Denies chest pains, palpitations and leg swelling Denies abdominal pain, nausea, vomiting,diarrhea or constipation.   C/o dysuria and  frequency,  Denies joint pain, swelling and limitation in mobility. Denies headaches, seizures, numbness, or tingling. Denies  uncontrolled depression, anxiety or insomnia. Denies skin break down or rash.   PE  BP (!) 180/82   Pulse 63   Resp 18   Ht 5' (1.524 m)   Wt 119 lb 1.3 oz (54 kg)   LMP 03/21/2024 (Exact Date)   SpO2 98%   BMI 23.26 kg/m   Patient alert and oriented and in no cardiopulmonary distress.  HEENT: No facial asymmetry, EOMI,     Neck supple .  Chest: Clear to auscultation bilaterally.  CVS: S1, S2 no murmurs, no S3.Regular rate.  ABD: Soft non tender.   Ext: No edema  MS: Adequate ROM spine, shoulders, hips and knees.  Skin: Intact, no ulcerations or rash noted.  Psych: Good eye contact, normal affect. Memory intact not anxious or depressed appearing.  CNS: CN 2-12 intact, power,  normal throughout.no focal deficits noted.   Assessment & Plan  Type 2 diabetes mellitus with hyperglycemia  (HCC) Diabetes associated with hypertension and hyperlipidemia  [name] is reminded of the importance of commitment to daily physical activity for 30 minutes or more, as able and the need to limit carbohydrate intake to 30 to 60 grams per meal to help with blood sugar control.   The need to take medication as prescribed, test blood sugar as directed, and to call between visits if there is a concern that blood sugar is uncontrolled is also discussed.   [name] is reminded of the importance of daily foot exam, annual eye examination, and good blood sugar, blood pressure and cholesterol control.     Latest Ref Rng & Units 03/21/2024    3:00 PM 03/21/2024    2:55 PM 06/17/2023   10:07 AM 12/28/2022    5:05 PM 12/23/2022    8:52 AM  Diabetic Labs  HbA1c 4.8 - 5.6 %  6.7  7.8   8.3   Micro/Creat Ratio 0 - 29 mg/g creat 19    <23    Chol 100 - 199 mg/dL  787  840   841   HDL >60 mg/dL  40  37   38   Calc LDL 0 - 99 mg/dL  882  897   895   Triglycerides 0 - 149 mg/dL  681  891   84   Creatinine 0.57 - 1.00 mg/dL  9.12  9.00   8.88       03/21/2024  2:43 PM 03/21/2024    2:12 PM 06/17/2023    9:23 AM 06/17/2023    9:04 AM 12/22/2022    1:40 PM 07/22/2022    9:23 AM 07/22/2022    9:13 AM  BP/Weight  Systolic BP 180 160 124 146 133 164 152  Diastolic BP 82 82 62 82 81 88 84  Wt. (Lbs)  119.08  120 120.04    BMI  23.26 kg/m2  23.44 kg/m2 23.44 kg/m2        Latest Ref Rng & Units 06/17/2023    8:40 AM 12/22/2022   12:00 AM  Foot/eye exam completion dates  Eye Exam No Retinopathy  No Retinopathy  C     Foot Form Completion  Done     C Corrected result   This result is from an external source.      Controlled, no change in medication   Hyperlipidemia LDL goal <100 Hyperlipidemia:Low fat diet discussed and encouraged.   Lipid Panel  Lab Results  Component Value Date   CHOL 212 (H) 03/21/2024   HDL 40 03/21/2024   LDLCALC 117 (H) 03/21/2024   TRIG 318 (H) 03/21/2024    CHOLHDL 5.3 (H) 03/21/2024     Uncontrolled needs to lower fat intake and comply with meds  Essential hypertension Un Controlled, out of medication , nurse re eval in 2 weeks DASH diet and commitment to daily physical activity for a minimum of 30 minutes discussed and encouraged, as a part of hypertension management. The importance of attaining a healthy weight is also discussed.     03/21/2024    2:43 PM 03/21/2024    2:12 PM 06/17/2023    9:23 AM 06/17/2023    9:04 AM 12/22/2022    1:40 PM 07/22/2022    9:23 AM 07/22/2022    9:13 AM  BP/Weight  Systolic BP 180 160 124 146 133 164 152  Diastolic BP 82 82 62 82 81 88 84  Wt. (Lbs)  119.08  120 120.04    BMI  23.26 kg/m2  23.44 kg/m2 23.44 kg/m2         High risk sexual behavior Use of condoms and safe sex practices discussed and encouraged, STD testing for HIV and syphilis at visit, has upcoming gyne eval  Influenza vaccination administered at current visit After obtaining informed consent, the vaccine is  administered , with no adverse effect noted at the time of administration.

## 2024-03-21 NOTE — Patient Instructions (Addendum)
 F/U in 4 months  Flu vaccine today  Pneumonia vaccine in 1 week and nurse BP re check  HPV #1 in 2 weeks then 8 weeks  Urine ACR, lipid, cmp and EGFR, hBA1C, TSh, vit D CBC, HIV and RPR today, also CCUA and reflex c/S  You are being referred for colonoscopy  Pls schedule mammogram at checkout  Thanks for choosing [facility], we consider it a privelige to serve you.

## 2024-03-22 ENCOUNTER — Other Ambulatory Visit (HOSPITAL_COMMUNITY): Payer: Self-pay | Admitting: Family Medicine

## 2024-03-22 ENCOUNTER — Other Ambulatory Visit (HOSPITAL_BASED_OUTPATIENT_CLINIC_OR_DEPARTMENT_OTHER): Payer: Self-pay

## 2024-03-22 ENCOUNTER — Ambulatory Visit: Payer: Self-pay | Admitting: Family Medicine

## 2024-03-22 DIAGNOSIS — Z1231 Encounter for screening mammogram for malignant neoplasm of breast: Secondary | ICD-10-CM

## 2024-03-22 LAB — RPR: RPR Ser Ql: NONREACTIVE

## 2024-03-22 LAB — LIPID PANEL
Chol/HDL Ratio: 5.3 ratio — ABNORMAL HIGH (ref 0.0–4.4)
Cholesterol, Total: 212 mg/dL — ABNORMAL HIGH (ref 100–199)
HDL: 40 mg/dL (ref >39)
LDL Chol Calc (NIH): 117 mg/dL — ABNORMAL HIGH (ref 0–99)
Triglycerides: 318 mg/dL — ABNORMAL HIGH (ref 0–149)
VLDL Cholesterol Cal: 55 mg/dL — ABNORMAL HIGH (ref 5–40)

## 2024-03-22 LAB — CBC WITH DIFFERENTIAL/PLATELET
Basophils Absolute: 0.1 x10E3/uL (ref 0.0–0.2)
Basos: 1 %
EOS (ABSOLUTE): 0.1 x10E3/uL (ref 0.0–0.4)
Eos: 2 %
Hematocrit: 38.3 % (ref 34.0–46.6)
Hemoglobin: 12.5 g/dL (ref 11.1–15.9)
Immature Grans (Abs): 0 x10E3/uL (ref 0.0–0.1)
Immature Granulocytes: 0 %
Lymphocytes Absolute: 2.7 x10E3/uL (ref 0.7–3.1)
Lymphs: 31 %
MCH: 27.7 pg (ref 26.6–33.0)
MCHC: 32.6 g/dL (ref 31.5–35.7)
MCV: 85 fL (ref 79–97)
Monocytes Absolute: 0.9 x10E3/uL (ref 0.1–0.9)
Monocytes: 11 %
Neutrophils Absolute: 4.8 x10E3/uL (ref 1.4–7.0)
Neutrophils: 55 %
Platelets: 371 x10E3/uL (ref 150–450)
RBC: 4.51 x10E6/uL (ref 3.77–5.28)
RDW: 13.5 % (ref 11.7–15.4)
WBC: 8.6 x10E3/uL (ref 3.4–10.8)

## 2024-03-22 LAB — UA/M W/RFLX CULTURE, ROUTINE
Bilirubin, UA: NEGATIVE
Ketones, UA: NEGATIVE
Leukocytes,UA: NEGATIVE
Nitrite, UA: NEGATIVE
Protein,UA: NEGATIVE
RBC, UA: NEGATIVE
Specific Gravity, UA: 1.02 (ref 1.005–1.030)
Urobilinogen, Ur: 0.2 mg/dL (ref 0.2–1.0)
pH, UA: 6 (ref 5.0–7.5)

## 2024-03-22 LAB — TSH: TSH: 1.56 u[IU]/mL (ref 0.450–4.500)

## 2024-03-22 LAB — CMP14+EGFR
ALT: 15 IU/L (ref 0–32)
AST: 21 IU/L (ref 0–40)
Albumin: 4.4 g/dL (ref 3.9–4.9)
Alkaline Phosphatase: 64 IU/L (ref 41–116)
BUN/Creatinine Ratio: 11 (ref 9–23)
BUN: 10 mg/dL (ref 6–24)
Bilirubin Total: 0.3 mg/dL (ref 0.0–1.2)
CO2: 26 mmol/L (ref 20–29)
Calcium: 9.9 mg/dL (ref 8.7–10.2)
Chloride: 98 mmol/L (ref 96–106)
Creatinine, Ser: 0.87 mg/dL (ref 0.57–1.00)
Globulin, Total: 3.2 g/dL (ref 1.5–4.5)
Glucose: 75 mg/dL (ref 70–99)
Potassium: 3.5 mmol/L (ref 3.5–5.2)
Sodium: 137 mmol/L (ref 134–144)
Total Protein: 7.6 g/dL (ref 6.0–8.5)
eGFR: 84 mL/min/1.73 (ref >59)

## 2024-03-22 LAB — MICROALBUMIN / CREATININE URINE RATIO
Creatinine, Urine: 115 mg/dL
Microalb/Creat Ratio: 19 mg/g{creat} (ref 0–29)
Microalbumin, Urine: 21.9 ug/mL

## 2024-03-22 LAB — MICROSCOPIC EXAMINATION
Bacteria, UA: NONE SEEN
Casts: NONE SEEN /LPF
RBC, Urine: NONE SEEN /HPF (ref 0–2)
WBC, UA: NONE SEEN /HPF (ref 0–5)

## 2024-03-22 LAB — VITAMIN D 25 HYDROXY (VIT D DEFICIENCY, FRACTURES): Vit D, 25-Hydroxy: 27.6 ng/mL — ABNORMAL LOW (ref 30.0–100.0)

## 2024-03-22 LAB — HEMOGLOBIN A1C
Est. average glucose Bld gHb Est-mCnc: 146 mg/dL
Hgb A1c MFr Bld: 6.7 % — ABNORMAL HIGH (ref 4.8–5.6)

## 2024-03-22 LAB — HIV ANTIBODY (ROUTINE TESTING W REFLEX): HIV Screen 4th Generation wRfx: NONREACTIVE

## 2024-03-22 MED ORDER — ROSUVASTATIN CALCIUM 40 MG PO TABS
40.0000 mg | ORAL_TABLET | Freq: Every day | ORAL | 1 refills | Status: AC
  Filled 2024-03-22: qty 90, 90d supply, fill #0

## 2024-03-22 MED ORDER — EZETIMIBE 10 MG PO TABS
10.0000 mg | ORAL_TABLET | Freq: Every day | ORAL | 1 refills | Status: AC
  Filled 2024-03-22: qty 90, 90d supply, fill #0

## 2024-03-26 ENCOUNTER — Encounter: Payer: Self-pay | Admitting: Family Medicine

## 2024-03-26 DIAGNOSIS — R300 Dysuria: Secondary | ICD-10-CM | POA: Insufficient documentation

## 2024-03-26 DIAGNOSIS — Z202 Contact with and (suspected) exposure to infections with a predominantly sexual mode of transmission: Secondary | ICD-10-CM | POA: Insufficient documentation

## 2024-03-26 DIAGNOSIS — Z23 Encounter for immunization: Secondary | ICD-10-CM | POA: Insufficient documentation

## 2024-03-26 NOTE — Assessment & Plan Note (Signed)
 Un Controlled, out of medication , nurse re eval in 2 weeks DASH diet and commitment to daily physical activity for a minimum of 30 minutes discussed and encouraged, as a part of hypertension management. The importance of attaining a healthy weight is also discussed.     03/21/2024    2:43 PM 03/21/2024    2:12 PM 06/17/2023    9:23 AM 06/17/2023    9:04 AM 12/22/2022    1:40 PM 07/22/2022    9:23 AM 07/22/2022    9:13 AM  BP/Weight  Systolic BP 180 160 124 146 133 164 152  Diastolic BP 82 82 62 82 81 88 84  Wt. (Lbs)  119.08  120 120.04    BMI  23.26 kg/m2  23.44 kg/m2 23.44 kg/m2

## 2024-03-26 NOTE — Assessment & Plan Note (Signed)
 Diabetes associated with hypertension and hyperlipidemia  [name] Fernandez is reminded of the importance of commitment to daily physical activity for 30 minutes or more, as able and the need to limit carbohydrate intake to 30 to 60 grams per meal to help with blood sugar control.   The need to take medication as prescribed, test blood sugar as directed, and to call between visits if there is a concern that blood sugar is uncontrolled is also discussed.   [name] Fernandez is reminded of the importance of daily foot exam, annual eye examination, and good blood sugar, blood pressure and cholesterol control.     Latest Ref Rng & Units 03/21/2024    3:00 PM 03/21/2024    2:55 PM 06/17/2023   10:07 AM 12/28/2022    5:05 PM 12/23/2022    8:52 AM  Diabetic Labs  HbA1c 4.8 - 5.6 %  6.7  7.8   8.3   Micro/Creat Ratio 0 - 29 mg/g creat 19    <23    Chol 100 - 199 mg/dL  787  840   841   HDL >60 mg/dL  40  37   38   Calc LDL 0 - 99 mg/dL  882  897   895   Triglycerides 0 - 149 mg/dL  681  891   84   Creatinine 0.57 - 1.00 mg/dL  9.12  9.00   8.88       03/21/2024    2:43 PM 03/21/2024    2:12 PM 06/17/2023    9:23 AM 06/17/2023    9:04 AM 12/22/2022    1:40 PM 07/22/2022    9:23 AM 07/22/2022    9:13 AM  BP/Weight  Systolic BP 180 160 124 146 133 164 152  Diastolic BP 82 82 62 82 81 88 84  Wt. (Lbs)  119.08  120 120.04    BMI  23.26 kg/m2  23.44 kg/m2 23.44 kg/m2        Latest Ref Rng & Units 06/17/2023    8:40 AM 12/22/2022   12:00 AM  Foot/eye exam completion dates  Eye Exam No Retinopathy  No Retinopathy  C     Foot Form Completion  Done     C Corrected result   This result is from an external source.      Controlled, no change in medication

## 2024-03-26 NOTE — Assessment & Plan Note (Signed)
CCUA and c/s if abn 

## 2024-03-26 NOTE — Assessment & Plan Note (Signed)
 Use of condoms and safe sex practices discussed and encouraged, STD testing for HIV and syphilis at visit, has upcoming gyne eval

## 2024-03-26 NOTE — Assessment & Plan Note (Signed)
 After obtaining informed consent, the vaccine is  administered , with no adverse effect noted at the time of administration.

## 2024-03-26 NOTE — Assessment & Plan Note (Signed)
 Hyperlipidemia:Low fat diet discussed and encouraged.   Lipid Panel  Lab Results  Component Value Date   CHOL 212 (H) 03/21/2024   HDL 40 03/21/2024   LDLCALC 117 (H) 03/21/2024   TRIG 318 (H) 03/21/2024   CHOLHDL 5.3 (H) 03/21/2024     Uncontrolled needs to lower fat intake and comply with meds

## 2024-03-27 ENCOUNTER — Ambulatory Visit: Payer: PRIVATE HEALTH INSURANCE | Admitting: Obstetrics

## 2024-03-27 ENCOUNTER — Encounter (INDEPENDENT_AMBULATORY_CARE_PROVIDER_SITE_OTHER): Payer: Self-pay | Admitting: *Deleted

## 2024-03-28 ENCOUNTER — Ambulatory Visit (INDEPENDENT_AMBULATORY_CARE_PROVIDER_SITE_OTHER): Payer: PRIVATE HEALTH INSURANCE

## 2024-03-28 DIAGNOSIS — I10 Essential (primary) hypertension: Secondary | ICD-10-CM | POA: Diagnosis not present

## 2024-03-28 NOTE — Progress Notes (Signed)
 Patient is in office today for a nurse visit for Blood Pressure Check. Patient blood pressure was 165/86, Patient No chest pain, No shortness of breath, No dyspnea on exertion  Pt has not been able to get new bp med due to insurance complications but is working on it

## 2024-04-02 ENCOUNTER — Encounter (HOSPITAL_BASED_OUTPATIENT_CLINIC_OR_DEPARTMENT_OTHER): Payer: Self-pay | Admitting: Pharmacist

## 2024-04-02 ENCOUNTER — Other Ambulatory Visit (HOSPITAL_BASED_OUTPATIENT_CLINIC_OR_DEPARTMENT_OTHER): Payer: Self-pay

## 2024-04-04 ENCOUNTER — Ambulatory Visit: Payer: PRIVATE HEALTH INSURANCE

## 2024-04-06 ENCOUNTER — Other Ambulatory Visit (HOSPITAL_BASED_OUTPATIENT_CLINIC_OR_DEPARTMENT_OTHER): Payer: Self-pay

## 2024-04-11 ENCOUNTER — Ambulatory Visit: Payer: PRIVATE HEALTH INSURANCE | Admitting: Registered Nurse

## 2024-04-20 ENCOUNTER — Other Ambulatory Visit (HOSPITAL_BASED_OUTPATIENT_CLINIC_OR_DEPARTMENT_OTHER): Payer: Self-pay

## 2024-04-22 ENCOUNTER — Other Ambulatory Visit: Payer: Self-pay | Admitting: Family Medicine

## 2024-04-22 DIAGNOSIS — E785 Hyperlipidemia, unspecified: Secondary | ICD-10-CM

## 2024-04-25 ENCOUNTER — Encounter (INDEPENDENT_AMBULATORY_CARE_PROVIDER_SITE_OTHER): Payer: Self-pay | Admitting: *Deleted

## 2024-05-07 ENCOUNTER — Ambulatory Visit: Payer: Self-pay

## 2024-05-07 NOTE — Telephone Encounter (Signed)
Noted patient scheduled

## 2024-05-07 NOTE — Telephone Encounter (Signed)
 FYI Only or Action Required?: FYI only for provider: appointment scheduled on 1/05.  Patient was last seen in primary care on 03/21/2024 by Antonetta Rollene BRAVO, MD.  Called Nurse Triage reporting Arm Pain.  Symptoms began several weeks ago.  Interventions attempted: Nothing.  Symptoms are: unchanged.  Triage Disposition: See PCP When Office is Open (Within 3 Days)  Patient/caregiver understands and will follow disposition?: Yes, will follow disposition  Copied from CRM #[id number]. Topic: Clinical - Red Word Triage >> May 07, 2024 11:42 AM Victoria B wrote: [facility] that prompted transfer to Nurse Triage: patient has severe pain in left arm Reason for Disposition  [1] MODERATE pain (e.g., interferes with normal activities) AND [2] present > 3 days  Answer Assessment - Initial Assessment Questions 1. ONSET: When did the pain start?     Couple weeks 2. LOCATION: Where is the pain located?     L arm pain,  3. PAIN: How bad is the pain? (Scale 0-10; or none, mild, moderate, severe)     10 with movement 4. WORK OR EXERCISE: Has there been any recent work or exercise that involved this part of the body?     Denies injury 5. CAUSE: What do you think is causing the arm pain?     Think I tore something 6. OTHER SYMPTOMS: Do you have any other symptoms? (e.g., neck pain, swelling, rash, fever, numbness, weakness)     Intermittent numbness/tingling, denies swelling, denies redness  Protocols used: Arm Pain-A-AH

## 2024-05-14 ENCOUNTER — Ambulatory Visit (INDEPENDENT_AMBULATORY_CARE_PROVIDER_SITE_OTHER): Payer: Self-pay

## 2024-05-14 VITALS — BP 123/83 | HR 81 | Ht 60.0 in | Wt 112.0 lb

## 2024-05-14 DIAGNOSIS — M7552 Bursitis of left shoulder: Secondary | ICD-10-CM

## 2024-05-14 MED ORDER — IBUPROFEN 600 MG PO TABS: 600.0000 mg | ORAL_TABLET | Freq: Three times a day (TID) | ORAL | 0 refills | Status: AC | PRN

## 2024-05-14 MED ORDER — METHYLPREDNISOLONE 4 MG PO TBPK: ORAL_TABLET | ORAL | 0 refills | Status: AC
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
## 2024-05-15 DIAGNOSIS — M7552 Bursitis of left shoulder: Secondary | ICD-10-CM | POA: Insufficient documentation

## 2024-05-15 NOTE — Assessment & Plan Note (Signed)
 Acute bursitis with pain, numbness, and tingling, likely due to inflammation from carrying heavy bags. Differential diagnosis includes bursitis due to inflammation. - Prescribed steroid pack for inflammation. - Prescribed 600 mg ibuprofen  with food for pain and inflammation. - Referred to orthopedic specialist, Dr. Margrette, for potential steroid injection if no improvement. - Advised gentle stretching exercises to prevent muscle pulling and improve mobility. - Discussed scheduling orthopedic consult in a few weeks to assess steroid treatment effectiveness.

## 2024-05-19 ENCOUNTER — Other Ambulatory Visit: Payer: Self-pay | Admitting: Family Medicine

## 2024-05-19 DIAGNOSIS — E1165 Type 2 diabetes mellitus with hyperglycemia: Secondary | ICD-10-CM

## 2024-05-19 DIAGNOSIS — E785 Hyperlipidemia, unspecified: Secondary | ICD-10-CM

## 2024-05-30 ENCOUNTER — Other Ambulatory Visit: Payer: Self-pay | Admitting: Family Medicine

## 2024-07-16 ENCOUNTER — Ambulatory Visit: Payer: PRIVATE HEALTH INSURANCE | Admitting: Family Medicine

## 2024-07-19 ENCOUNTER — Ambulatory Visit: Payer: PRIVATE HEALTH INSURANCE | Admitting: Family Medicine

## 2024-07-30 ENCOUNTER — Ambulatory Visit (HOSPITAL_COMMUNITY): Payer: PRIVATE HEALTH INSURANCE
